# Patient Record
Sex: Female | Born: 1940
Health system: Southern US, Community
[De-identification: ages and names within clinical notes are randomized; demographics above are authoritative.]

## PROBLEM LIST (undated history)

## (undated) DIAGNOSIS — H269 Unspecified cataract: Secondary | ICD-10-CM

## (undated) DIAGNOSIS — H409 Unspecified glaucoma: Secondary | ICD-10-CM

## (undated) DIAGNOSIS — E039 Hypothyroidism, unspecified: Secondary | ICD-10-CM

## (undated) DIAGNOSIS — K52839 Microscopic colitis, unspecified: Secondary | ICD-10-CM

## (undated) DIAGNOSIS — K219 Gastro-esophageal reflux disease without esophagitis: Secondary | ICD-10-CM

## (undated) DIAGNOSIS — R42 Dizziness and giddiness: Secondary | ICD-10-CM

## (undated) DIAGNOSIS — R519 Headache, unspecified: Secondary | ICD-10-CM

## (undated) DIAGNOSIS — F419 Anxiety disorder, unspecified: Secondary | ICD-10-CM

## (undated) DIAGNOSIS — Z974 Presence of external hearing-aid: Secondary | ICD-10-CM

## (undated) DIAGNOSIS — K529 Noninfective gastroenteritis and colitis, unspecified: Secondary | ICD-10-CM

## (undated) DIAGNOSIS — R51 Headache: Secondary | ICD-10-CM

## (undated) HISTORY — PX: TONSILLECTOMY: SUR1361

## (undated) HISTORY — DX: Presence of external hearing-aid: Z97.4

## (undated) HISTORY — PX: CATARACT EXTRACTION: SUR2

## (undated) HISTORY — PX: KNEE SURGERY: SHX244

## (undated) HISTORY — PX: WRIST FRACTURE SURGERY: SHX121

## (undated) HISTORY — PX: WISDOM TOOTH EXTRACTION: SHX21

## (undated) HISTORY — DX: Unspecified glaucoma: H40.9

## (undated) HISTORY — PX: BIOPSY BREAST: PRO8

## (undated) HISTORY — PX: OTHER SURGICAL HISTORY: SHX169

## (undated) HISTORY — DX: Dizziness and giddiness: R42

## (undated) HISTORY — PX: SHOULDER ARTHROSCOPY: SHX128

## (undated) HISTORY — PX: ABDOMINAL HYSTERECTOMY: SHX81

## (undated) HISTORY — DX: Unspecified cataract: H26.9

---

## 1968-12-17 HISTORY — PX: OTHER SURGICAL HISTORY: SHX169

## 1998-09-14 ENCOUNTER — Ambulatory Visit (HOSPITAL_COMMUNITY): Admission: RE | Admit: 1998-09-14 | Discharge: 1998-09-14 | Payer: Self-pay | Admitting: Obstetrics & Gynecology

## 1999-01-13 ENCOUNTER — Other Ambulatory Visit: Admission: RE | Admit: 1999-01-13 | Discharge: 1999-01-13 | Payer: Self-pay | Admitting: Obstetrics and Gynecology

## 2000-02-06 ENCOUNTER — Other Ambulatory Visit: Admission: RE | Admit: 2000-02-06 | Discharge: 2000-02-06 | Payer: Self-pay | Admitting: Obstetrics and Gynecology

## 2000-11-28 ENCOUNTER — Encounter: Admission: RE | Admit: 2000-11-28 | Discharge: 2000-11-28 | Payer: Self-pay | Admitting: Obstetrics and Gynecology

## 2000-11-28 ENCOUNTER — Encounter: Payer: Self-pay | Admitting: Obstetrics and Gynecology

## 2001-03-07 ENCOUNTER — Other Ambulatory Visit: Admission: RE | Admit: 2001-03-07 | Discharge: 2001-03-07 | Payer: Self-pay | Admitting: Obstetrics and Gynecology

## 2001-04-18 ENCOUNTER — Encounter: Admission: RE | Admit: 2001-04-18 | Discharge: 2001-07-16 | Payer: Self-pay | Admitting: Internal Medicine

## 2001-09-23 ENCOUNTER — Other Ambulatory Visit: Admission: RE | Admit: 2001-09-23 | Discharge: 2001-09-23 | Payer: Self-pay | Admitting: Otolaryngology

## 2001-10-01 ENCOUNTER — Other Ambulatory Visit: Admission: RE | Admit: 2001-10-01 | Discharge: 2001-10-01 | Payer: Self-pay | Admitting: Otolaryngology

## 2001-10-13 ENCOUNTER — Encounter: Payer: Self-pay | Admitting: Otolaryngology

## 2001-10-13 ENCOUNTER — Encounter: Admission: RE | Admit: 2001-10-13 | Discharge: 2001-10-13 | Payer: Self-pay | Admitting: Otolaryngology

## 2002-03-27 ENCOUNTER — Other Ambulatory Visit: Admission: RE | Admit: 2002-03-27 | Discharge: 2002-03-27 | Payer: Self-pay | Admitting: Obstetrics and Gynecology

## 2003-05-28 ENCOUNTER — Other Ambulatory Visit: Admission: RE | Admit: 2003-05-28 | Discharge: 2003-05-28 | Payer: Self-pay | Admitting: Obstetrics and Gynecology

## 2003-09-06 ENCOUNTER — Ambulatory Visit (HOSPITAL_COMMUNITY): Admission: RE | Admit: 2003-09-06 | Discharge: 2003-09-06 | Payer: Self-pay | Admitting: Gastroenterology

## 2005-05-17 ENCOUNTER — Encounter: Admission: RE | Admit: 2005-05-17 | Discharge: 2005-05-17 | Payer: Self-pay | Admitting: Internal Medicine

## 2006-02-11 ENCOUNTER — Encounter: Admission: RE | Admit: 2006-02-11 | Discharge: 2006-02-11 | Payer: Self-pay | Admitting: Internal Medicine

## 2007-07-16 ENCOUNTER — Encounter: Admission: RE | Admit: 2007-07-16 | Discharge: 2007-07-16 | Payer: Self-pay | Admitting: Internal Medicine

## 2007-08-21 ENCOUNTER — Encounter: Admission: RE | Admit: 2007-08-21 | Discharge: 2007-08-21 | Payer: Self-pay | Admitting: Obstetrics and Gynecology

## 2009-04-19 ENCOUNTER — Encounter: Admission: RE | Admit: 2009-04-19 | Discharge: 2009-04-19 | Payer: Self-pay | Admitting: Internal Medicine

## 2010-09-28 ENCOUNTER — Encounter: Admission: RE | Admit: 2010-09-28 | Discharge: 2010-09-28 | Payer: Self-pay | Admitting: Neurosurgery

## 2010-10-17 HISTORY — PX: BACK SURGERY: SHX140

## 2010-10-24 ENCOUNTER — Inpatient Hospital Stay (HOSPITAL_COMMUNITY): Admission: RE | Admit: 2010-10-24 | Discharge: 2010-10-28 | Payer: Self-pay | Admitting: Neurosurgery

## 2011-01-07 ENCOUNTER — Encounter: Payer: Self-pay | Admitting: Neurology

## 2011-02-27 LAB — BASIC METABOLIC PANEL
BUN: 13 mg/dL (ref 6–23)
Chloride: 106 mEq/L (ref 96–112)
GFR calc Af Amer: 56 mL/min — ABNORMAL LOW (ref >60)
Potassium: 4.7 mEq/L (ref 3.5–5.1)
Sodium: 139 mEq/L (ref 135–145)

## 2011-02-27 LAB — ABO/RH: ABO/RH(D): AB POS

## 2011-02-27 LAB — CBC
HCT: 47.2 % — ABNORMAL HIGH (ref 36.0–46.0)
Hemoglobin: 16.2 g/dL — ABNORMAL HIGH (ref 12.0–15.0)
MCV: 95.9 fL (ref 78.0–100.0)
RBC: 4.92 MIL/uL (ref 3.87–5.11)
WBC: 5.7 10*3/uL (ref 4.0–10.5)

## 2011-02-27 LAB — TYPE AND SCREEN
ABO/RH(D): AB POS
Antibody Screen: NEGATIVE

## 2011-03-15 ENCOUNTER — Ambulatory Visit (HOSPITAL_COMMUNITY)
Admission: RE | Admit: 2011-03-15 | Discharge: 2011-03-15 | Disposition: A | Discharge status: Home / Self Care | Payer: Medicare Other | Source: Ambulatory Visit | Attending: Gastroenterology | Admitting: Gastroenterology

## 2011-03-15 DIAGNOSIS — K2960 Other gastritis without bleeding: Secondary | ICD-10-CM | POA: Insufficient documentation

## 2011-03-15 DIAGNOSIS — K222 Esophageal obstruction: Secondary | ICD-10-CM | POA: Insufficient documentation

## 2011-03-27 NOTE — Op Note (Signed)
  NAMECRISTINE, Kylie Christensen NO.:  Christensen  MEDICAL RECORD NO.:  Christensen           PATIENT TYPE:  O  LOCATION:  WLEN                         FACILITY:  [facility]  PHYSICIAN:  Danise Edge, M.D.   DATE OF BIRTH:  December 01, 1941  DATE OF PROCEDURE:  03/15/2011 DATE OF DISCHARGE:                              OPERATIVE REPORT   PROCEDURE:  Esophagogastroduodenoscopy with balloon dilation of a distal esophageal stricture.  REFERRING PHYSICIAN:  Juluis Mire, M.D.  HISTORY:  Kylie Christensen is a 70 year old female born [birth year].  In 1994, the patient underwent an esophagogastroduodenoscopy with Savary dilation of a benign peptic stricture at the esophagogastric junction.  The patient has developed intermittent esophageal dysphagia.  ENDOSCOPIST:  Danise Edge, M.D.  PREMEDICATIONS: 1. Fentanyl 50 mcg. 2. Versed 7.5 mg.  DESCRIPTION OF PROCEDURE:  After obtaining informed consent, the patient was placed in the left lateral decubitus position.  The Pentax gastroscope was passed through the posterior hypopharynx into the proximal esophagus without difficulty.  The hypopharynx, larynx and vocal cords appeared normal.  ESOPHAGOSCOPY:  The proximal, mid, and lower segments of the esophageal mucosa appeared normal except for the presence of a shallow stricture at the esophagogastric junction which was noted at 37 cm from the incisor teeth.  There was no endoscopic evidence for the presence of erosive esophagitis or Barrett's esophagus.  GASTROSCOPY:  Retroflex view of the gastric, cardia, and fundus was normal.  The diaphragmatic hiatus was quite patulous.  The gastric body appeared normal.  There were scattered circular erosions in the gastric antrum probably secondary to the use of aspirin.  The pylorus was normal.  DUODENOSCOPY:  The duodenal bulb and descending duodenum appeared normal.  ESOPHAGEAL DILATION:  Using the CRE esophageal balloon  dilator, the shallow peptic stricture at the esophagogastric junction was dilated to 16.5 mm.  ASSESSMENT: 1. Scattered circular erosions in the gastric antrum probably due to     aspirin use. 2. A shallow benign peptic stricture at the esophagogastric junction     dilated with the 16.5 mm CRE balloon dilator.          ______________________________ Danise Edge, M.D.     MJ/MEDQ  D:  03/15/2011  T:  03/15/2011  Job:  [id number]  cc:   Juluis Mire, M.D. Fax: [phone]  Candyce Churn, M.D. Fax: [phone]  Electronically Signed by Danise Edge M.D. on 03/27/2011 12:14:01 PM

## 2011-05-04 NOTE — Op Note (Signed)
NAMEARVIS, MIGUEZ                          ACCOUNT NO.:  [id number]   MEDICAL RECORD NO.:  [id number]                   PATIENT TYPE:  AMB   LOCATION:  ENDO                                 FACILITY:  [facility]   PHYSICIAN:  Danise Edge, M.D.                DATE OF BIRTH:  [birth year]   DATE OF PROCEDURE:  09/06/2003  DATE OF DISCHARGE:                                 OPERATIVE REPORT   PROCEDURE:  Esophagogastroduodenoscopy with Savary esophageal dilation.   PROCEDURE INDICATION:  Ms. Sonal Dorwart is a 70 year old female born  Jul 12, 1941.  Ms. Rolland is experiencing cervical esophageal dysphagia  with associated hoarseness.  She rarely experiences heartburn.  She has  undergone thyroid surgery to remove a goiter in the past.   ENDOSCOPIST:  Danise Edge, M.D.   PREMEDICATION:  Versed 5 mg, Demerol 50 mg.   DESCRIPTION OF PROCEDURE:  After obtaining informed consent, Ms. Krotz was  placed in the left lateral decubitus position.  I administered intravenous  Demerol and intravenous Versed to achieve conscious sedation for the  procedure.  The patient's blood pressure, oxygen saturation, and cardiac  rhythm were monitored throughout the procedure and documented in the medical  record.   The Olympus gastroscope was passed through the posterior hypopharynx into  the proximal esophagus without difficulty.  Visualization of the  hypopharynx, larynx, and vocal cords was normal by exam.   Esophagoscopy:  The proximal, mid-, and lower segments of the esophageal  mucosa appear completely normal.  There is no endoscopic evidence for the  presence of esophageal mucosal scarring, esophageal narrowing, or esophageal  obstruction.   Gastroscopy:  Retroflexed view of the gastric cardia and fundus was normal.  Endoscopically there is no hiatal hernia.  The gastric body appears normal.  There are scattered superficial erosions in the gastric antrum.  The pylorus  appears  normal.   Duodenoscopy:  The duodenal bulb, mid-duodenum, and distal duodenum appear  normal.   Savary esophageal dilation without fluoroscopy:  The Savary dilator wire was  passed through the endoscope and the tip of the guidewire advanced to the  distal gastric antrum as confirmed endoscopically.  The 14 mm Savary dilator  was passed without resistance.  Repeat esophagogastroscopy revealed a  minimal amount of blood at the esophagogastric junction, possibly  representing a dilated superficial stricture and no gastric trauma due to  the guidewire.    ASSESSMENT:  Normal esophagogastroduodenoscopy.  A 14 mm Savary dilator  passed without resistance; there may have been a superficial stricture at  the esophagogastric junction, which was dilated.                                               Danise Edge, M.D.    MJ/MEDQ  D:  09/06/2003  T:  09/06/2003  Job:  [id number]   cc:   Candyce Churn, M.D.  [address] [place]  [place] [address]  Fax: [phone]

## 2011-07-18 ENCOUNTER — Other Ambulatory Visit (HOSPITAL_COMMUNITY): Payer: Self-pay | Admitting: Neurosurgery

## 2011-07-18 DIAGNOSIS — M4310 Spondylolisthesis, site unspecified: Secondary | ICD-10-CM

## 2011-08-02 ENCOUNTER — Ambulatory Visit (HOSPITAL_COMMUNITY)
Admission: RE | Admit: 2011-08-02 | Discharge: 2011-08-02 | Disposition: A | Discharge status: Home / Self Care | Payer: Medicare Other | Source: Ambulatory Visit | Attending: Neurosurgery | Admitting: Neurosurgery

## 2011-08-02 DIAGNOSIS — M4310 Spondylolisthesis, site unspecified: Secondary | ICD-10-CM

## 2011-11-13 ENCOUNTER — Other Ambulatory Visit (HOSPITAL_COMMUNITY): Payer: Self-pay | Admitting: Neurosurgery

## 2011-11-13 DIAGNOSIS — M4310 Spondylolisthesis, site unspecified: Secondary | ICD-10-CM

## 2011-11-16 ENCOUNTER — Ambulatory Visit (HOSPITAL_COMMUNITY)
Admission: RE | Admit: 2011-11-16 | Discharge: 2011-11-16 | Disposition: A | Discharge status: Home / Self Care | Payer: Self-pay | Source: Ambulatory Visit | Attending: Neurosurgery | Admitting: Neurosurgery

## 2011-11-16 DIAGNOSIS — M5126 Other intervertebral disc displacement, lumbar region: Secondary | ICD-10-CM | POA: Insufficient documentation

## 2011-11-16 DIAGNOSIS — Z09 Encounter for follow-up examination after completed treatment for conditions other than malignant neoplasm: Secondary | ICD-10-CM | POA: Insufficient documentation

## 2011-11-16 DIAGNOSIS — M4310 Spondylolisthesis, site unspecified: Secondary | ICD-10-CM | POA: Insufficient documentation

## 2011-11-16 DIAGNOSIS — M519 Unspecified thoracic, thoracolumbar and lumbosacral intervertebral disc disorder: Secondary | ICD-10-CM | POA: Insufficient documentation

## 2012-10-29 ENCOUNTER — Other Ambulatory Visit (HOSPITAL_COMMUNITY)
Admission: RE | Admit: 2012-10-29 | Discharge: 2012-10-29 | Disposition: A | Discharge status: Home / Self Care | Payer: Medicare Other | Source: Ambulatory Visit | Attending: Internal Medicine | Admitting: Internal Medicine

## 2012-10-29 ENCOUNTER — Other Ambulatory Visit: Payer: Self-pay | Admitting: Internal Medicine

## 2012-10-29 DIAGNOSIS — Z1151 Encounter for screening for human papillomavirus (HPV): Secondary | ICD-10-CM | POA: Insufficient documentation

## 2012-10-29 DIAGNOSIS — Z124 Encounter for screening for malignant neoplasm of cervix: Secondary | ICD-10-CM | POA: Insufficient documentation

## 2013-02-16 DIAGNOSIS — H47012 Ischemic optic neuropathy, left eye: Secondary | ICD-10-CM | POA: Insufficient documentation

## 2013-02-16 DIAGNOSIS — H04129 Dry eye syndrome of unspecified lacrimal gland: Secondary | ICD-10-CM | POA: Diagnosis present

## 2014-12-23 DIAGNOSIS — J309 Allergic rhinitis, unspecified: Secondary | ICD-10-CM | POA: Diagnosis not present

## 2014-12-23 DIAGNOSIS — E559 Vitamin D deficiency, unspecified: Secondary | ICD-10-CM | POA: Diagnosis not present

## 2014-12-23 DIAGNOSIS — G43009 Migraine without aura, not intractable, without status migrainosus: Secondary | ICD-10-CM | POA: Diagnosis not present

## 2014-12-23 DIAGNOSIS — F419 Anxiety disorder, unspecified: Secondary | ICD-10-CM | POA: Diagnosis not present

## 2014-12-23 DIAGNOSIS — Z0001 Encounter for general adult medical examination with abnormal findings: Secondary | ICD-10-CM | POA: Diagnosis not present

## 2014-12-23 DIAGNOSIS — Z23 Encounter for immunization: Secondary | ICD-10-CM | POA: Diagnosis not present

## 2014-12-23 DIAGNOSIS — R21 Rash and other nonspecific skin eruption: Secondary | ICD-10-CM | POA: Diagnosis not present

## 2014-12-23 DIAGNOSIS — R195 Other fecal abnormalities: Secondary | ICD-10-CM | POA: Diagnosis not present

## 2015-01-05 DIAGNOSIS — R21 Rash and other nonspecific skin eruption: Secondary | ICD-10-CM | POA: Diagnosis not present

## 2015-01-05 DIAGNOSIS — R197 Diarrhea, unspecified: Secondary | ICD-10-CM | POA: Diagnosis not present

## 2015-01-05 DIAGNOSIS — E039 Hypothyroidism, unspecified: Secondary | ICD-10-CM | POA: Diagnosis not present

## 2015-01-05 DIAGNOSIS — E042 Nontoxic multinodular goiter: Secondary | ICD-10-CM | POA: Diagnosis not present

## 2015-01-19 ENCOUNTER — Other Ambulatory Visit: Payer: Self-pay | Admitting: Gastroenterology

## 2015-01-19 DIAGNOSIS — K529 Noninfective gastroenteritis and colitis, unspecified: Secondary | ICD-10-CM | POA: Diagnosis not present

## 2015-01-19 DIAGNOSIS — Z8719 Personal history of other diseases of the digestive system: Secondary | ICD-10-CM | POA: Diagnosis not present

## 2015-01-20 ENCOUNTER — Encounter (HOSPITAL_COMMUNITY): Payer: Self-pay | Admitting: *Deleted

## 2015-01-25 ENCOUNTER — Ambulatory Visit (HOSPITAL_COMMUNITY): Payer: Commercial Managed Care - HMO | Admitting: Anesthesiology

## 2015-01-25 ENCOUNTER — Encounter (HOSPITAL_COMMUNITY)
Admission: RE | Disposition: A | Discharge status: Home / Self Care | Payer: Self-pay | Source: Ambulatory Visit | Attending: Gastroenterology

## 2015-01-25 ENCOUNTER — Encounter (HOSPITAL_COMMUNITY): Payer: Self-pay | Admitting: Certified Registered"

## 2015-01-25 ENCOUNTER — Ambulatory Visit (HOSPITAL_COMMUNITY)
Admission: RE | Admit: 2015-01-25 | Discharge: 2015-01-25 | Disposition: A | Discharge status: Home / Self Care | Payer: Commercial Managed Care - HMO | Source: Ambulatory Visit | Attending: Gastroenterology | Admitting: Gastroenterology

## 2015-01-25 DIAGNOSIS — K449 Diaphragmatic hernia without obstruction or gangrene: Secondary | ICD-10-CM | POA: Insufficient documentation

## 2015-01-25 DIAGNOSIS — Z90710 Acquired absence of both cervix and uterus: Secondary | ICD-10-CM | POA: Diagnosis not present

## 2015-01-25 DIAGNOSIS — E039 Hypothyroidism, unspecified: Secondary | ICD-10-CM | POA: Diagnosis not present

## 2015-01-25 DIAGNOSIS — Z8719 Personal history of other diseases of the digestive system: Secondary | ICD-10-CM | POA: Diagnosis not present

## 2015-01-25 DIAGNOSIS — G43909 Migraine, unspecified, not intractable, without status migrainosus: Secondary | ICD-10-CM | POA: Diagnosis not present

## 2015-01-25 DIAGNOSIS — M1990 Unspecified osteoarthritis, unspecified site: Secondary | ICD-10-CM | POA: Diagnosis not present

## 2015-01-25 DIAGNOSIS — Z881 Allergy status to other antibiotic agents status: Secondary | ICD-10-CM | POA: Insufficient documentation

## 2015-01-25 DIAGNOSIS — K5289 Other specified noninfective gastroenteritis and colitis: Secondary | ICD-10-CM | POA: Diagnosis not present

## 2015-01-25 DIAGNOSIS — Z888 Allergy status to other drugs, medicaments and biological substances status: Secondary | ICD-10-CM | POA: Insufficient documentation

## 2015-01-25 DIAGNOSIS — Z981 Arthrodesis status: Secondary | ICD-10-CM | POA: Insufficient documentation

## 2015-01-25 DIAGNOSIS — K529 Noninfective gastroenteritis and colitis, unspecified: Secondary | ICD-10-CM | POA: Diagnosis not present

## 2015-01-25 DIAGNOSIS — K219 Gastro-esophageal reflux disease without esophagitis: Secondary | ICD-10-CM | POA: Diagnosis not present

## 2015-01-25 DIAGNOSIS — K222 Esophageal obstruction: Secondary | ICD-10-CM | POA: Insufficient documentation

## 2015-01-25 HISTORY — PX: COLONOSCOPY WITH PROPOFOL: SHX5780

## 2015-01-25 HISTORY — PX: BALLOON DILATION: SHX5330

## 2015-01-25 HISTORY — DX: Gastro-esophageal reflux disease without esophagitis: K21.9

## 2015-01-25 HISTORY — DX: Headache: R51

## 2015-01-25 HISTORY — DX: Headache, unspecified: R51.9

## 2015-01-25 HISTORY — PX: ESOPHAGOGASTRODUODENOSCOPY (EGD) WITH PROPOFOL: SHX5813

## 2015-01-25 HISTORY — DX: Anxiety disorder, unspecified: F41.9

## 2015-01-25 HISTORY — DX: Hypothyroidism, unspecified: E03.9

## 2015-01-25 SURGERY — COLONOSCOPY WITH PROPOFOL
Anesthesia: Monitor Anesthesia Care

## 2015-01-25 MED ORDER — SODIUM CHLORIDE 0.9 % IV SOLN: INTRAVENOUS | Status: DC

## 2015-01-25 MED ORDER — PROPOFOL 10 MG/ML IV BOLUS
INTRAVENOUS | Status: DC | PRN
  Administered 2015-01-25: 100 mg via INTRAVENOUS
  Administered 2015-01-25: 50 mg via INTRAVENOUS
  Administered 2015-01-25: 100 mg via INTRAVENOUS
  Administered 2015-01-25: 50 mg via INTRAVENOUS
  Administered 2015-01-25: 100 mg via INTRAVENOUS

## 2015-01-25 MED ORDER — PROPOFOL 10 MG/ML IV BOLUS
INTRAVENOUS | Status: AC
  Filled 2015-01-25: qty 20

## 2015-01-25 MED ORDER — LIDOCAINE HCL (PF) 2 % IJ SOLN
INTRAMUSCULAR | Status: DC | PRN
  Administered 2015-01-25: 20 mg via INTRADERMAL

## 2015-01-25 MED ORDER — LACTATED RINGERS IV SOLN
INTRAVENOUS | Status: DC | PRN
  Administered 2015-01-25: 11:00:00 via INTRAVENOUS

## 2015-01-25 MED ORDER — LIDOCAINE HCL (CARDIAC) 20 MG/ML IV SOLN
INTRAVENOUS | Status: AC
  Filled 2015-01-25: qty 5

## 2015-01-25 SURGICAL SUPPLY — 25 items

## 2015-01-25 NOTE — Op Note (Signed)
Problems: Chronic watery diarrhea. Benign distal esophageal stricture. Serologic screen for celiac disease normal. 03/15/2011 esophagogastroduodenoscopy with benign distal esophageal stricture dilation. 09/27/2008 normal colonoscopy with inspection of the terminal ileum and terminal ileal biopsies.  Endoscopist: Earle Gell  Premedication: Propofol administered by anesthesia  Procedure: Esophagogastroduodenoscopy. Balloon dilation of the benign stricture at the esophagogastric junction. Small bowel biopsies to look for villous atrophy associated with celiac disease. The patient was placed in the left lateral decubitus position. The Pentax gastroscope was passed through the posterior hypopharynx into the proximal esophagus without difficulty. The hypopharynx, larynx, and vocal cords appeared normal.  Esophagoscopy: The proximal and mid segments of the esophageal mucosa appeared normal. At the squamocolumnar junction noted at 37 cm from the incisor teeth was a benign distal esophageal stricture extending less than 1 cm in length. I was easily able to traverse the stricture with the Pentax gastroscope and intravenous stomach. The distal esophageal mucosa otherwise appeared normal. Using the esophageal balloon dilator, the stricture was dilated from 15 mm to 18 mm without apparent complications.  Gastroscopy: There was a moderate sized hiatal hernia. Retroflexed view of the gastric cardia and fundus was normal. The gastric body, antrum, and pylorus appeared normal.  Duodenoscopy: The duodenal bulb and descending duodenum appeared normal. Four biopsies were taken from the second portion of the duodenum and two biopsies were taken from the duodenal bulb to look for villous atrophy associated with celiac disease.  Assessment: Gastroesophageal reflux associated with a moderate-sized hiatal hernia and complicated by a benign stricture at the esophagogastric junction which was dilated from 15 mm to 18 mm  using the esophageal balloon dilator. Duodenal biopsies were performed to look for villous atrophy associated with celiac disease.  Procedure: Diagnostic colonoscopy with random colon biopsies to look for microscopic colitis Anal inspection and digital rectal exam were normal. The Pentax pediatric colonoscope was introduced into the rectum and advanced to the cecum. A normal-appearing appendiceal orifice and ileocecal valve were identified. Colonic preparation for the exam today was good. Withdrawal time was 9 minutes  Rectum. Normal. Retroflexed view of the distal rectum normal.  Sigmoid colon and descending colon. Normal  Splenic flexure. Normal  Transverse colon. Normal  Hepatic flexure. Normal  Ascending colon. Normal  Cecum and ileocecal valve. Normal  Biopsies: Four biopsies were taken from the ascending colon and two biopsies were taken from the descending colon to look for microscopic colitis  Assessment: Normal diagnostic colonoscopy. Random colon biopsies to look for microscopic colitis pending.

## 2015-01-25 NOTE — Anesthesia Preprocedure Evaluation (Signed)
Anesthesia Evaluation  Patient identified by MRN, date of birth, ID band Patient awake    Reviewed: Allergy & Precautions, NPO status , Patient's Chart, lab work & pertinent test results  Airway Mallampati: II  TM Distance: >3 FB Neck ROM: Full    Dental no notable dental hx.    Pulmonary neg pulmonary ROS,  breath sounds clear to auscultation  Pulmonary exam normal       Cardiovascular Exercise Tolerance: Good negative cardio ROS  Rhythm:Regular Rate:Normal     Neuro/Psych negative neurological ROS  negative psych ROS   GI/Hepatic negative GI ROS, Neg liver ROS,   Endo/Other  negative endocrine ROS  Renal/GU negative Renal ROS  negative genitourinary   Musculoskeletal negative musculoskeletal ROS (+)   Abdominal   Peds negative pediatric ROS (+)  Hematology negative hematology ROS (+)   Anesthesia Other Findings   Reproductive/Obstetrics negative OB ROS                             Anesthesia Physical Anesthesia Plan  ASA: II  Anesthesia Plan: MAC   Post-op Pain Management:    Induction: Intravenous  Airway Management Planned:   Additional Equipment:   Intra-op Plan:   Post-operative Plan:   Informed Consent: I have reviewed the patients History and Physical, chart, labs and discussed the procedure including the risks, benefits and alternatives for the proposed anesthesia with the patient or authorized representative who has indicated his/her understanding and acceptance.   Dental advisory given  Plan Discussed with: CRNA  Anesthesia Plan Comments:         Anesthesia Quick Evaluation

## 2015-01-25 NOTE — Discharge Instructions (Signed)
Colonoscopy, Care After °These instructions give you information on caring for yourself after your procedure. Your doctor may also give you more specific instructions. Call your doctor if you have any problems or questions after your procedure. °HOME CARE °· Do not drive for 24 hours. °· Do not sign important papers or use machinery for 24 hours. °· You may shower. °· You may go back to your usual activities, but go slower for the first 24 hours. °· Take rest breaks often during the first 24 hours. °· Walk around or use warm packs on your belly (abdomen) if you have belly cramping or gas. °· Drink enough fluids to keep your pee (urine) clear or pale yellow. °· Resume your normal diet. Avoid heavy or fried foods. °· Avoid drinking alcohol for 24 hours or as told by your doctor. °· Only take medicines as told by your doctor. °If a tissue sample (biopsy) was taken during the procedure:  °· Do not take aspirin or blood thinners for 7 days, or as told by your doctor. °· Do not drink alcohol for 7 days, or as told by your doctor. °· Eat soft foods for the first 24 hours. °GET HELP IF: °You still have a small amount of blood in your poop (stool) 2-3 days after the procedure. °GET HELP RIGHT AWAY IF: °· You have more than a small amount of blood in your poop. °· You see clumps of tissue (blood clots) in your poop. °· Your belly is puffy (swollen). °· You feel sick to your stomach (nauseous) or throw up (vomit). °· You have a fever. °· You have belly pain that gets worse and medicine does not help. °MAKE SURE YOU: °· Understand these instructions. °· Will watch your condition. °· Will get help right away if you are not doing well or get worse. °Document Released: 01/05/2011 Document Revised: 12/08/2013 Document Reviewed: 08/10/2013 °Exit[employer]® Patient Information ©2015 [employer]. This information is not intended to replace advice given to you by your health care provider. Make sure you discuss any questions you have with  your health care provider. ° ° °Esophagogastroduodenoscopy °Care After °Refer to this sheet in the next few weeks. These instructions provide you with information on caring for yourself after your procedure. Your caregiver may also give you more specific instructions. Your treatment has been planned according to current medical practices, but problems sometimes occur. Call your caregiver if you have any problems or questions after your procedure.  °HOME CARE INSTRUCTIONS °· Do not eat or drink anything until the numbing medicine (local anesthetic) has worn off and your gag reflex has returned. You will know that the local anesthetic has worn off when you can swallow comfortably. °· Do not drive for 12 hours after the procedure or as directed by your caregiver. °· Only take medicines as directed by your caregiver. °SEEK MEDICAL CARE IF:  °· You cannot stop coughing. °· You are not urinating at all or less than usual. °SEEK IMMEDIATE MEDICAL CARE IF: °· You have difficulty swallowing. °· You cannot eat or drink. °· You have worsening throat or chest pain. °· You have dizziness, lightheadedness, or you faint. °· You have nausea or vomiting. °· You have chills. °· You have a fever. °· You have severe abdominal pain. °· You have black, tarry, or bloody stools. °Document Released: 11/19/2012 Document Reviewed: 11/19/2012 °[employer]® Patient Information ©2015 [employer]. This information is not intended to replace advice given to you by your health care provider. Make   sure you discuss any questions you have with your health care provider. ° ° °Conscious Sedation, Adult, Care After °Refer to this sheet in the next few weeks. These instructions provide you with information on caring for yourself after your procedure. Your health care provider may also give you more specific instructions. Your treatment has been planned according to current medical practices, but problems sometimes occur. Call your health care provider if  you have any problems or questions after your procedure. °WHAT TO EXPECT AFTER THE PROCEDURE  °After your procedure: °· You may feel sleepy, clumsy, and have poor balance for several hours. °· Vomiting may occur if you eat too soon after the procedure. °HOME CARE INSTRUCTIONS °· Do not participate in any activities where you could become injured for at least 24 hours. Do not: °¨ Drive. °¨ Swim. °¨ Ride a bicycle. °¨ Operate heavy machinery. °¨ Cook. °¨ Use power tools. °¨ Climb ladders. °¨ Work from a high place. °· Do not make important decisions or sign legal documents until you are improved. °· If you vomit, drink water, juice, or soup when you can drink without vomiting. Make sure you have little or no nausea before eating solid foods. °· Only take over-the-counter or prescription medicines for pain, discomfort, or fever as directed by your health care provider. °· Make sure you and your family fully understand everything about the medicines given to you, including what side effects may occur. °· You should not drink alcohol, take sleeping pills, or take medicines that cause drowsiness for at least 24 hours. °· If you smoke, do not smoke without supervision. °· If you are feeling better, you may resume normal activities 24 hours after you were sedated. °· Keep all appointments with your health care provider. °SEEK MEDICAL CARE IF: °· Your skin is pale or bluish in color. °· You continue to feel nauseous or vomit. °· Your pain is getting worse and is not helped by medicine. °· You have bleeding or swelling. °· You are still sleepy or feeling clumsy after 24 hours. °SEEK IMMEDIATE MEDICAL CARE IF: °· You develop a rash. °· You have difficulty breathing. °· You develop any type of allergic problem. °· You have a fever. °MAKE SURE YOU: °· Understand these instructions. °· Will watch your condition. °· Will get help right away if you are not doing well or get worse. °Document Released: 09/23/2013 Document Reviewed:  09/23/2013 °ExitCare® Patient Information ©2015 [employer]. This information is not intended to replace advice given to you by your health care provider. Make sure you discuss any questions you have with your health care provider. ° °

## 2015-01-25 NOTE — Transfer of Care (Signed)
Immediate Anesthesia Transfer of Care Note  Patient: Kylie Christensen  Procedure(s) Performed: Procedure(s) (LRB): COLONOSCOPY WITH PROPOFOL (N/A) ESOPHAGOGASTRODUODENOSCOPY (EGD) WITH PROPOFOL (N/A) BALLOON DILATION (N/A)  Patient Location: PACU  Anesthesia Type: MAC  Level of Consciousness: sedated, patient cooperative and responds to stimulation  Airway & Oxygen Therapy: Patient Spontanous Breathing and Patient connected to face mask oxgen  Post-op Assessment: Report given to PACU RN and Post -op Vital signs reviewed and stable  Post vital signs: Reviewed and stable  Complications: No apparent anesthesia complications

## 2015-01-25 NOTE — Anesthesia Postprocedure Evaluation (Signed)
  Anesthesia Post-op Note  Patient: Kylie Christensen  Procedure(s) Performed: Procedure(s) (LRB): COLONOSCOPY WITH PROPOFOL (N/A) ESOPHAGOGASTRODUODENOSCOPY (EGD) WITH PROPOFOL (N/A) BALLOON DILATION (N/A)  Patient Location: PACU  Anesthesia Type: MAC  Level of Consciousness: awake and alert   Airway and Oxygen Therapy: Patient Spontanous Breathing  Post-op Pain: mild  Post-op Assessment: Post-op Vital signs reviewed, Patient's Cardiovascular Status Stable, Respiratory Function Stable, Patent Airway and No signs of Nausea or vomiting  Last Vitals:  Filed Vitals:   01/25/15 1140  BP: 129/73  Pulse: 61  Temp:   Resp: 14    Post-op Vital Signs: stable   Complications: No apparent anesthesia complications

## 2015-01-25 NOTE — H&P (Signed)
  Problems: Chronic watery diarrhea on Prilosec and Prozac unassociated with weight loss. Serologic screen for celiac disease normal. Benign esophageal stricture. 03/15/2011 esophagogastroduodenoscopy with esophageal stricture dilation. 09/27/2008 normal colonoscopy with terminal ileum inspection and terminal ileal biopsies. PPI therapy and SSRI therapy are associated with microscopic colitis  History: The patient is a 74 year old female born 15-Apr-1941. Her mother was diagnosed with Zollinger-Ellison syndrome. Her sister was diagnosed with celiac disease. The patient underwent a normal colonoscopy with terminal ileal biopsies in October 2009. She underwent an esophagogastroduodenoscopy with dilation of a benign distal esophageal stricture in March 2012.  Within the past year, the patient required prolonged antibiotic therapy to treat an eye  infection and received antibiotic therapy for a urinary tract infection. Stool screen for C. difficile toxin was negative.  Starting in the summer of 2015, the patient developed nonbloody diarrhea unassociated with abdominal pain or weight loss. Prior to the onset of diarrhea, the patient would have a single formed bowel movement daily. Currently, the patient experiences postprandial borborygmi followed by the passage of frequent watery diarrhea unassociated with gastrointestinal bleeding. She has been experiencing anorexia unassociated with weight loss.  Her serologic screen for celiac disease was negative. She stopped taking proton pump inhibitor medication which did not result in  heartburn and did not decrease her diarrheal episodes. She continues taking Prozac which is also associated with microscopic colitis.  The patient is scheduled to undergo esophagogastroduodenoscopy with esophageal stricture dilation and small bowel biopsies to screen for celiac disease followed by diagnostic colonoscopy with random colon biopsies to look for microscopic  colitis.  Medication allergies: Cytomel. Effexor . Amoxicillin.  Past medical history: Gastroesophageal reflux. Multinodular goiter. Allergic rhinitis. Osteoarthritis. Migraine headache syndrome. Hypothyroidism. Insomnia. History of left optic neuritis. Thyroid nodule surgery. Left breast lumpectomy. Hysterectomy. Right rotator cuff surgery. Urethral dilation. Lumbar spine fusion surgery.  Exam: The patient is alert and lying comfortably on the endoscopy stretcher. Abdomen is soft and nontender to palpation. Lungs are clear to auscultation. Cardiac exam reveals a regular rhythm.  Plan: Proceed with esophagogastroduodenoscopy, benign esophageal stricture dilation, duodenal biopsies to screen for celiac disease, colonoscopy, and random colon biopsies to look for microscopic colitis.

## 2015-01-26 ENCOUNTER — Encounter (HOSPITAL_COMMUNITY): Payer: Self-pay | Admitting: Gastroenterology

## 2015-02-09 DIAGNOSIS — H2511 Age-related nuclear cataract, right eye: Secondary | ICD-10-CM | POA: Diagnosis not present

## 2015-02-09 DIAGNOSIS — Z961 Presence of intraocular lens: Secondary | ICD-10-CM | POA: Diagnosis not present

## 2015-02-09 DIAGNOSIS — H47012 Ischemic optic neuropathy, left eye: Secondary | ICD-10-CM | POA: Diagnosis not present

## 2015-02-09 DIAGNOSIS — H04123 Dry eye syndrome of bilateral lacrimal glands: Secondary | ICD-10-CM | POA: Diagnosis not present

## 2015-02-16 DIAGNOSIS — L219 Seborrheic dermatitis, unspecified: Secondary | ICD-10-CM | POA: Diagnosis not present

## 2015-02-16 DIAGNOSIS — L259 Unspecified contact dermatitis, unspecified cause: Secondary | ICD-10-CM | POA: Diagnosis not present

## 2015-02-16 DIAGNOSIS — L308 Other specified dermatitis: Secondary | ICD-10-CM | POA: Diagnosis not present

## 2015-03-28 DIAGNOSIS — G43009 Migraine without aura, not intractable, without status migrainosus: Secondary | ICD-10-CM | POA: Diagnosis not present

## 2015-03-28 DIAGNOSIS — L309 Dermatitis, unspecified: Secondary | ICD-10-CM | POA: Diagnosis not present

## 2015-03-28 DIAGNOSIS — K219 Gastro-esophageal reflux disease without esophagitis: Secondary | ICD-10-CM | POA: Diagnosis not present

## 2015-03-28 DIAGNOSIS — E559 Vitamin D deficiency, unspecified: Secondary | ICD-10-CM | POA: Diagnosis not present

## 2015-03-28 DIAGNOSIS — G4710 Hypersomnia, unspecified: Secondary | ICD-10-CM | POA: Diagnosis not present

## 2015-03-28 DIAGNOSIS — M179 Osteoarthritis of knee, unspecified: Secondary | ICD-10-CM | POA: Diagnosis not present

## 2015-03-28 DIAGNOSIS — F4320 Adjustment disorder, unspecified: Secondary | ICD-10-CM | POA: Diagnosis not present

## 2015-03-28 DIAGNOSIS — J309 Allergic rhinitis, unspecified: Secondary | ICD-10-CM | POA: Diagnosis not present

## 2015-04-22 DIAGNOSIS — R319 Hematuria, unspecified: Secondary | ICD-10-CM | POA: Diagnosis not present

## 2015-04-23 ENCOUNTER — Emergency Department (HOSPITAL_BASED_OUTPATIENT_CLINIC_OR_DEPARTMENT_OTHER): Payer: Commercial Managed Care - HMO

## 2015-04-23 ENCOUNTER — Emergency Department (HOSPITAL_BASED_OUTPATIENT_CLINIC_OR_DEPARTMENT_OTHER)
Admission: EM | Admit: 2015-04-23 | Discharge: 2015-04-23 | Disposition: A | Discharge status: Home / Self Care | Payer: Commercial Managed Care - HMO | Attending: Emergency Medicine | Admitting: Emergency Medicine

## 2015-04-23 ENCOUNTER — Encounter (HOSPITAL_BASED_OUTPATIENT_CLINIC_OR_DEPARTMENT_OTHER): Payer: Self-pay

## 2015-04-23 DIAGNOSIS — S0990XA Unspecified injury of head, initial encounter: Secondary | ICD-10-CM | POA: Insufficient documentation

## 2015-04-23 DIAGNOSIS — M79642 Pain in left hand: Secondary | ICD-10-CM | POA: Diagnosis not present

## 2015-04-23 DIAGNOSIS — Y9289 Other specified places as the place of occurrence of the external cause: Secondary | ICD-10-CM | POA: Insufficient documentation

## 2015-04-23 DIAGNOSIS — Z792 Long term (current) use of antibiotics: Secondary | ICD-10-CM | POA: Diagnosis not present

## 2015-04-23 DIAGNOSIS — E039 Hypothyroidism, unspecified: Secondary | ICD-10-CM | POA: Diagnosis not present

## 2015-04-23 DIAGNOSIS — Z79899 Other long term (current) drug therapy: Secondary | ICD-10-CM | POA: Diagnosis not present

## 2015-04-23 DIAGNOSIS — S0181XA Laceration without foreign body of other part of head, initial encounter: Secondary | ICD-10-CM | POA: Diagnosis not present

## 2015-04-23 DIAGNOSIS — S82042A Displaced comminuted fracture of left patella, initial encounter for closed fracture: Secondary | ICD-10-CM | POA: Diagnosis not present

## 2015-04-23 DIAGNOSIS — W01198A Fall on same level from slipping, tripping and stumbling with subsequent striking against other object, initial encounter: Secondary | ICD-10-CM | POA: Insufficient documentation

## 2015-04-23 DIAGNOSIS — S01511A Laceration without foreign body of lip, initial encounter: Secondary | ICD-10-CM | POA: Diagnosis not present

## 2015-04-23 DIAGNOSIS — Y998 Other external cause status: Secondary | ICD-10-CM | POA: Insufficient documentation

## 2015-04-23 DIAGNOSIS — Z880 Allergy status to penicillin: Secondary | ICD-10-CM | POA: Diagnosis not present

## 2015-04-23 DIAGNOSIS — S6992XA Unspecified injury of left wrist, hand and finger(s), initial encounter: Secondary | ICD-10-CM | POA: Diagnosis not present

## 2015-04-23 DIAGNOSIS — Z8719 Personal history of other diseases of the digestive system: Secondary | ICD-10-CM | POA: Diagnosis not present

## 2015-04-23 DIAGNOSIS — M79644 Pain in right finger(s): Secondary | ICD-10-CM | POA: Diagnosis not present

## 2015-04-23 DIAGNOSIS — S82002A Unspecified fracture of left patella, initial encounter for closed fracture: Secondary | ICD-10-CM | POA: Insufficient documentation

## 2015-04-23 DIAGNOSIS — S199XXA Unspecified injury of neck, initial encounter: Secondary | ICD-10-CM | POA: Diagnosis not present

## 2015-04-23 DIAGNOSIS — S8992XA Unspecified injury of left lower leg, initial encounter: Secondary | ICD-10-CM | POA: Diagnosis present

## 2015-04-23 DIAGNOSIS — M79641 Pain in right hand: Secondary | ICD-10-CM | POA: Diagnosis not present

## 2015-04-23 DIAGNOSIS — S6991XA Unspecified injury of right wrist, hand and finger(s), initial encounter: Secondary | ICD-10-CM | POA: Diagnosis not present

## 2015-04-23 DIAGNOSIS — M79645 Pain in left finger(s): Secondary | ICD-10-CM | POA: Diagnosis not present

## 2015-04-23 DIAGNOSIS — W19XXXA Unspecified fall, initial encounter: Secondary | ICD-10-CM

## 2015-04-23 DIAGNOSIS — F419 Anxiety disorder, unspecified: Secondary | ICD-10-CM | POA: Diagnosis not present

## 2015-04-23 DIAGNOSIS — Y9302 Activity, running: Secondary | ICD-10-CM | POA: Insufficient documentation

## 2015-04-23 HISTORY — DX: Noninfective gastroenteritis and colitis, unspecified: K52.9

## 2015-04-23 MED ORDER — ONDANSETRON 4 MG PO TBDP
ORAL_TABLET | ORAL | Status: AC
  Filled 2015-04-23: qty 1

## 2015-04-23 MED ORDER — HYDROCODONE-ACETAMINOPHEN 5-325 MG PO TABS: 1.0000 | ORAL_TABLET | Freq: Four times a day (QID) | ORAL | Status: DC | PRN

## 2015-04-23 MED ORDER — LIDOCAINE-EPINEPHRINE (PF) 2 %-1:200000 IJ SOLN
10.0000 mL | Freq: Once | INTRAMUSCULAR | Status: DC
  Filled 2015-04-23: qty 10

## 2015-04-23 MED ORDER — HYDROCODONE-ACETAMINOPHEN 5-325 MG PO TABS
1.0000 | ORAL_TABLET | Freq: Once | ORAL | Status: AC
  Administered 2015-04-23: 1 via ORAL
  Filled 2015-04-23: qty 1

## 2015-04-23 MED ORDER — ONDANSETRON HCL 8 MG PO TABS
4.0000 mg | ORAL_TABLET | Freq: Once | ORAL | Status: AC
  Administered 2015-04-23: 4 mg via ORAL

## 2015-04-23 NOTE — ED Notes (Signed)
Pt reports was running to restroom in store and fell when she got in restroom, mechanical fall onto L side, hit R side of face on door.  Pain mostly in L knee and face.  Has had severe nausea since this time.  No loc, not on blood thinners.

## 2015-04-23 NOTE — ED Notes (Signed)
Patient transported to X-ray via wheelchair per tech.

## 2015-04-23 NOTE — ED Provider Notes (Signed)
CSN: [id number]     Arrival date & time 04/23/15  1222 History   First MD Initiated Contact with Patient 04/23/15 1318     Chief Complaint  Patient presents with  . Fall     (Consider location/radiation/quality/duration/timing/severity/associated sxs/prior Treatment) HPI  Kylie Christensen is a 74 y.o. female with PMH of hypothyroidism, GERD, headache, anxiety, colitis presenting with fall and running to the restroom. Patient stated it was witnessed and she did not loss consciousness. She slipped and fell on her left side and hit her right face and fell mostly on her left knee. Patient endorses nausea but no vomiting. No headache or visual changes, slurred speech, weakness. No neck pain. Pain to left knee worse with movement. She had associated swelling. She has not taken anything for her symptoms. Pt states her tetanus is up-to-date last 2-3 years. No blood thinners no neck pain. Pt reports bilateral hand pain where she caught herself.    Past Medical History  Diagnosis Date  . Hypothyroidism   . Anxiety   . GERD (gastroesophageal reflux disease)   . Headache   . Colitis    Past Surgical History  Procedure Laterality Date  . Back surgery  nov 2011    lower back  . Thyroid nodule removed  1970  . Abdominal hysterectomy      partial  . Tonsillectomy  age 80  . Biopsy breast Left yrs ago    benign  . Rotator cuffr Right   . Colonoscopy with propofol N/A 01/25/2015    Procedure: COLONOSCOPY WITH PROPOFOL;  Surgeon: Garlan Fair, MD;  Location: [facility];  Service: Endoscopy;  Laterality: N/A;  . Esophagogastroduodenoscopy (egd) with propofol N/A 01/25/2015    Procedure: ESOPHAGOGASTRODUODENOSCOPY (EGD) WITH PROPOFOL;  Surgeon: Garlan Fair, MD;  Location: [facility];  Service: Endoscopy;  Laterality: N/A;  . Balloon dilation N/A 01/25/2015    Procedure: BALLOON DILATION;  Surgeon: Garlan Fair, MD;  Location: [facility];  Service: Endoscopy;  Laterality: N/A;   No  family history on file. History  Substance Use Topics  . Smoking status: Never Smoker   . Smokeless tobacco: Never Used  . Alcohol Use: Yes     Comment: occasional   OB History    No data available     Review of Systems 10 Systems reviewed and are negative for acute change except as noted in the HPI.    Allergies  Amoxicillin  Home Medications   Prior to Admission medications   Medication Sig Start Date End Date Taking? Authorizing Provider  cycloSPORINE (RESTASIS) 0.05 % ophthalmic emulsion 1 drop 2 (two) times daily.   Yes Historical Provider, MD  doxycycline (DORYX) 100 MG EC tablet Take 100 mg by mouth 2 (two) times daily.   Yes Historical Provider, MD  Estradiol 10 MCG TABS vaginal tablet Place 10 mcg vaginally 2 (two) times a week.    Historical Provider, MD  FLUoxetine (PROZAC) 20 MG capsule Take 20 mg by mouth daily.    Historical Provider, MD  HYDROcodone-acetaminophen (NORCO/VICODIN) 5-325 MG per tablet Take 1-2 tablets by mouth every 6 (six) hours as needed. 04/23/15   Al Corpus, PA-C  levothyroxine (SYNTHROID, LEVOTHROID) 50 MCG tablet Take 25-50 mcg by mouth every other day. Alternates between 24mcg and 50 mcg. Brand name only    Historical Provider, MD  Polyvinyl Alcohol-Povidone (REFRESH OP) Apply 1 drop to eye 3 (three) times daily as needed (Dry eyes).    Historical Provider, MD  Skin Protectants, Misc. (EUCERIN) cream Apply 1 application topically daily as needed for dry skin.    Historical Provider, MD  tazarotene (TAZORAC) 0.05 % cream Apply 1 application topically daily.    Historical Provider, MD   BP 128/71 mmHg  Pulse 74  Temp(Src) 98.5 F (36.9 C) (Oral)  Resp 18  Ht 5' 3.5" (1.613 m)  Wt 179 lb (81.194 kg)  BMI 31.21 kg/m2  SpO2 98% Physical Exam  Constitutional: She appears well-developed and well-nourished. No distress.  HENT:  Head: Normocephalic.  Mouth/Throat: Oropharynx is clear and moist.  0.5 cm to left lower chin without  involvement of [place] border. Laceration is not through and through. 1 cm laceration to inner mid lower lip  Eyes: Conjunctivae and EOM are normal. Pupils are equal, round, and reactive to light. Right eye exhibits no discharge. Left eye exhibits no discharge.  Neck: Normal range of motion. Neck supple.  No nuchal rigidity  Cardiovascular: Normal rate and regular rhythm.   Pulmonary/Chest: Effort normal and breath sounds normal. No respiratory distress. She has no wheezes.  Abdominal: Soft. Bowel sounds are normal. She exhibits no distension. There is no tenderness.  Musculoskeletal:  Left knee without wound or lesions with ecchymoses and diffuse tenderness with significant effusion. Patient with intact leg extension. Pt moves all toes without difficulty. No cyanosis, pallor or cold extremity. No firm compartments.  Neurological: She is alert. No cranial nerve deficit. Coordination normal.  Speech is clear and goal oriented. Peripheral visual fields intact. Strength 5/5 in upper and lower extremities. Sensation intact. Intact rapid alternating movements, finger to nose. Gait not performed due to knee.  Skin: Skin is warm and dry. She is not diaphoretic.  Nursing note and vitals reviewed.   ED Course  Procedures (including critical care time) Labs Review Labs Reviewed - No data to display  Imaging Review Ct Head Wo Contrast  04/23/2015   CLINICAL DATA:  [place] in a store restroom today onto her left side. Severe nausea since the fall. The patient hit the right side her of her face on the door.  EXAM: CT HEAD WITHOUT CONTRAST  CT CERVICAL SPINE WITHOUT CONTRAST  TECHNIQUE: Multidetector CT imaging of the head and cervical spine was performed following the standard protocol without intravenous contrast. Multiplanar CT image reconstructions of the cervical spine were also generated.  COMPARISON:  None.  FINDINGS: CT HEAD FINDINGS  Diffusely enlarged ventricles and subarachnoid spaces. Patchy white  matter low density in both cerebral hemispheres. No skull fracture, intracranial hemorrhage or paranasal sinus air-fluid levels. Mild bilateral posterior ethmoid sinus mucosal thickening.  CT CERVICAL SPINE FINDINGS  Multilevel degenerative changes. No prevertebral soft tissue swelling, fractures or subluxations.  IMPRESSION: 1. No skull fracture or intracranial hemorrhage. 2. No cervical spine fracture or subluxation. 3. Mild to moderate diffuse cerebral and cerebellar atrophy. 4. Mild chronic small vessel white matter ischemic changes in both cerebral hemispheres. 5. Multilevel cervical spine degenerative changes.   Electronically Signed   By: Claudie Revering M.D.   On: 04/23/2015 15:26   Ct Cervical Spine Wo Contrast  04/23/2015   CLINICAL DATA:  [place] in a store restroom today onto her left side. Severe nausea since the fall. The patient hit the right side her of her face on the door.  EXAM: CT HEAD WITHOUT CONTRAST  CT CERVICAL SPINE WITHOUT CONTRAST  TECHNIQUE: Multidetector CT imaging of the head and cervical spine was performed following the standard protocol without intravenous contrast. Multiplanar CT image reconstructions  of the cervical spine were also generated.  COMPARISON:  None.  FINDINGS: CT HEAD FINDINGS  Diffusely enlarged ventricles and subarachnoid spaces. Patchy white matter low density in both cerebral hemispheres. No skull fracture, intracranial hemorrhage or paranasal sinus air-fluid levels. Mild bilateral posterior ethmoid sinus mucosal thickening.  CT CERVICAL SPINE FINDINGS  Multilevel degenerative changes. No prevertebral soft tissue swelling, fractures or subluxations.  IMPRESSION: 1. No skull fracture or intracranial hemorrhage. 2. No cervical spine fracture or subluxation. 3. Mild to moderate diffuse cerebral and cerebellar atrophy. 4. Mild chronic small vessel white matter ischemic changes in both cerebral hemispheres. 5. Multilevel cervical spine degenerative changes.   Electronically  Signed   By: Claudie Revering M.D.   On: 04/23/2015 15:26   Dg Knee Complete 4 Views Left  04/23/2015   CLINICAL DATA:  Left knee pain after falling on the knee today when slipping on the tile floor of a bathroom at [employer].  EXAM: LEFT KNEE - COMPLETE 4+ VIEW  COMPARISON:  None.  FINDINGS: Transverse fracture of the mid patella. This is mildly comminuted with 1/2 patellar with of anterior displacement and posterior angulation of the distal fragment. There is an associated moderately large effusion. Mild medial joint space narrowing and associated spur formation. Minimal posterior patellar and lateral spur formation.  IMPRESSION: 1. Patellar fracture, as described above. 2. Associated moderately large effusion. 3. Degenerative changes.   Electronically Signed   By: Claudie Revering M.D.   On: 04/23/2015 13:07   Dg Hand Complete Left  04/23/2015   CLINICAL DATA:  [place] today, braced herself with her LEFT hand, pain near base of thumb, initial encounter  EXAM: LEFT HAND - COMPLETE 3+ VIEW  COMPARISON:  None  FINDINGS: Diffuse osseous demineralization.  Mild degenerative changes at first [facility] joint with joint space narrowing spur formation and subchondral cyst formation.  Scattered degenerative changes at interphalangeal joint most prominently at DIP joints of index and middle fingers and at IP joint thumb.  No acute fracture, dislocation, or bone destruction.  IMPRESSION: Scattered degenerative changes as above.  No acute abnormalities.   Electronically Signed   By: Lavonia Dana M.D.   On: 04/23/2015 14:54   Dg Hand Complete Right  04/23/2015   CLINICAL DATA:  [place] today and braced herself with her RIGHT hand, thumb pain, initial encounter  EXAM: RIGHT HAND - COMPLETE 3+ VIEW  COMPARISON:  None  FINDINGS: Diffuse osseous demineralization.  Mild degenerative changes first [facility] joint with joint space narrowing and spur formation.  Scattered degenerative changes at DIP joints of RIGHT index, middle and ring fingers as well as at  IP joint of thumb.  No acute fracture, dislocation or bone destruction.  IMPRESSION: Scattered degenerative changes.  No acute abnormalities.   Electronically Signed   By: Lavonia Dana M.D.   On: 04/23/2015 14:52     EKG Interpretation None      LACERATION REPAIR Performed by: Henli Hey Authorized by: Al Corpus Consent: Verbal consent obtained. Risks and benefits: risks, benefits and alternatives were discussed Consent given by: patient Patient identity confirmed: provided demographic data Prepped and Draped in normal sterile fashion Wound explored  Laceration Location: chin and inner mid lower lip  Laceration Length: 1cm  No Foreign Bodies seen or palpated  Anesthesia: local infiltration  Local anesthetic: lidocaine 2% with epinephrine  Anesthetic total: 4 ml  Irrigation method: syringe Amount of cleaning: standard  Skin closure: 5-0 vicryl rapide for inner lip (2 sutures). 5-0 proline for  outer chin (one suture)  Technique: simple interrupted  Patient tolerance: Patient tolerated the procedure well with no immediate complications.  Meds given in ED:  Medications  ondansetron (ZOFRAN) tablet 4 mg (4 mg Oral Given 04/23/15 1310)  ondansetron (ZOFRAN-ODT) 4 MG disintegrating tablet (  Duplicate 07/20/65 5993)  HYDROcodone-acetaminophen (NORCO/VICODIN) 5-325 MG per tablet 1 tablet (1 tablet Oral Given 04/23/15 1419)    Discharge Medication List as of 04/23/2015  4:11 PM    START taking these medications   Details  HYDROcodone-acetaminophen (NORCO/VICODIN) 5-325 MG per tablet Take 1-2 tablets by mouth every 6 (six) hours as needed., Starting 04/23/2015, Until Discontinued, Print          MDM   Final diagnoses:  Fall  Lip laceration, initial encounter  Head injury, initial encounter  Patella fracture, left, closed, initial encounter  Bilateral hand pain   Patient with mechanical fall with head injury and laceration to chin and inner lip as well as  bilateral hand pain and left knee pain. VSS. Patient's pain managed in the ED. Patient with patella fracture of left with displacement and angulation. Patient neurovascularly intact with intact extension of lower extremity. Patient placed in knee immobilizer and crutches ordered. CT head and neck without acute abnormalities. Patient denies any headache. Normal neurological exam. Patient's lip laceration repaired without complications. Patient states tetanus is up-to-date. Laceration was not through and through. Discussed soft diet as well as salt water gargles. Patient to follow-up with orthopedics for patella fracture. Norco provided. Driving and sedation precautions provided. Pt to return to ED/urgent care/PCP for wound recheck of laceration in 5 days.  Discussed return precautions with patient including infection and compartment syndrome. Discussed all results and patient verbalizes understanding and agrees with plan.     Al Corpus, PA-C 04/23/15 1951  Varney Biles, MD 04/30/15 [phone]

## 2015-04-23 NOTE — Discharge Instructions (Signed)
Return to the emergency room with worsening of symptoms, new symptoms or with symptoms that are concerning , especially severe worsening of headache, visual or speech changes, weakness in face, arms or legs OR warmth, redness, tenderness, pus, sharp increase in pain, fever, red streaking in the skin, OR unable to move left foot, blue, cold toes. Call to make follow up appointment as soon as possible with orthopedics. No weight bearing until evaluated by orthopedics. Wear the knee immobilizer at all times.  RICE: Rest, Ice (three cycles of 20 mins on, 41mins off at least twice a day), compression/brace, elevation. Heating pad works well for back pain. Norco for severe pain. Do not operate machinery, drive or drink alcohol while taking narcotics or muscle relaxers. Eat soft foods for first week. Salt water gargles after eating.  Keep wound dry and do not remove dressing for 24 hours if possible. After that, wash gently morning and night (every 12 hours) with soap and water. Use a topical antibiotic ointment and cover with a bandaid or gauze.    Do NOT use rubbing alcohol or hydrogen peroxide, do not soak the area   Present to your primary care doctor or the urgent care of your choice, or the ED for suture removal in 7-10 days.   Every attempt was made to remove foreign body (contaminants) from the wound.  However, there is always a chance that some may remain in the wound. This can  increase your risk of infection.   If you see signs of infection (warmth, redness, tenderness, pus, sharp increase in pain, fever, red streaking in the skin) immediately return to the emergency department.   After the wound heals fully, apply sunscreen for 6-12 months to minimize scarring.   Read below information and follow recommendations. Facial Laceration  A facial laceration is a cut on the face. These injuries can be painful and cause bleeding. Lacerations usually heal quickly, but they need special care to  reduce scarring. DIAGNOSIS  Your health care provider will take a medical history, ask for details about how the injury occurred, and examine the wound to determine how deep the cut is. TREATMENT  Some facial lacerations may not require closure. Others may not be able to be closed because of an increased risk of infection. The risk of infection and the chance for successful closure will depend on various factors, including the amount of time since the injury occurred. The wound may be cleaned to help prevent infection. If closure is appropriate, pain medicines may be given if needed. Your health care provider will use stitches (sutures), wound glue (adhesive), or skin adhesive strips to repair the laceration. These tools bring the skin edges together to allow for faster healing and a better cosmetic outcome. If needed, you may also be given a tetanus shot. HOME CARE INSTRUCTIONS  Only take over-the-counter or prescription medicines as directed by your health care provider.  Follow your health care provider's instructions for wound care. These instructions will vary depending on the technique used for closing the wound. For Sutures:  Keep the wound clean and dry.   If you were given a bandage (dressing), you should change it at least once a day. Also change the dressing if it becomes wet or dirty, or as directed by your health care provider.   Wash the wound with soap and water 2 times a day. Rinse the wound off with water to remove all soap. Pat the wound dry with a clean towel.  After cleaning, apply a thin layer of the antibiotic ointment recommended by your health care provider. This will help prevent infection and keep the dressing from sticking.   You may shower as usual after the first 24 hours. Do not soak the wound in water until the sutures are removed.   Get your sutures removed as directed by your health care provider. With facial lacerations, sutures should usually be taken  out after 4-5 days to avoid stitch marks.   Wait a few days after your sutures are removed before applying any makeup. For Skin Adhesive Strips:  Keep the wound clean and dry.   Do not get the skin adhesive strips wet. You may bathe carefully, using caution to keep the wound dry.   If the wound gets wet, pat it dry with a clean towel.   Skin adhesive strips will fall off on their own. You may trim the strips as the wound heals. Do not remove skin adhesive strips that are still stuck to the wound. They will fall off in time.  For Wound Adhesive:  You may briefly wet your wound in the shower or bath. Do not soak or scrub the wound. Do not swim. Avoid periods of heavy sweating until the skin adhesive has fallen off on its own. After showering or bathing, gently pat the wound dry with a clean towel.   Do not apply liquid medicine, cream medicine, ointment medicine, or makeup to your wound while the skin adhesive is in place. This may loosen the film before your wound is healed.   If a dressing is placed over the wound, be careful not to apply tape directly over the skin adhesive. This may cause the adhesive to be pulled off before the wound is healed.   Avoid prolonged exposure to sunlight or tanning lamps while the skin adhesive is in place.  The skin adhesive will usually remain in place for 5-10 days, then naturally fall off the skin. Do not pick at the adhesive film.  After Healing: Once the wound has healed, cover the wound with sunscreen during the day for 1 full year. This can help minimize scarring. Exposure to ultraviolet light in the first year will darken the scar. It can take 1-2 years for the scar to lose its redness and to heal completely.  SEEK IMMEDIATE MEDICAL CARE IF:  You have redness, pain, or swelling around the wound.   You see ayellowish-white fluid (pus) coming from the wound.   You have chills or a fever.  MAKE SURE YOU:  Understand these  instructions.  Will watch your condition.  Will get help right away if you are not doing well or get worse. Document Released: 01/10/2005 Document Revised: 09/23/2013 Document Reviewed: 07/16/2013 [facility] Patient Information 2015 [place], [place]. This information is not intended to replace advice given to you by your health care provider. Make sure you discuss any questions you have with your health care provider.  Salt Water Gargle This solution will help make your mouth and throat feel better. HOME CARE INSTRUCTIONS   Mix 1 teaspoon of salt in 8 ounces of warm water.  Gargle with this solution as much or often as you need or as directed. Swish and gargle gently if you have any sores or wounds in your mouth.  Do not swallow this mixture. Document Released: 09/06/2004 Document Revised: 02/25/2012 Document Reviewed: 01/28/2009 [facility] Patient Information 2015 [place], [place]. This information is not intended to replace advice given to you by your  health care provider. Make sure you discuss any questions you have with your health care provider.

## 2015-04-25 DIAGNOSIS — S82009A Unspecified fracture of unspecified patella, initial encounter for closed fracture: Secondary | ICD-10-CM | POA: Diagnosis not present

## 2015-04-28 DIAGNOSIS — S82032A Displaced transverse fracture of left patella, initial encounter for closed fracture: Secondary | ICD-10-CM | POA: Diagnosis not present

## 2015-04-28 DIAGNOSIS — S82002A Unspecified fracture of left patella, initial encounter for closed fracture: Secondary | ICD-10-CM | POA: Diagnosis not present

## 2015-04-28 DIAGNOSIS — Y929 Unspecified place or not applicable: Secondary | ICD-10-CM | POA: Diagnosis not present

## 2015-04-28 DIAGNOSIS — G8918 Other acute postprocedural pain: Secondary | ICD-10-CM | POA: Diagnosis not present

## 2015-04-28 DIAGNOSIS — W19XXXA Unspecified fall, initial encounter: Secondary | ICD-10-CM | POA: Diagnosis not present

## 2015-05-07 DIAGNOSIS — K5900 Constipation, unspecified: Secondary | ICD-10-CM | POA: Diagnosis not present

## 2015-05-07 DIAGNOSIS — K529 Noninfective gastroenteritis and colitis, unspecified: Secondary | ICD-10-CM | POA: Diagnosis not present

## 2015-05-07 DIAGNOSIS — W19XXXD Unspecified fall, subsequent encounter: Secondary | ICD-10-CM | POA: Diagnosis not present

## 2015-05-07 DIAGNOSIS — S82002D Unspecified fracture of left patella, subsequent encounter for closed fracture with routine healing: Secondary | ICD-10-CM | POA: Diagnosis not present

## 2015-05-11 DIAGNOSIS — W19XXXD Unspecified fall, subsequent encounter: Secondary | ICD-10-CM | POA: Diagnosis not present

## 2015-05-11 DIAGNOSIS — K5900 Constipation, unspecified: Secondary | ICD-10-CM | POA: Diagnosis not present

## 2015-05-11 DIAGNOSIS — S82002D Unspecified fracture of left patella, subsequent encounter for closed fracture with routine healing: Secondary | ICD-10-CM | POA: Diagnosis not present

## 2015-05-11 DIAGNOSIS — K529 Noninfective gastroenteritis and colitis, unspecified: Secondary | ICD-10-CM | POA: Diagnosis not present

## 2015-05-13 DIAGNOSIS — K529 Noninfective gastroenteritis and colitis, unspecified: Secondary | ICD-10-CM | POA: Diagnosis not present

## 2015-05-13 DIAGNOSIS — K5900 Constipation, unspecified: Secondary | ICD-10-CM | POA: Diagnosis not present

## 2015-05-13 DIAGNOSIS — S82002D Unspecified fracture of left patella, subsequent encounter for closed fracture with routine healing: Secondary | ICD-10-CM | POA: Diagnosis not present

## 2015-05-13 DIAGNOSIS — W19XXXD Unspecified fall, subsequent encounter: Secondary | ICD-10-CM | POA: Diagnosis not present

## 2015-05-16 DIAGNOSIS — S82002D Unspecified fracture of left patella, subsequent encounter for closed fracture with routine healing: Secondary | ICD-10-CM | POA: Diagnosis not present

## 2015-05-16 DIAGNOSIS — W19XXXD Unspecified fall, subsequent encounter: Secondary | ICD-10-CM | POA: Diagnosis not present

## 2015-05-16 DIAGNOSIS — K529 Noninfective gastroenteritis and colitis, unspecified: Secondary | ICD-10-CM | POA: Diagnosis not present

## 2015-05-16 DIAGNOSIS — K5900 Constipation, unspecified: Secondary | ICD-10-CM | POA: Diagnosis not present

## 2015-05-18 DIAGNOSIS — S82032D Displaced transverse fracture of left patella, subsequent encounter for closed fracture with routine healing: Secondary | ICD-10-CM | POA: Diagnosis not present

## 2015-05-20 DIAGNOSIS — S82002D Unspecified fracture of left patella, subsequent encounter for closed fracture with routine healing: Secondary | ICD-10-CM | POA: Diagnosis not present

## 2015-05-20 DIAGNOSIS — K5900 Constipation, unspecified: Secondary | ICD-10-CM | POA: Diagnosis not present

## 2015-05-20 DIAGNOSIS — W19XXXD Unspecified fall, subsequent encounter: Secondary | ICD-10-CM | POA: Diagnosis not present

## 2015-05-20 DIAGNOSIS — K529 Noninfective gastroenteritis and colitis, unspecified: Secondary | ICD-10-CM | POA: Diagnosis not present

## 2015-05-23 DIAGNOSIS — K529 Noninfective gastroenteritis and colitis, unspecified: Secondary | ICD-10-CM | POA: Diagnosis not present

## 2015-05-23 DIAGNOSIS — S82002D Unspecified fracture of left patella, subsequent encounter for closed fracture with routine healing: Secondary | ICD-10-CM | POA: Diagnosis not present

## 2015-05-23 DIAGNOSIS — W19XXXD Unspecified fall, subsequent encounter: Secondary | ICD-10-CM | POA: Diagnosis not present

## 2015-05-23 DIAGNOSIS — K5900 Constipation, unspecified: Secondary | ICD-10-CM | POA: Diagnosis not present

## 2015-05-31 DIAGNOSIS — W19XXXD Unspecified fall, subsequent encounter: Secondary | ICD-10-CM | POA: Diagnosis not present

## 2015-05-31 DIAGNOSIS — K529 Noninfective gastroenteritis and colitis, unspecified: Secondary | ICD-10-CM | POA: Diagnosis not present

## 2015-05-31 DIAGNOSIS — S82002D Unspecified fracture of left patella, subsequent encounter for closed fracture with routine healing: Secondary | ICD-10-CM | POA: Diagnosis not present

## 2015-05-31 DIAGNOSIS — K5900 Constipation, unspecified: Secondary | ICD-10-CM | POA: Diagnosis not present

## 2015-06-01 DIAGNOSIS — S82032S Displaced transverse fracture of left patella, sequela: Secondary | ICD-10-CM | POA: Diagnosis not present

## 2015-06-08 ENCOUNTER — Ambulatory Visit: Payer: Commercial Managed Care - HMO | Attending: Orthopedic Surgery | Admitting: Physical Therapy

## 2015-06-08 ENCOUNTER — Encounter: Payer: Self-pay | Admitting: Physical Therapy

## 2015-06-08 DIAGNOSIS — M25662 Stiffness of left knee, not elsewhere classified: Secondary | ICD-10-CM | POA: Insufficient documentation

## 2015-06-08 DIAGNOSIS — R262 Difficulty in walking, not elsewhere classified: Secondary | ICD-10-CM

## 2015-06-08 DIAGNOSIS — M25562 Pain in left knee: Secondary | ICD-10-CM | POA: Diagnosis not present

## 2015-06-08 NOTE — Therapy (Signed)
[facility] [address]  [place], [place], [address] Phone: [phone]   Fax:  [phone]  Physical Therapy Evaluation  Patient Details  Name: Kylie Christensen MRN: [id number] Date of Birth: 03/03/1941 Referring Provider:  Cherylann Ratel, PA-C  Encounter Date: 06/08/2015      PT End of Session - 06/08/15 1423    Visit Number 1   Number of Visits 12   Date for PT Re-Evaluation 07/20/15   PT Start Time 9242   PT Stop Time 1455   PT Time Calculation (min) 52 min      Past Medical History  Diagnosis Date  . Hypothyroidism   . Anxiety   . GERD (gastroesophageal reflux disease)   . Headache   . Colitis     Past Surgical History  Procedure Laterality Date  . Back surgery  nov 2011    lower back  . Thyroid nodule removed  1970  . Abdominal hysterectomy      partial  . Tonsillectomy  age 74  . Biopsy breast Left yrs ago    benign  . Rotator cuffr Right   . Colonoscopy with propofol N/A 01/25/2015    Procedure: COLONOSCOPY WITH PROPOFOL;  Surgeon: Garlan Fair, MD;  Location: [facility];  Service: Endoscopy;  Laterality: N/A;  . Esophagogastroduodenoscopy (egd) with propofol N/A 01/25/2015    Procedure: ESOPHAGOGASTRODUODENOSCOPY (EGD) WITH PROPOFOL;  Surgeon: Garlan Fair, MD;  [facility]: [facility];  Service: Endoscopy;  Laterality: N/A;  . Balloon dilation N/A 01/25/2015    Procedure: BALLOON DILATION;  Surgeon: Garlan Fair, MD;  [facility];  Service: Endoscopy;  Laterality: N/A;    There were no vitals filed for this visit.  Visit Diagnosis:  Left knee pain  Knee stiffness, left  Difficulty walking      Subjective Assessment - 06/08/15 1412    Subjective pt fell [place] [employer] on 04/23/15 resulting in L patellar fracture which required ORIF on 04/28/15.  Following surgery pt was in immobilizer for one week then progressed to hinged knee brace locked at 30dg.  She  has progressed to 40dg with knee brace.  She has been receiving HHPT 2x since surgery.  She applies ice to knee daily.  She progressed to single axillary crutch use within 4 weeks after surgery and continues to ambulate with single axillary crutch at all times.   How long can you stand comfortably? 10-15 minutes   Patient Stated Goals return to ballroom dancing   Currently in Pain? Yes   Pain Score --  2/10 on AVG, worst pain up 4-5/10.   Pain Location Knee   Pain Orientation Left   Pain Descriptors / Indicators Burning;Tightness   Pain Radiating Towards also experiences cramping to L LE daily   Pain Frequency Intermittent   Aggravating Factors  prolonged standing or walking   Pain Relieving Factors movement            [facility] PT Assessment - 06/08/15 0001    Assessment   Medical Diagnosis s/p L patella ORIF   Onset Date/Surgical Date 04/28/15   Restrictions   Other Position/Activity Restrictions L Knee out of brace ROM 0-50 x 1 week and 0-60 x 1 week   Balance Screen   Has the patient fallen in the past 6 months Yes   How many times? 1   Has the patient had a decrease in activity level because of a fear of falling?  No   Is the patient reluctant to leave their home because of a fear of falling?  No   Home Ec[place] residence   Living Arrangements Spouse/significant other   Home Access Stairs to enter   Entrance Stairs-Number of Steps [place] One level   Prior Function   Vocation Retired   Leisure denies regular exercise over the past year other than ballroom dancing   Observation/Other Assessments   Focus on Therapeutic Outcomes (FOTO)  64% limitation   ROM / Strength   AROM / PROM / Strength PROM;AROM   AROM   Overall AROM Comments L knee flexion to 55   PROM   Overall PROM Comments L Knee 0-55           TODAY'S TREATMENT TherEx - reviewed HEP with pt, practiced SLR, pt advised to perform HEP 3x/day                 PT Education - 06/08/15 1501    Education provided Yes   Education Details advised pt to increase number of reps/sets of HHPT HEP   Person(s) Educated Patient   Methods Explanation   Comprehension Verbalized understanding          PT Short Term Goals - 06/08/15 1504    PT SHORT TERM GOAL #1   Title pt progresses to 0-90 L knee ROM by 06/30/15   Status New           PT Long Term Goals - 06/08/15 1505    PT LONG TERM GOAL #1   Title L Knee AROM 0-130 or better by 07/20/15   Status New   PT LONG TERM GOAL #2   Title pt able to ambulate without need for AD or knee brace with good mechanics and distance not limited by knee pain by 07/20/15   Status New   PT LONG TERM GOAL #3   Title pt able to return to ballroom dancing by 07/20/15   Status New               Plan - 06/08/15 1507    Clinical Impression Statement Pt is 6 weeks s/p L patellar ORIF due to fall resulting in fracture on 04/23/15.  Pt participated in [place] following surgery and has progressed to ambulating with use of single axillary crutch with hinged knee brace locked 0-40 degrees.  Pt's gait mechanics are as good as can be expected with ROM limited to ~40 degrees flexion.  Referral from MD states L Knee out of brace ROM 0-50 degrees x 1 week and 0-60 degrees x 1 week.  ROM assessment today with pt wearing knee brace locked at 40 degrees flexion reveals 55 degrees of flexion allowed at the knee due to amount of allowable soft tissue compression in thigh straps.  Pt denies noting pain or strain with this amount of ROM.  R Knee Extension MMT 3+/5 with c/o pain and Flexion MMT 4/5 also with mild pain.  Surgical scar is hypomobile to distal 1/2 but has healed very well otherwise.   Pt will benefit from skilled therapeutic intervention in order to improve on the following deficits Pain;Decreased strength;Decreased mobility;Difficulty walking;Abnormal gait;Decreased range of motion;Decreased scar mobility;Decreased balance    Rehab Potential Good   PT Frequency 2x / week   PT Duration 6 weeks   PT Treatment/Interventions Therapeutic exercise;Manual techniques;Gait training;Therapeutic activities;Patient/family education;Vasopneumatic Device;Scar mobilization;Balance training;Electrical Stimulation;Functional mobility training   PT Next Visit Plan  NuStep; re-assess ROM next week to see if 0-60; once pt alllowed to increase brace setting greater than 40dg flexion we will begin CKC training (step-up and leg press) to tolerance   Consulted and Agree with Plan of Care Patient          G-Codes - Jun 23, 2015 1423    Functional Assessment Tool Used foto 64% limitation   Functional Limitation Mobility: Walking and moving around   Mobility: Walking and Moving Around Current Status [phone]) At least 60 percent but less than 80 percent impaired, limited or restricted   Mobility: Walking and Moving Around Goal Status [phone]) At least 40 percent but less than 60 percent impaired, limited or restricted       Problem List There are no active problems to display for this patient.   [facility] PT, OCS June 23, 2015, 3:34 PM  [facility] [address]  [place], [place], [address] Phone: [phone]   Fax:  [phone]

## 2015-06-13 ENCOUNTER — Other Ambulatory Visit: Payer: Self-pay

## 2015-06-15 ENCOUNTER — Ambulatory Visit: Payer: Commercial Managed Care - HMO | Admitting: Rehabilitation

## 2015-06-21 DIAGNOSIS — H00016 Hordeolum externum left eye, unspecified eyelid: Secondary | ICD-10-CM | POA: Diagnosis not present

## 2015-06-22 ENCOUNTER — Ambulatory Visit: Payer: Commercial Managed Care - HMO | Attending: Orthopedic Surgery | Admitting: Rehabilitation

## 2015-06-22 DIAGNOSIS — R262 Difficulty in walking, not elsewhere classified: Secondary | ICD-10-CM | POA: Insufficient documentation

## 2015-06-22 DIAGNOSIS — M25562 Pain in left knee: Secondary | ICD-10-CM | POA: Diagnosis not present

## 2015-06-22 DIAGNOSIS — M25662 Stiffness of left knee, not elsewhere classified: Secondary | ICD-10-CM | POA: Diagnosis not present

## 2015-06-22 NOTE — Therapy (Signed)
[facility] [address]  [place], [place], [address] Phone: [phone]   Fax:  [phone]  Physical Therapy Treatment  Patient Details  Name: Kylie Christensen MRN: [id number] Date of Birth: 03/24/1941 Referring Provider:  Cherylann Ratel, PA-C  Encounter Date: 06/22/2015      PT End of Session - 06/22/15 1317    Visit Number 2   Number of Visits 12   Date for PT Re-Evaluation 07/20/15   PT Start Time 8182   PT Stop Time 1400   PT Time Calculation (min) 43 min      Past Medical History  Diagnosis Date  . Hypothyroidism   . Anxiety   . GERD (gastroesophageal reflux disease)   . Headache   . Colitis     Past Surgical History  Procedure Laterality Date  . Back surgery  nov 2011    lower back  . Thyroid nodule removed  1970  . Abdominal hysterectomy      partial  . Tonsillectomy  age 78  . Biopsy breast Left yrs ago    benign  . Rotator cuffr Right   . Colonoscopy with propofol N/A 01/25/2015    Procedure: COLONOSCOPY WITH PROPOFOL;  Surgeon: Garlan Fair, MD;  [facility];  Service: Endoscopy;  Laterality: N/A;  . Esophagogastroduodenoscopy (egd) with propofol N/A 01/25/2015    Procedure: ESOPHAGOGASTRODUODENOSCOPY (EGD) WITH PROPOFOL;  Surgeon: Garlan Fair, MD;  [facility];  Service: Endoscopy;  Laterality: N/A;  . Balloon dilation N/A 01/25/2015    Procedure: BALLOON DILATION;  Surgeon: Garlan Fair, MD;  [facility];  Service: Endoscopy;  Laterality: N/A;    There were no vitals filed for this visit.  Visit Diagnosis:  Left knee pain  Knee stiffness, left  Difficulty walking      Subjective Assessment - 06/22/15 1321    Subjective Saw MD last Friday and he took her out of the brace for good. Also told her to walk around the house without the crutch but to take it with her out in the community. Main complaint is muscle spasms which have been  occuring more ofter through her whole leg. No pain today just stiffness. Notes pain sometime along lateral knee/ITB area.    Currently in Pain? No/denies     TODAY'S TREATMENT: TherEx-Nustep level 3x5' Peanut Ball Tuck 3"x10 Hip Ext Iso into Peanut Ball 3"x10 SAQ 0# x10 Bridges x10 (knees bent to tolerance) 6" Fwd Step ups x10 with 2 pole assist Slow March on Blue Foam x10 with 2 pole assist Alt Hip Abduction on Blue Foam x10 with 2 pole assist Lt ITB stretch in Obers position 2x20"  Manual-STM and Strumming to Lt ITB in Rt side-lying. Medial/inferior patellar glides in same position.    AROM: 6-85       PT Short Term Goals - 06/22/15 1324    PT SHORT TERM GOAL #1   Title pt progresses to 0-90 L knee ROM by 06/30/15   Status On-going           PT Long Term Goals - 06/22/15 1324    PT LONG TERM GOAL #1   Title L Knee AROM 0-130 or better by 07/20/15   Status On-going   PT LONG TERM GOAL #2   Title pt able to ambulate without need for AD or knee brace with good mechanics and distance not limited by knee pain by 07/20/15   Status  On-going   PT LONG TERM GOAL #3   Title pt able to return to ballroom dancing by 07/20/15   Status On-going               Plan - 06/22/15 1400    Clinical Impression Statement Good tolerance to all exercises with some increase in pain after 8th rep on step ups. Very tight and tender ITB and good response to manual work.    PT Next Visit Plan Continue ROM, CKC exericses, manual to ITB as needed.         Problem List There are no active problems to display for this patient.   Barbette Hair, PTA 06/22/2015, 2:02 PM  [facility] [address]  [place], [place], [address] Phone: [phone]   Fax:  [phone]

## 2015-06-24 ENCOUNTER — Ambulatory Visit: Payer: Commercial Managed Care - HMO | Admitting: Physical Therapy

## 2015-06-24 DIAGNOSIS — R262 Difficulty in walking, not elsewhere classified: Secondary | ICD-10-CM | POA: Diagnosis not present

## 2015-06-24 DIAGNOSIS — M25662 Stiffness of left knee, not elsewhere classified: Secondary | ICD-10-CM

## 2015-06-24 DIAGNOSIS — M25562 Pain in left knee: Secondary | ICD-10-CM | POA: Diagnosis not present

## 2015-06-24 NOTE — Therapy (Signed)
[facility] [address]  [place], [place], [address] Phone: [phone]   Fax:  [phone]  Physical Therapy Treatment  Patient Details  Name: Kylie Christensen MRN: [id number] Date of Birth: 1941-03-30 Referring Provider:  Cherylann Ratel, PA-C  Encounter Date: 06/24/2015      PT End of Session - 06/24/15 1116    Visit Number 3   Number of Visits 12   Date for PT Re-Evaluation 07/20/15   PT Start Time 1108   PT Stop Time 2094   PT Time Calculation (min) 48 min      Past Medical History  Diagnosis Date  . Hypothyroidism   . Anxiety   . GERD (gastroesophageal reflux disease)   . Headache   . Colitis     Past Surgical History  Procedure Laterality Date  . Back surgery  nov 2011    lower back  . Thyroid nodule removed  1970  . Abdominal hysterectomy      partial  . Tonsillectomy  age 31  . Biopsy breast Left yrs ago    benign  . Rotator cuffr Right   . Colonoscopy with propofol N/A 01/25/2015    Procedure: COLONOSCOPY WITH PROPOFOL;  Surgeon: Garlan Fair, MD;  [facility];  Service: Endoscopy;  Laterality: N/A;  . Esophagogastroduodenoscopy (egd) with propofol N/A 01/25/2015    Procedure: ESOPHAGOGASTRODUODENOSCOPY (EGD) WITH PROPOFOL;  Surgeon: Garlan Fair, MD;  [facility];  Service: Endoscopy;  Laterality: N/A;  . Balloon dilation N/A 01/25/2015    Procedure: BALLOON DILATION;  Surgeon: Garlan Fair, MD;  [facility]: [facility];  Service: Endoscopy;  Laterality: N/A;    There were no vitals filed for this visit.  Visit Diagnosis:  Left knee pain  Knee stiffness, left  Difficulty walking      Subjective Assessment - 06/24/15 1113    Subjective states knee is sore this AM for unknown reason.  States felt pretty good yesterday but pain began in middle of night last night.  Iced knee and took tylenol.  States is only using crutch with stairs and while away from  home.   Currently in Pain? Yes   Pain Score 7    Pain Location Knee   Pain Orientation Left;Anterior            [facility] PT Assessment - 06/24/15 0001    PROM   Overall PROM Comments L Knee 0-104         TODAY'S TREATMENT: TherEx-Nustep level 2x3'  Manual - Seated L Knee AP grade 1-2, L patellar inferior and medial glides grade 3 . Supine Grade 2 patellar rotation mobes (reduces pain), Grade 3 knee Flexion mobes.  TherEx -  SAQ 0# 10x, 2# 10x B FOB (55cm) tuck 15x Bridges x10 (pt attempting to increase knee flexion each rep) 4" Fwd Step ups x10 with single pole assist (attempted 6" but painful) 8" toe tapping while standing on foam pad 10x each without pole assist B Knee Flexion Machine 15# 15x Foam Beam side stepping 3 laps each Foam Beam FW walk 2 laps TRX Squat 10x                    PT Short Term Goals - 06/24/15 1200    PT SHORT TERM GOAL #1   Title pt progresses to 0-90 L knee ROM by 06/30/15   Status Achieved  PT Long Term Goals - 06/24/15 1201    PT LONG TERM GOAL #1   Title L Knee AROM 0-130 or better by 07/20/15   Status On-going   PT LONG TERM GOAL #2   Title pt able to ambulate without need for AD or knee brace with good mechanics and distance not limited by knee pain by 07/20/15   Status On-going   PT LONG TERM GOAL #3   Title pt able to return to ballroom dancing by 07/20/15   Status On-going               Plan - 06/24/15 1159    Clinical Impression Statement is progressing well with current POC, good pain reduction with patellar mobes   PT Next Visit Plan Continue ROM, CKC exericses, patellar mobes, and manual to ITB as needed.    Consulted and Agree with Plan of Care Patient        Problem List There are no active problems to display for this patient.   Joleah Kosak PT, OCS 06/24/2015, 12:01 PM  [facility] [address]  [place], [place],  [address] Phone: [phone]   Fax:  [phone]

## 2015-06-27 ENCOUNTER — Ambulatory Visit: Payer: Commercial Managed Care - HMO | Admitting: Rehabilitation

## 2015-06-27 DIAGNOSIS — R262 Difficulty in walking, not elsewhere classified: Secondary | ICD-10-CM

## 2015-06-27 DIAGNOSIS — M25562 Pain in left knee: Secondary | ICD-10-CM

## 2015-06-27 DIAGNOSIS — M25662 Stiffness of left knee, not elsewhere classified: Secondary | ICD-10-CM | POA: Diagnosis not present

## 2015-06-27 NOTE — Therapy (Signed)
[facility] [address]  [place], [place], [address] Phone: [phone]   Fax:  [phone]  Physical Therapy Treatment  Patient Details  Name: Kylie Christensen MRN: [id number] Date of Birth: 1941/05/28 Referring Provider:  Cherylann Ratel, PA-C  Encounter Date: 06/27/2015      PT End of Session - 06/27/15 1410    Visit Number 4   Number of Visits 12   Date for PT Re-Evaluation 07/20/15   PT Start Time 8270   PT Stop Time 1443   PT Time Calculation (min) 40 min      Past Medical History  Diagnosis Date  . Hypothyroidism   . Anxiety   . GERD (gastroesophageal reflux disease)   . Headache   . Colitis     Past Surgical History  Procedure Laterality Date  . Back surgery  nov 2011    lower back  . Thyroid nodule removed  1970  . Abdominal hysterectomy      partial  . Tonsillectomy  age 62  . Biopsy breast Left yrs ago    benign  . Rotator cuffr Right   . Colonoscopy with propofol N/A 01/25/2015    Procedure: COLONOSCOPY WITH PROPOFOL;  Surgeon: Garlan Fair, MD;  [facility];  Service: Endoscopy;  Laterality: N/A;  . Esophagogastroduodenoscopy (egd) with propofol N/A 01/25/2015    Procedure: ESOPHAGOGASTRODUODENOSCOPY (EGD) WITH PROPOFOL;  Surgeon: Garlan Fair, MD;  [facility];  Service: Endoscopy;  Laterality: N/A;  . Balloon dilation N/A 01/25/2015    Procedure: BALLOON DILATION;  Surgeon: Garlan Fair, MD;  [facility];  Service: Endoscopy;  Laterality: N/A;    There were no vitals filed for this visit.  Visit Diagnosis:  Left knee pain  Knee stiffness, left  Difficulty walking      Subjective Assessment - 06/27/15 1408    Subjective Noted increase pain yesterday due to sitting with her knee bent for a long time. This went away after walking around a little.     Currently in Pain? Yes   Pain Score 5    Pain Location Knee   Pain Orientation  Left;Anterior   Pain Descriptors / Indicators Tightness            [facility] PT Assessment - 06/27/15 0001    AROM   Overall AROM Comments Lt Knee Flexion 100       TODAY'S TREATMENT: TherEx-Rec Bike level 0 x5'  Manual - Supine Grade 2 patellar rotation mobes (reduces pain), medial and inferior patellar glides Grade 1-2.  TherEx -  SAQ 0# 10x, 2# 15x SLR 2# 10x B FOB (55cm) tuck 15x Bridges 12x 4" Fwd Step ups x10 with single pole assist (attempted 6" but painful) TRX Squat 12x Marching on Blue Foam 10x Alt Hip Abduction on Blue Foam 10x        PT Short Term Goals - 06/24/15 1200    PT SHORT TERM GOAL #1   Title pt progresses to 0-90 L knee ROM by 06/30/15   Status Achieved           PT Long Term Goals - 06/24/15 1201    PT LONG TERM GOAL #1   Title L Knee AROM 0-130 or better by 07/20/15   Status On-going   PT LONG TERM GOAL #2   Title pt able to ambulate without need for AD or knee brace with good mechanics and distance not limited by  knee pain by 07/20/15   Status On-going   PT LONG TERM GOAL #3   Title pt able to return to ballroom dancing by 07/20/15   Status On-going               Plan - 06/27/15 1445    Clinical Impression Statement Good tolerance with all exercises, performed patellar mobs before treatment to help reduce pain.    PT Next Visit Plan Continue ROM, CKC exericses, patellar mobes, and manual to ITB as needed.    Consulted and Agree with Plan of Care Patient        Problem List There are no active problems to display for this patient.   [place], [place] 06/27/2015, 2:45 PM  [facility] [address]  [place], [place], [address] Phone: [phone]   Fax:  [phone]

## 2015-06-30 ENCOUNTER — Ambulatory Visit: Payer: Commercial Managed Care - HMO | Admitting: Rehabilitation

## 2015-06-30 DIAGNOSIS — R262 Difficulty in walking, not elsewhere classified: Secondary | ICD-10-CM | POA: Diagnosis not present

## 2015-06-30 DIAGNOSIS — M25562 Pain in left knee: Secondary | ICD-10-CM

## 2015-06-30 DIAGNOSIS — M25662 Stiffness of left knee, not elsewhere classified: Secondary | ICD-10-CM | POA: Diagnosis not present

## 2015-06-30 NOTE — Therapy (Signed)
[facility] [address]  [place], [place], [address] Phone: [phone]   Fax:  [phone]  Physical Therapy Treatment  Patient Details  Name: Kylie Christensen MRN: [id number] Date of Birth: 1941-07-13 Referring Provider:  Cherylann Ratel, PA-C  Encounter Date: 06/30/2015      PT End of Session - 06/30/15 1402    Visit Number 5   Number of Visits 12   Date for PT Re-Evaluation 07/20/15   PT Start Time 1402   PT Stop Time 1451   PT Time Calculation (min) 49 min      Past Medical History  Diagnosis Date  . Hypothyroidism   . Anxiety   . GERD (gastroesophageal reflux disease)   . Headache   . Colitis     Past Surgical History  Procedure Laterality Date  . Back surgery  nov 2011    lower back  . Thyroid nodule removed  1970  . Abdominal hysterectomy      partial  . Tonsillectomy  age 18  . Biopsy breast Left yrs ago    benign  . Rotator cuffr Right   . Colonoscopy with propofol N/A 01/25/2015    Procedure: COLONOSCOPY WITH PROPOFOL;  Surgeon: Garlan Fair, MD;  [facility];  Service: Endoscopy;  Laterality: N/A;  . Esophagogastroduodenoscopy (egd) with propofol N/A 01/25/2015    Procedure: ESOPHAGOGASTRODUODENOSCOPY (EGD) WITH PROPOFOL;  Surgeon: Garlan Fair, MD;  [facility];  Service: Endoscopy;  Laterality: N/A;  . Balloon dilation N/A 01/25/2015    Procedure: BALLOON DILATION;  Surgeon: Garlan Fair, MD;  [facility]: [facility];  Service: Endoscopy;  Laterality: N/A;    There were no vitals filed for this visit.  Visit Diagnosis:  Left knee pain  Knee stiffness, left  Difficulty walking      Subjective Assessment - 06/30/15 1406    Subjective Reports no pain but increased stiffness. States she has noticed that if she sits too long then her knee gets really stiff.    Currently in Pain? No/denies      TODAY'S TREATMENT: TherEx- Nustep level 5x5' (UE/LE,  seat at 8) 4" Step up x10 with 2 HHA 4" Lateral Step up x10 with 2 HHA 6" Step up x10 with 2 HHA TRX Squats 15x Foam Beam side stepping 3 laps each Foam Beam FW tandem walk 4 laps each B FOB (55cm) tuck 15x Bridges 12x SAQ 3# x10, SLR 2# x15  Manual - Supine Grade 2 patellar rotation mobes (reduces pain), medial and inferior patellar glides Grade 1-2. PROM into knee Flexion      PT Short Term Goals - 06/24/15 1200    PT SHORT TERM GOAL #1   Title pt progresses to 0-90 L knee ROM by 06/30/15   Status Achieved           PT Long Term Goals - 06/24/15 1201    PT LONG TERM GOAL #1   Title L Knee AROM 0-130 or better by 07/20/15   Status On-going   PT LONG TERM GOAL #2   Title pt able to ambulate without need for AD or knee brace with good mechanics and distance not limited by knee pain by 07/20/15   Status On-going   PT LONG TERM GOAL #3   Title pt able to return to ballroom dancing by 07/20/15   Status On-going  Plan - 06/30/15 1451    Clinical Impression Statement Pt able to complete 6" step ups today with minimal pain and is progressing well with all exercises. No LOB with balance exercises or complaint of pain with exercises.    PT Next Visit Plan Continue ROM, CKC exericses, patellar mobes, and manual to ITB as needed.    Consulted and Agree with Plan of Care Patient        Problem List There are no active problems to display for this patient.   [place], [place] 06/30/2015, 2:54 PM  [facility] [address]  [place], [place], [address] Phone: [phone]   Fax:  [phone]

## 2015-07-04 ENCOUNTER — Ambulatory Visit: Payer: Commercial Managed Care - HMO | Admitting: Physical Therapy

## 2015-07-04 DIAGNOSIS — R262 Difficulty in walking, not elsewhere classified: Secondary | ICD-10-CM | POA: Diagnosis not present

## 2015-07-04 DIAGNOSIS — M25562 Pain in left knee: Secondary | ICD-10-CM | POA: Diagnosis not present

## 2015-07-04 DIAGNOSIS — M25662 Stiffness of left knee, not elsewhere classified: Secondary | ICD-10-CM

## 2015-07-04 NOTE — Therapy (Signed)
[facility] [address]  [place], [place], [address] Phone: [phone]   Fax:  [phone]  Physical Therapy Treatment  Patient Details  Name: Kylie Christensen MRN: [id number] Date of Birth: 06/12/1941 Referring Provider:  Cherylann Ratel, PA-C  Encounter Date: 07/04/2015      PT End of Session - 07/04/15 1406    Visit Number 6   Number of Visits 12   Date for PT Re-Evaluation 07/20/15   PT Start Time 1402   PT Stop Time 1443   PT Time Calculation (min) 41 min      Past Medical History  Diagnosis Date  . Hypothyroidism   . Anxiety   . GERD (gastroesophageal reflux disease)   . Headache   . Colitis     Past Surgical History  Procedure Laterality Date  . Back surgery  nov 2011    lower back  . Thyroid nodule removed  1970  . Abdominal hysterectomy      partial  . Tonsillectomy  age 21  . Biopsy breast Left yrs ago    benign  . Rotator cuffr Right   . Colonoscopy with propofol N/A 01/25/2015    Procedure: COLONOSCOPY WITH PROPOFOL;  Surgeon: Garlan Fair, MD;  Location: [facility];  Service: Endoscopy;  Laterality: N/A;  . Esophagogastroduodenoscopy (egd) with propofol N/A 01/25/2015    Procedure: ESOPHAGOGASTRODUODENOSCOPY (EGD) WITH PROPOFOL;  Surgeon: Garlan Fair, MD;  [facility]: [facility];  Service: Endoscopy;  Laterality: N/A;  . Balloon dilation N/A 01/25/2015    Procedure: BALLOON DILATION;  Surgeon: Garlan Fair, MD;  [facility]: [facility];  Service: Endoscopy;  Laterality: N/A;    There were no vitals filed for this visit.  Visit Diagnosis:  Left knee pain  Knee stiffness, left  Difficulty walking      Subjective Assessment - 07/04/15 1404    Subjective States has not performed HEP past couple days but has been busy with housework.  States notes stiffness/pain in L anterior thigh proximal to knee.  Was able to make it through church and sunday school  yesterday  without having to get up and walk around.   Currently in Pain? No/denies  "not really"   Pain Score --  "a little burning stinging when muscle gets tight"            [facility] PT Assessment - 07/04/15 0001    AROM   Overall AROM Comments L Knee Flexion to 115   PROM   Overall PROM Comments L Knee 0-122      TODAY'S TREATMENT NuStep LVL 5, 5\' 6"  FW Step-up 10x with Single Pole A 8" FW Step-up 10x with Single Pole A Seated LAQ 4# ankle wt 2x10 Standing Knee Flexion 4# 10x SLR 2# 15x Side-Lying Hip ABD 2# 15x Bridge 10x Single Leg Bridge 6x each 2" FW Stepdown and BW stepup with Single Pole A 4" FW Stepdown and BW stepup with Single Pole A (unable to reach L foot to ground due to knee pain but performed all 10 reps)  Manual - L Patellar rotation mobes grade 2 (good benefit, "feels good"), L patellar grade 3 inferior and medial glides ("good discomfort")                          PT Short Term Goals - 06/24/15 1200    PT SHORT TERM GOAL #1   Title pt progresses to  0-90 L knee ROM by 06/30/15   Status Achieved           PT Long Term Goals - 07/04/15 1448    PT LONG TERM GOAL #1   Title L Knee AROM 0-130 or better by 07/20/15   Status On-going   PT LONG TERM GOAL #2   Title pt able to ambulate without need for AD or knee brace with good mechanics and distance not limited by knee pain by 07/20/15  good mechanics but intermittent pain   Status On-going   PT LONG TERM GOAL #3   Title pt able to return to ballroom dancing by 07/20/15   Status On-going               Plan - 07/04/15 1445    Clinical Impression Statement excellent progress to date, consitent improvements in ROM as well as level of function.  Able to perform 8" step-ups with single pole assistance but has great difficulty with stepdowns due to pulling pain.  Continues to note good benefit with patellar mobes.  Increased HEP today to resistance training.   PT Next Visit Plan Continue  progressing stairs and ROM; patellar mobes for pain; modalities PRN   Consulted and Agree with Plan of Care Patient        Problem List There are no active problems to display for this patient.   Neomi Laidler PT, OCS 07/04/2015, 2:48 PM  [facility] [address]  [place], [place], [address] Phone: [phone]   Fax:  [phone]

## 2015-07-07 ENCOUNTER — Ambulatory Visit: Payer: Commercial Managed Care - HMO | Admitting: Rehabilitation

## 2015-07-07 DIAGNOSIS — M25662 Stiffness of left knee, not elsewhere classified: Secondary | ICD-10-CM | POA: Diagnosis not present

## 2015-07-07 DIAGNOSIS — M25562 Pain in left knee: Secondary | ICD-10-CM

## 2015-07-07 DIAGNOSIS — R262 Difficulty in walking, not elsewhere classified: Secondary | ICD-10-CM

## 2015-07-07 NOTE — Therapy (Signed)
[facility] [address]  [place], [place], [address] Phone: [phone]   Fax:  [phone]  Physical Therapy Treatment  Patient Details  Name: Kylie Christensen MRN: [id number] Date of Birth: 10/15/1941 Referring Provider:  Cherylann Ratel, PA-C  Encounter Date: 07/07/2015      PT End of Session - 07/07/15 1400    Visit Number 7   Number of Visits 12   Date for PT Re-Evaluation 07/20/15   PT Start Time 1400   PT Stop Time 1441   PT Time Calculation (min) 41 min      Past Medical History  Diagnosis Date  . Hypothyroidism   . Anxiety   . GERD (gastroesophageal reflux disease)   . Headache   . Colitis     Past Surgical History  Procedure Laterality Date  . Back surgery  nov 2011    lower back  . Thyroid nodule removed  1970  . Abdominal hysterectomy      partial  . Tonsillectomy  age 74  . Biopsy breast Left yrs ago    benign  . Rotator cuffr Right   . Colonoscopy with propofol N/A 01/25/2015    Procedure: COLONOSCOPY WITH PROPOFOL;  Surgeon: Garlan Fair, MD;  [facility];  Service: Endoscopy;  Laterality: N/A;  . Esophagogastroduodenoscopy (egd) with propofol N/A 01/25/2015    Procedure: ESOPHAGOGASTRODUODENOSCOPY (EGD) WITH PROPOFOL;  Surgeon: Garlan Fair, MD;  [facility];  Service: Endoscopy;  Laterality: N/A;  . Balloon dilation N/A 01/25/2015    Procedure: BALLOON DILATION;  Surgeon: Garlan Fair, MD;  [facility];  Service: Endoscopy;  Laterality: N/A;    There were no vitals filed for this visit.  Visit Diagnosis:  Left knee pain  Knee stiffness, left  Difficulty walking      Subjective Assessment - 07/07/15 1401    Subjective "I figured out that I can't get down on my knees this morning." States she tried to kneel down and about half way she noted day. Noted some pain after last time but that turned into soreness later that evening.    Currently in Pain? No/denies      TODAY'S TREATMENT NuStep LVL 5, 5' Side-Stepping on Blue Balance Beam 4x (intermittent assist for balance) Tandem Walking on Blue Balance Beam 4x (Fwd only) (intermittent assist for balance) 8" Fw Step-up 10x with Single Pole A Standing Hip Flexion Red TB at ankles 10x with 2 pole assist Standing Hip Abduction Red TB at ankles 10x with 2 pole assist Seated LAQ 4# ankle wt 2x12 Seated Hamstring Curls Blue TB 10x Standing March on Blue Foam 10x with 2 pole assist  Manual - L Patellar rotation mobes grade 2, L patellar grade 2-3 inferior and medial glides.       PT Short Term Goals - 06/24/15 1200    PT SHORT TERM GOAL #1   Title pt progresses to 0-90 L knee ROM by 06/30/15   Status Achieved           PT Long Term Goals - 07/04/15 1448    PT LONG TERM GOAL #1   Title L Knee AROM 0-130 or better by 07/20/15   Status On-going   PT LONG TERM GOAL #2   Title pt able to ambulate without need for AD or knee brace with good mechanics and distance not limited by knee pain by 07/20/15  good mechanics but intermittent pain  Status On-going   PT LONG TERM GOAL #3   Title pt able to return to ballroom dancing by 07/20/15   Status On-going               Plan - 07/07/15 1442    Clinical Impression Statement Noted slight pain with 8" step up but pt able to complete. Still reports pain relief with patellar mobs but improved tolerance to balance exercises with no LOB.    PT Next Visit Plan Continue progressing stairs and ROM; patellar mobes for pain; modalities PRN   Consulted and Agree with Plan of Care Patient        Problem List There are no active problems to display for this patient.   [place], [place] 07/07/2015, 2:43 PM  [facility] [address]  [place], [place], [address] Phone: [phone]   Fax:  [phone]

## 2015-07-11 ENCOUNTER — Ambulatory Visit: Payer: Commercial Managed Care - HMO | Admitting: Physical Therapy

## 2015-07-11 DIAGNOSIS — R262 Difficulty in walking, not elsewhere classified: Secondary | ICD-10-CM | POA: Diagnosis not present

## 2015-07-11 DIAGNOSIS — M25662 Stiffness of left knee, not elsewhere classified: Secondary | ICD-10-CM | POA: Diagnosis not present

## 2015-07-11 DIAGNOSIS — M25562 Pain in left knee: Secondary | ICD-10-CM

## 2015-07-11 NOTE — Therapy (Signed)
[facility] [address]  [place], [place], [address] Phone: [phone]   Fax:  [phone]  Physical Therapy Treatment  Patient Details  Name: Kylie Christensen MRN: [id number] Date of Birth: [birth year] Referring Provider:  Cherylann Ratel, PA-C  Encounter Date: 07/11/2015      PT End of Session - 07/11/15 1404    Visit Number 8   Number of Visits 12   Date for PT Re-Evaluation 07/20/15   PT Start Time 6962   PT Stop Time 1446   PT Time Calculation (min) 47 min      Past Medical History  Diagnosis Date  . Hypothyroidism   . Anxiety   . GERD (gastroesophageal reflux disease)   . Headache   . Colitis     Past Surgical History  Procedure Laterality Date  . Back surgery  nov 2011    lower back  . Thyroid nodule removed  1970  . Abdominal hysterectomy      partial  . Tonsillectomy  age 74  . Biopsy breast Left yrs ago    benign  . Rotator cuffr Right   . Colonoscopy with propofol N/A 01/25/2015    Procedure: COLONOSCOPY WITH PROPOFOL;  Surgeon: Garlan Fair, MD;  [facility];  Service: Endoscopy;  Laterality: N/A;  . Esophagogastroduodenoscopy (egd) with propofol N/A 01/25/2015    Procedure: ESOPHAGOGASTRODUODENOSCOPY (EGD) WITH PROPOFOL;  Surgeon: Garlan Fair, MD;  [facility];  Service: Endoscopy;  Laterality: N/A;  . Balloon dilation N/A 01/25/2015    Procedure: BALLOON DILATION;  Surgeon: Garlan Fair, MD;  [facility];  Service: Endoscopy;  Laterality: N/A;    There were no vitals filed for this visit.  Visit Diagnosis:  Left knee pain  Knee stiffness, left      Subjective Assessment - 07/11/15 1402    Subjective states noted ache pain while at church this past weekend and had to get up and walk some during sunday school. States believes this may have been due to her sleeping in late and not performing HEP/stretches prior to leaving for church.   Currently in Pain? No/denies       TODAY'S TREATMENT TherEx - Nustep lvl 5, 5' TRX DL Squat 10x Leg Press 25#, 20x TRX Lunges 8x each (pain) - had Blue disc on floor and pt attempting to tap knee to pad  (unable today)     [facility] PT Assessment - 07/11/15 0001    AROM   Overall AROM Comments L Knee 4-126   PROM   Overall PROM Comments L Knee 0-132    Knee Flexion Machine DL 20# 15x Knee Extension Machine DL 10# 15x Side Stepping Green TB at ankles 20' each Monster Walk Green TB at ankles 20' each  Manual - L Patellar rotation mobes grade 2, L patellar grade 3 inferior and medial glides, L knee flexion mobes  SAQ 2# 20x SLR 2# 10x BOSU (up) L step-up with B pole A 10x 8" FW step-up 10x with single pole A 2" stepover with B pole A 10x (good control today, mild pulling strain, "not pain")                         PT Short Term Goals - 06/24/15 1200    PT SHORT TERM GOAL #1   Title pt progresses to 0-90 L knee ROM by 06/30/15   Status Achieved  PT Long Term Goals - 07/11/15 1448    PT LONG TERM GOAL #1   Title L Knee AROM 0-130 or better by 07/20/15  L Knee AROM 4-126   Status On-going   PT LONG TERM GOAL #2   Title pt able to ambulate without need for AD or knee brace with good mechanics and distance not limited by knee pain by 07/20/15   Status Achieved   PT LONG TERM GOAL #3   Title pt able to return to ballroom dancing by 07/20/15   Status On-going               Plan - 07/11/15 1629    Clinical Impression Statement overall pt is progressing very well.  ROM is near normal.  Pt unable to get up/down from floor yet.  Initiated knee training today with performance of lunges on TRX - noted some short duration pain and unable to get knee to floor.  Ascending stairs is fine but eccentric control FW or BW still difficult but seems to improve with increasing reps.   PT Next Visit Plan Continue progressing stairs and lunges as able; patellar  mobes for pain; modalities PRN   Consulted and Agree with Plan of Care Patient        Problem List There are no active problems to display for this patient.   Vonya Ohalloran PT, OCS 07/11/2015, 4:32 PM  [facility] [address]  [place], [place], [address] Phone: [phone]   Fax:  [phone]

## 2015-07-14 ENCOUNTER — Ambulatory Visit: Payer: Commercial Managed Care - HMO | Admitting: Rehabilitation

## 2015-07-14 DIAGNOSIS — M25562 Pain in left knee: Secondary | ICD-10-CM

## 2015-07-14 DIAGNOSIS — R262 Difficulty in walking, not elsewhere classified: Secondary | ICD-10-CM | POA: Diagnosis not present

## 2015-07-14 DIAGNOSIS — M25662 Stiffness of left knee, not elsewhere classified: Secondary | ICD-10-CM | POA: Diagnosis not present

## 2015-07-14 NOTE — Therapy (Signed)
[facility] [address]  [place], [place], [address] Phone: [phone]   Fax:  [phone]  Physical Therapy Treatment  Patient Details  Name: Kylie Christensen MRN: [id number] Date of Birth: May 30, 1941 Referring Provider:  Cherylann Ratel, PA-C  Encounter Date: 07/14/2015      PT End of Session - 07/14/15 1406    Visit Number 9   Number of Visits 12   Date for PT Re-Evaluation 07/20/15   PT Start Time 3299   PT Stop Time 1442   PT Time Calculation (min) 39 min      Past Medical History  Diagnosis Date  . Hypothyroidism   . Anxiety   . GERD (gastroesophageal reflux disease)   . Headache   . Colitis     Past Surgical History  Procedure Laterality Date  . Back surgery  nov 2011    lower back  . Thyroid nodule removed  1970  . Abdominal hysterectomy      partial  . Tonsillectomy  age 60  . Biopsy breast Left yrs ago    benign  . Rotator cuffr Right   . Colonoscopy with propofol N/A 01/25/2015    Procedure: COLONOSCOPY WITH PROPOFOL;  Surgeon: Garlan Fair, MD;  [facility];  Service: Endoscopy;  Laterality: N/A;  . Esophagogastroduodenoscopy (egd) with propofol N/A 01/25/2015    Procedure: ESOPHAGOGASTRODUODENOSCOPY (EGD) WITH PROPOFOL;  Surgeon: Garlan Fair, MD;  [facility];  Service: Endoscopy;  Laterality: N/A;  . Balloon dilation N/A 01/25/2015    Procedure: BALLOON DILATION;  Surgeon: Garlan Fair, MD;  [facility];  Service: Endoscopy;  Laterality: N/A;    There were no vitals filed for this visit.  Visit Diagnosis:  Left knee pain  Knee stiffness, left  Difficulty walking      Subjective Assessment - 07/14/15 1405    Subjective States she gets more stiffness now thatn pain but isn't painfree yet. Reports she is no longer walking with an AD but still has problems with descending the stairs with reciprocal pattern. States she hasn't been taking  pain medication but needed to last Sunday and Tuesday due to high pain levels (unknown reasons) but reports pain levels are normally low.     Currently in Pain? Yes   Pain Score 1    Pain Location Knee   Pain Orientation Left;Medial      TODAY'S TREATMENT TherEx - Nustep lvl 5, 5' Leg Press 25# 20x 4" Eccentric Reach Downs 15x 2 pole assist TRX DL Squat 15x TRX Lunges 15x each (pain with Lt leg forward) - had Blue disc on floor and pt attempting to tap knee to pad (unable today) Side-Stepping Green TB at ankles 20' x 2 each way BOSU (up) L step-up with B pole A 15x BOSU (up) Lt Side Step-up with B pole A 10x Knee Flexion Machine DL 25# 10x Knee Extension Machine DL 15# 10x  MMTing: Lt Knee Flexion= 4+/5 Lt Knee Extension= 5-/5  Manual - L Patellar rotation mobes grade 2, L patellar grade 3 inferior and medial glides, L knee flexion mobes      PT Short Term Goals - 06/24/15 1200    PT SHORT TERM GOAL #1   Title pt progresses to 0-90 L knee ROM by 06/30/15   Status Achieved           PT Long Term Goals - 07/11/15 1448    PT LONG TERM  GOAL #1   Title L Knee AROM 0-130 or better by 07/20/15  L Knee AROM 4-126   Status On-going   PT LONG TERM GOAL #2   Title pt able to ambulate without need for AD or knee brace with good mechanics and distance not limited by knee pain by 07/20/15   Status Achieved   PT LONG TERM GOAL #3   Title pt able to return to ballroom dancing by 07/20/15   Status On-going               Plan - 07/14/15 1441    Clinical Impression Statement Pt continues to improve with all exercises and able to tolerate lunges with less pain today. MMTing was good today ranging from a 4+/5 to a 5-/5. Continues to lack TKE with measurement but gait is near normal now.    PT Next Visit Plan Continue progressing stairs and lunges as able; patellar mobes for pain; modalities PRN, increase HEP   Consulted and Agree with Plan of Care Patient        Problem  List There are no active problems to display for this patient.   [place], [place] 07/14/2015, 2:44 PM  [facility] [address]  [place], [place], [address] Phone: [phone]   Fax:  [phone]

## 2015-07-19 ENCOUNTER — Ambulatory Visit: Payer: Commercial Managed Care - HMO | Attending: Orthopedic Surgery | Admitting: Rehabilitation

## 2015-07-19 DIAGNOSIS — M25662 Stiffness of left knee, not elsewhere classified: Secondary | ICD-10-CM

## 2015-07-19 DIAGNOSIS — R262 Difficulty in walking, not elsewhere classified: Secondary | ICD-10-CM | POA: Insufficient documentation

## 2015-07-19 DIAGNOSIS — M25562 Pain in left knee: Secondary | ICD-10-CM | POA: Insufficient documentation

## 2015-07-19 NOTE — Therapy (Addendum)
[facility] [address]  [place], [place], [address] Phone: [phone]   Fax:  [phone]  Physical Therapy Treatment  Patient Details  Name: Kylie Christensen MRN: [id number] Date of Birth: [birth year] Referring Provider:  Cherylann Ratel, PA-C  Encounter Date: 07/19/2015      PT End of Session - 07/19/15 1404    Visit Number 10   Number of Visits 12   Date for PT Re-Evaluation 07/20/15   PT Start Time 1401   PT Stop Time 7654   PT Time Calculation (min) 44 min      Past Medical History  Diagnosis Date  . Hypothyroidism   . Anxiety   . GERD (gastroesophageal reflux disease)   . Headache   . Colitis     Past Surgical History  Procedure Laterality Date  . Back surgery  nov 2011    lower back  . Thyroid nodule removed  1970  . Abdominal hysterectomy      partial  . Tonsillectomy  age 58  . Biopsy breast Left yrs ago    benign  . Rotator cuffr Right   . Colonoscopy with propofol N/A 01/25/2015    Procedure: COLONOSCOPY WITH PROPOFOL;  Surgeon: Garlan Fair, MD;  [facility];  Service: Endoscopy;  Laterality: N/A;  . Esophagogastroduodenoscopy (egd) with propofol N/A 01/25/2015    Procedure: ESOPHAGOGASTRODUODENOSCOPY (EGD) WITH PROPOFOL;  Surgeon: Garlan Fair, MD;  [facility];  Service: Endoscopy;  Laterality: N/A;  . Balloon dilation N/A 01/25/2015    Procedure: BALLOON DILATION;  Surgeon: Garlan Fair, MD;  [facility];  Service: Endoscopy;  Laterality: N/A;    There were no vitals filed for this visit.  Visit Diagnosis:  Left knee pain  Knee stiffness, left      Subjective Assessment - 07/19/15 1404    Subjective Saw MD yesterday and he said to check back in 6 weeks if she needs to. He talked about her going to work out at [employer] and to be careful with the weights since they are most likely different brands. Had some muscle spasms on Sunday for  unknown reason.       TODAY'S TREATMENT TherEx - Nustep lvl 5, 5' Knee Flexion Machine DL 25# 15x Knee Extension Machine DL 15# 15x BOSU (up) L step-up with B pole A 15x BOSU (down) L step-up with B pole A 3x (stopped due to medial knee pain) TRX DL Squat 15x Tandem walking on Blue Balance beam 3x fwd/bwd (occasional HHA) Side Stepping on Blue Balance beam 4x each way SLS on Blue disk with Rt toe taps to cones in a quarter circle 5x each cone (occasional HHA) Alt toe tapping to 8" step from Blue Foam 10x no HHA 4" Eccentric Reach Downs 15x 2 pole assist  Manual - L Patellar rotation mobes grade 2, L patellar grade 3 inferior and medial glides, L knee flexion mobes        PT Short Term Goals - 06/24/15 1200    PT SHORT TERM GOAL #1   Title pt progresses to 0-90 L knee ROM by 06/30/15   Status Achieved           PT Long Term Goals - 07/11/15 1448    PT LONG TERM GOAL #1   Title L Knee AROM 0-130 or better by 07/20/15  L Knee AROM 4-126   Status On-going   PT LONG TERM GOAL #  2   Title pt able to ambulate without need for AD or knee brace with good mechanics and distance not limited by knee pain by 07/20/15   Status Achieved   PT LONG TERM GOAL #3   Title pt able to return to ballroom dancing by 07/20/15   Status On-going               Plan - 08-12-15 1454    Clinical Impression Statement Focused more on balance today due to pt stating her balance isn't as good as before. No complaint of pain with balance work but noted pains with BOSU (down) step ups so stopped those and switched with no complain of pain afterwards. Continues to have difficulty with descending stairs and pt states this is her last hurddle before she feels back to normal.    PT Next Visit Plan Continue progressing stairs and lunges as able; patellar mobes for pain; modalities PRN, increase HEP   Consulted and Agree with Plan of Care Patient          G-Codes - 12-Aug-2015 1458    Functional Assessment  Tool Used FOTO 42% limitation   Functional Limitation Mobility: Walking and moving around   Mobility: Walking and Moving Around Current Status [phone]) At least 40 percent but less than 60 percent impaired, limited or restricted   Mobility: Walking and Moving Around Goal Status [phone]) At least 40 percent but less than 60 percent impaired, limited or restricted      Problem List There are no active problems to display for this patient.   [place], PTA 08/12/15, 5:48 PM  Leonette Most PT, OCS 12-Aug-2015 5:48 PM   [facility] [address]  [place], [place], [address] Phone: [phone]   Fax:  [phone]

## 2015-07-21 ENCOUNTER — Ambulatory Visit: Payer: Commercial Managed Care - HMO | Admitting: Physical Therapy

## 2015-07-21 DIAGNOSIS — M25662 Stiffness of left knee, not elsewhere classified: Secondary | ICD-10-CM

## 2015-07-21 DIAGNOSIS — M25562 Pain in left knee: Secondary | ICD-10-CM

## 2015-07-21 DIAGNOSIS — R262 Difficulty in walking, not elsewhere classified: Secondary | ICD-10-CM | POA: Diagnosis not present

## 2015-07-21 NOTE — Therapy (Addendum)
[facility] [address]  [place], [place], [address] Phone: [phone]   Fax:  [phone]  Physical Therapy Treatment  Patient Details  Name: Kylie Christensen MRN: [id number] Date of Birth: 04-10-1941 Referring Provider:  Cherylann Ratel, PA-C  Encounter Date: 07/21/2015      PT End of Session - 07/21/15 1409    Visit Number 11   Number of Visits 12   PT Start Time 1402   PT Stop Time 7634   PT Time Calculation (min) 56 min      Past Medical History  Diagnosis Date  . Hypothyroidism   . Anxiety   . GERD (gastroesophageal reflux disease)   . Headache   . Colitis     Past Surgical History  Procedure Laterality Date  . Back surgery  nov 2011    lower back  . Thyroid nodule removed  1970  . Abdominal hysterectomy      partial  . Tonsillectomy  age 74  . Biopsy breast Left yrs ago    benign  . Rotator cuffr Right   . Colonoscopy with propofol N/A 01/25/2015    Procedure: COLONOSCOPY WITH PROPOFOL;  Surgeon: Garlan Fair, MD;  Location: [facility];  Service: Endoscopy;  Laterality: N/A;  . Esophagogastroduodenoscopy (egd) with propofol N/A 01/25/2015    Procedure: ESOPHAGOGASTRODUODENOSCOPY (EGD) WITH PROPOFOL;  Surgeon: Garlan Fair, MD;  Location: [facility];  Service: Endoscopy;  Laterality: N/A;  . Balloon dilation N/A 01/25/2015    Procedure: BALLOON DILATION;  Surgeon: Garlan Fair, MD;  Location: [facility];  Service: Endoscopy;  Laterality: N/A;    There were no vitals filed for this visit.  Visit Diagnosis:  Left knee pain  Knee stiffness, left  Difficulty walking      Subjective Assessment - 07/21/15 1404    Subjective Pt states she fell at home over the weekend and struck R lower ribs.  Rib fracture r/o by MD on Monday but pt states is still quite sore. States knee is doing "pretty good" overall.  A little stiff and sore in the mornings. States has been performing  current HEP.  States hasn't returned to ballroom dancing yet.   How long can you stand comfortably? no limited by pain   How long can you walk comfortably? not limited by pain   Currently in Pain? Yes   Pain Score --  states AVG pain has been 1-2/10 in L knee lately   Pain Location Knee   Pain Orientation Left            [facility] PT Assessment - 07/21/15 0001    AROM   Overall AROM Comments L Knee 0-132   PROM   Overall PROM Comments L Knee 0-140   Strength   Left Knee Flexion 4+/5  no pain   Left Knee Extension 5/5  no pain          TODAY'S TREATMENT AROM, PROM, and MMT L Knee TherEx - Bridge on Heels 15x L SL Bridge 6x TRX Lunge (attempting to tap knee onto Blue disc) 6x each leg - mild pain in L knee when attempting to tap L knee, weakness in L knee when tapping R knee Then performed same at sink for HEP practice 8x each 8" L FW step-up with light R forefoot strike 12x (no pole A) 4" L stepover with light R foot contact 10x with single pole A (attempted with 6" but unable  due to L knee pain) Leg Press 35# 20x Balance Training: tandem stance training on open floor and in corner (instruct in HEP). Performed with EO, EC, and with c-spine AROM as progression options for HEP.                    PT Education - 07/21/15 1503    Education provided Yes   Education Details HEP progression   Person(s) Educated Patient   Methods Explanation;Demonstration;Handout   Comprehension Verbalized understanding;Returned demonstration          PT Short Term Goals - 06/24/15 1200    PT SHORT TERM GOAL #1   Title pt progresses to 0-90 L knee ROM by 06/30/15   Status Achieved           PT Long Term Goals - 07/21/15 1414    PT LONG TERM GOAL #1   Title L Knee AROM 0-130 or better by 07/20/15   Status Achieved   PT LONG TERM GOAL #2   Title pt able to ambulate without need for AD or knee brace with good mechanics and distance not limited by knee pain by 07/20/15    Status Achieved   PT LONG TERM GOAL #3   Title pt able to return to ballroom dancing by 07/20/15   Status On-going        G-Code - mobility, walking around Current and Discharge 40-60% limitation (CK), goal was CK.       Plan - 07/21/15 1614    Clinical Impression Statement pt very good progress with ROM and strength.  She feels comfortable to performing HEP and going to the gym and progressing independently for now.  Current limitations discussed today include difficulty with descending stairs (pain and weakness/lack of control) and inability to get up/down from the floor.  Practiced 1/2 kneeling today with TRX and counter assist and pt is going to include this with HEP to work on being able to get up/down from floor.  In addition, instructed in balance training ideas and progressions.  Overall, pt with strong understanding and highly motivated so likely to progress independtly well.  She is to contact us if she has increase in pain or feels she requires additional assistance.   PT Next Visit Plan on hold up to 30 days.  If does not return will discharge by 08/20/15   Consulted and Agree with Plan of Care Patient        Problem List There are no active problems to display for this patient.   Adesuwa Osgood PT, OCS 07/21/2015, 4:18 PM  [facility] [address]  [place], [place], [address] Phone: [phone]   Fax:  [phone]     PHYSICAL THERAPY DISCHARGE SUMMARY  Visits from Start of Care: 11  Current functional level related to goals / functional outcomes: Goals met other than return to dancing but pt independent with continued progress   Remaining deficits: Intermittent knee pain, mild knee weakness   Education / Equipment: HEP Plan: Patient agrees to discharge.  Patient goals were partially met. Patient is being discharged due to being pleased with the current functional level.  ?????        Mrs.  Kosloski was last seen on 07/21/15 at which time she had progressed very well and placed on hold from PT as she performed her HEP.  She was to contact us if pain returned and she required additional care.  We haven't  heard from her and she is therefore being discharged at this time.  Leonette Most PT, OCS 09/01/2015 9:12 AM

## 2015-09-15 DIAGNOSIS — R51 Headache: Secondary | ICD-10-CM | POA: Diagnosis not present

## 2015-09-15 DIAGNOSIS — J069 Acute upper respiratory infection, unspecified: Secondary | ICD-10-CM | POA: Diagnosis not present

## 2015-11-16 DIAGNOSIS — H47012 Ischemic optic neuropathy, left eye: Secondary | ICD-10-CM | POA: Diagnosis not present

## 2015-11-16 DIAGNOSIS — H25811 Combined forms of age-related cataract, right eye: Secondary | ICD-10-CM | POA: Diagnosis not present

## 2015-11-16 DIAGNOSIS — H04123 Dry eye syndrome of bilateral lacrimal glands: Secondary | ICD-10-CM | POA: Diagnosis not present

## 2015-11-16 DIAGNOSIS — Z961 Presence of intraocular lens: Secondary | ICD-10-CM | POA: Diagnosis not present

## 2015-12-14 DIAGNOSIS — H25811 Combined forms of age-related cataract, right eye: Secondary | ICD-10-CM | POA: Diagnosis not present

## 2015-12-14 DIAGNOSIS — H2511 Age-related nuclear cataract, right eye: Secondary | ICD-10-CM | POA: Diagnosis not present

## 2016-01-06 DIAGNOSIS — Z0001 Encounter for general adult medical examination with abnormal findings: Secondary | ICD-10-CM | POA: Diagnosis not present

## 2016-01-06 DIAGNOSIS — R5382 Chronic fatigue, unspecified: Secondary | ICD-10-CM | POA: Diagnosis not present

## 2016-01-06 DIAGNOSIS — L309 Dermatitis, unspecified: Secondary | ICD-10-CM | POA: Diagnosis not present

## 2016-01-06 DIAGNOSIS — R1011 Right upper quadrant pain: Secondary | ICD-10-CM | POA: Diagnosis not present

## 2016-01-06 DIAGNOSIS — Z79899 Other long term (current) drug therapy: Secondary | ICD-10-CM | POA: Diagnosis not present

## 2016-01-06 DIAGNOSIS — E039 Hypothyroidism, unspecified: Secondary | ICD-10-CM | POA: Diagnosis not present

## 2016-01-06 DIAGNOSIS — E782 Mixed hyperlipidemia: Secondary | ICD-10-CM | POA: Diagnosis not present

## 2016-01-06 DIAGNOSIS — Z1389 Encounter for screening for other disorder: Secondary | ICD-10-CM | POA: Diagnosis not present

## 2016-01-06 DIAGNOSIS — Z23 Encounter for immunization: Secondary | ICD-10-CM | POA: Diagnosis not present

## 2016-01-06 DIAGNOSIS — F329 Major depressive disorder, single episode, unspecified: Secondary | ICD-10-CM | POA: Diagnosis not present

## 2016-01-06 DIAGNOSIS — E559 Vitamin D deficiency, unspecified: Secondary | ICD-10-CM | POA: Diagnosis not present

## 2016-01-10 DIAGNOSIS — Z01419 Encounter for gynecological examination (general) (routine) without abnormal findings: Secondary | ICD-10-CM | POA: Diagnosis not present

## 2016-01-10 DIAGNOSIS — Z1231 Encounter for screening mammogram for malignant neoplasm of breast: Secondary | ICD-10-CM | POA: Diagnosis not present

## 2016-01-10 DIAGNOSIS — N958 Other specified menopausal and perimenopausal disorders: Secondary | ICD-10-CM | POA: Diagnosis not present

## 2016-01-10 DIAGNOSIS — Z6831 Body mass index (BMI) 31.0-31.9, adult: Secondary | ICD-10-CM | POA: Diagnosis not present

## 2016-01-11 DIAGNOSIS — E042 Nontoxic multinodular goiter: Secondary | ICD-10-CM | POA: Diagnosis not present

## 2016-01-11 DIAGNOSIS — E039 Hypothyroidism, unspecified: Secondary | ICD-10-CM | POA: Diagnosis not present

## 2016-01-21 ENCOUNTER — Encounter (HOSPITAL_COMMUNITY): Admission: EM | Disposition: A | Discharge status: Home / Self Care | Payer: Self-pay | Source: Home / Self Care

## 2016-01-21 ENCOUNTER — Encounter (HOSPITAL_COMMUNITY)
Admission: EM | Disposition: A | Discharge status: Home / Self Care | Payer: Self-pay | Source: Home / Self Care | Attending: Emergency Medicine

## 2016-01-21 ENCOUNTER — Encounter (HOSPITAL_COMMUNITY): Payer: Self-pay | Admitting: Anesthesiology

## 2016-01-21 ENCOUNTER — Ambulatory Visit (HOSPITAL_COMMUNITY)
Admission: EM | Admit: 2016-01-21 | Discharge: 2016-01-22 | Disposition: A | Discharge status: Home / Self Care | Payer: PPO | Source: Home / Self Care | Admitting: Orthopedic Surgery

## 2016-01-21 ENCOUNTER — Emergency Department (HOSPITAL_COMMUNITY): Payer: PPO

## 2016-01-21 ENCOUNTER — Emergency Department (HOSPITAL_COMMUNITY)
Admission: EM | Admit: 2016-01-21 | Discharge: 2016-01-21 | Disposition: A | Discharge status: Home / Self Care | Payer: PPO | Attending: Orthopedic Surgery | Admitting: Orthopedic Surgery

## 2016-01-21 ENCOUNTER — Emergency Department (HOSPITAL_COMMUNITY): Payer: PPO | Admitting: Anesthesiology

## 2016-01-21 ENCOUNTER — Encounter (HOSPITAL_COMMUNITY): Payer: Self-pay | Admitting: *Deleted

## 2016-01-21 DIAGNOSIS — Y9389 Activity, other specified: Secondary | ICD-10-CM | POA: Insufficient documentation

## 2016-01-21 DIAGNOSIS — Z79899 Other long term (current) drug therapy: Secondary | ICD-10-CM | POA: Insufficient documentation

## 2016-01-21 DIAGNOSIS — S52602A Unspecified fracture of lower end of left ulna, initial encounter for closed fracture: Secondary | ICD-10-CM

## 2016-01-21 DIAGNOSIS — S52502A Unspecified fracture of the lower end of left radius, initial encounter for closed fracture: Secondary | ICD-10-CM

## 2016-01-21 DIAGNOSIS — Y92009 Unspecified place in unspecified non-institutional (private) residence as the place of occurrence of the external cause: Secondary | ICD-10-CM | POA: Insufficient documentation

## 2016-01-21 DIAGNOSIS — F419 Anxiety disorder, unspecified: Secondary | ICD-10-CM | POA: Insufficient documentation

## 2016-01-21 DIAGNOSIS — S52592A Other fractures of lower end of left radius, initial encounter for closed fracture: Secondary | ICD-10-CM | POA: Insufficient documentation

## 2016-01-21 DIAGNOSIS — E669 Obesity, unspecified: Secondary | ICD-10-CM | POA: Insufficient documentation

## 2016-01-21 DIAGNOSIS — K219 Gastro-esophageal reflux disease without esophagitis: Secondary | ICD-10-CM | POA: Insufficient documentation

## 2016-01-21 DIAGNOSIS — Z880 Allergy status to penicillin: Secondary | ICD-10-CM | POA: Insufficient documentation

## 2016-01-21 DIAGNOSIS — S52692A Other fracture of lower end of left ulna, initial encounter for closed fracture: Secondary | ICD-10-CM | POA: Insufficient documentation

## 2016-01-21 DIAGNOSIS — G8918 Other acute postprocedural pain: Secondary | ICD-10-CM | POA: Diagnosis not present

## 2016-01-21 DIAGNOSIS — Y998 Other external cause status: Secondary | ICD-10-CM

## 2016-01-21 DIAGNOSIS — S52572A Other intraarticular fracture of lower end of left radius, initial encounter for closed fracture: Secondary | ICD-10-CM | POA: Insufficient documentation

## 2016-01-21 DIAGNOSIS — W010XXA Fall on same level from slipping, tripping and stumbling without subsequent striking against object, initial encounter: Secondary | ICD-10-CM | POA: Insufficient documentation

## 2016-01-21 DIAGNOSIS — S52572B Other intraarticular fracture of lower end of left radius, initial encounter for open fracture type I or II: Secondary | ICD-10-CM | POA: Diagnosis not present

## 2016-01-21 DIAGNOSIS — W19XXXA Unspecified fall, initial encounter: Secondary | ICD-10-CM | POA: Diagnosis not present

## 2016-01-21 DIAGNOSIS — E039 Hypothyroidism, unspecified: Secondary | ICD-10-CM | POA: Insufficient documentation

## 2016-01-21 HISTORY — PX: OPEN REDUCTION INTERNAL FIXATION (ORIF) DISTAL RADIAL FRACTURE: SHX5989

## 2016-01-21 LAB — BASIC METABOLIC PANEL
Anion gap: 14 (ref 5–15)
BUN: 20 mg/dL (ref 6–20)
CHLORIDE: 101 mmol/L (ref 101–111)
CO2: 22 mmol/L (ref 22–32)
CREATININE: 1.14 mg/dL — AB (ref 0.44–1.00)
Calcium: 9.3 mg/dL (ref 8.9–10.3)
GFR, EST AFRICAN AMERICAN: 53 mL/min — AB (ref >60)
GFR, EST NON AFRICAN AMERICAN: 46 mL/min — AB (ref >60)
Glucose, Bld: 128 mg/dL — ABNORMAL HIGH (ref 65–99)
POTASSIUM: 4 mmol/L (ref 3.5–5.1)
SODIUM: 137 mmol/L (ref 135–145)

## 2016-01-21 LAB — CBC
HCT: 48.9 % — ABNORMAL HIGH (ref 36.0–46.0)
HEMOGLOBIN: 15.9 g/dL — AB (ref 12.0–15.0)
MCH: 31.8 pg (ref 26.0–34.0)
MCHC: 32.5 g/dL (ref 30.0–36.0)
MCV: 97.8 fL (ref 78.0–100.0)
PLATELETS: 256 10*3/uL (ref 150–400)
RBC: 5 MIL/uL (ref 3.87–5.11)
RDW: 14.1 % (ref 11.5–15.5)
WBC: 7.1 10*3/uL (ref 4.0–10.5)

## 2016-01-21 SURGERY — OPEN REDUCTION INTERNAL FIXATION (ORIF) DISTAL RADIUS FRACTURE
Anesthesia: General | Laterality: Left

## 2016-01-21 MED ORDER — HYDROMORPHONE HCL 1 MG/ML IJ SOLN
0.5000 mg | Freq: Once | INTRAMUSCULAR | Status: AC
  Administered 2016-01-21: 0.5 mg via INTRAVENOUS
  Filled 2016-01-21: qty 1

## 2016-01-21 MED ORDER — HYDROCODONE-ACETAMINOPHEN 5-325 MG PO TABS
1.0000 | ORAL_TABLET | ORAL | Status: DC | PRN
  Administered 2016-01-22: 1 via ORAL
  Administered 2016-01-22: 2 via ORAL
  Filled 2016-01-21: qty 2
  Filled 2016-01-21: qty 1

## 2016-01-21 MED ORDER — DEXTROSE 5 % IV SOLN
10.0000 mg | INTRAVENOUS | Status: DC | PRN
  Administered 2016-01-21: 60 ug/min via INTRAVENOUS

## 2016-01-21 MED ORDER — FLUOXETINE HCL 20 MG PO CAPS: 20.0000 mg | ORAL_CAPSULE | Freq: Every day | ORAL | Status: DC

## 2016-01-21 MED ORDER — DOCUSATE SODIUM 100 MG PO CAPS: 100.0000 mg | ORAL_CAPSULE | Freq: Two times a day (BID) | ORAL | Status: DC

## 2016-01-21 MED ORDER — OCUVITE PO TABS: 1.0000 | ORAL_TABLET | Freq: Every day | ORAL | Status: DC

## 2016-01-21 MED ORDER — 0.9 % SODIUM CHLORIDE (POUR BTL) OPTIME
TOPICAL | Status: DC | PRN
Start: 2016-01-21 — End: 2016-01-21
  Administered 2016-01-21: 200 mL

## 2016-01-21 MED ORDER — MIDAZOLAM HCL 2 MG/2ML IJ SOLN
INTRAMUSCULAR | Status: AC
Start: 2016-01-21 — End: 2016-01-21
  Filled 2016-01-21: qty 2

## 2016-01-21 MED ORDER — PROPOFOL 10 MG/ML IV BOLUS
INTRAVENOUS | Status: DC | PRN
  Administered 2016-01-21: 160 mg via INTRAVENOUS

## 2016-01-21 MED ORDER — CEFAZOLIN SODIUM 1-5 GM-% IV SOLN: 1.0000 g | INTRAVENOUS | Status: DC

## 2016-01-21 MED ORDER — MEPERIDINE HCL 25 MG/ML IJ SOLN
INTRAMUSCULAR | Status: AC
  Filled 2016-01-21: qty 1

## 2016-01-21 MED ORDER — VITAMIN C 500 MG PO TABS
1000.0000 mg | ORAL_TABLET | Freq: Every day | ORAL | Status: DC
  Administered 2016-01-22: 1000 mg via ORAL
  Filled 2016-01-21: qty 2

## 2016-01-21 MED ORDER — ADULT MULTIVITAMIN W/MINERALS CH
1.0000 | ORAL_TABLET | Freq: Every day | ORAL | Status: DC
  Administered 2016-01-22: 1 via ORAL
  Filled 2016-01-21: qty 1

## 2016-01-21 MED ORDER — ZOLPIDEM TARTRATE 5 MG PO TABS
5.0000 mg | ORAL_TABLET | Freq: Every evening | ORAL | Status: DC | PRN
Start: 2016-01-21 — End: 2016-01-22

## 2016-01-21 MED ORDER — ONDANSETRON HCL 4 MG/2ML IJ SOLN
4.0000 mg | Freq: Once | INTRAMUSCULAR | Status: AC
  Administered 2016-01-21: 4 mg via INTRAVENOUS
  Filled 2016-01-21: qty 2

## 2016-01-21 MED ORDER — KCL IN DEXTROSE-NACL 20-5-0.45 MEQ/L-%-% IV SOLN
INTRAVENOUS | Status: AC
  Filled 2016-01-21: qty 1000

## 2016-01-21 MED ORDER — METHOCARBAMOL 500 MG PO TABS: 500.0000 mg | ORAL_TABLET | Freq: Four times a day (QID) | ORAL | Status: DC | PRN

## 2016-01-21 MED ORDER — ONDANSETRON HCL 4 MG PO TABS: 4.0000 mg | ORAL_TABLET | Freq: Four times a day (QID) | ORAL | Status: DC | PRN

## 2016-01-21 MED ORDER — MORPHINE SULFATE (PF) 2 MG/ML IV SOLN
1.0000 mg | INTRAVENOUS | Status: DC | PRN
Start: 2016-01-21 — End: 2016-01-22

## 2016-01-21 MED ORDER — HYDROMORPHONE HCL 1 MG/ML IJ SOLN
0.2500 mg | INTRAMUSCULAR | Status: DC | PRN
  Administered 2016-01-21: 0.5 mg via INTRAVENOUS

## 2016-01-21 MED ORDER — BUPIVACAINE-EPINEPHRINE (PF) 0.5% -1:200000 IJ SOLN
INTRAMUSCULAR | Status: DC | PRN
  Administered 2016-01-21: 30 mL via PERINEURAL

## 2016-01-21 MED ORDER — VITAMIN C 500 MG PO TABS: 500.0000 mg | ORAL_TABLET | Freq: Every day | ORAL | Status: DC

## 2016-01-21 MED ORDER — VANCOMYCIN HCL IN DEXTROSE 1-5 GM/200ML-% IV SOLN
INTRAVENOUS | Status: AC
  Filled 2016-01-21: qty 200

## 2016-01-21 MED ORDER — OMEGA-3-ACID ETHYL ESTERS 1 G PO CAPS: 2.0000 g | ORAL_CAPSULE | Freq: Every day | ORAL | Status: DC

## 2016-01-21 MED ORDER — CALCIUM CARBONATE 1250 (500 CA) MG PO TABS: 1.0000 | ORAL_TABLET | Freq: Every day | ORAL | Status: DC

## 2016-01-21 MED ORDER — PROPOFOL 10 MG/ML IV BOLUS
INTRAVENOUS | Status: AC
  Filled 2016-01-21: qty 20

## 2016-01-21 MED ORDER — CYCLOSPORINE 0.05 % OP EMUL: 1.0000 [drp] | Freq: Two times a day (BID) | OPHTHALMIC | Status: DC

## 2016-01-21 MED ORDER — ONDANSETRON HCL 4 MG/2ML IJ SOLN
4.0000 mg | Freq: Four times a day (QID) | INTRAMUSCULAR | Status: DC | PRN
Start: 2016-01-21 — End: 2016-01-22

## 2016-01-21 MED ORDER — DIPHENHYDRAMINE HCL 25 MG PO CAPS: 25.0000 mg | ORAL_CAPSULE | Freq: Four times a day (QID) | ORAL | Status: DC | PRN

## 2016-01-21 MED ORDER — OXYCODONE HCL 5 MG/5ML PO SOLN: 5.0000 mg | Freq: Once | ORAL | Status: DC | PRN

## 2016-01-21 MED ORDER — ONDANSETRON HCL 4 MG/2ML IJ SOLN
4.0000 mg | Freq: Four times a day (QID) | INTRAMUSCULAR | Status: DC | PRN
Start: 2016-01-21 — End: 2016-01-21

## 2016-01-21 MED ORDER — SUCCINYLCHOLINE CHLORIDE 20 MG/ML IJ SOLN
INTRAMUSCULAR | Status: AC
  Filled 2016-01-21: qty 1

## 2016-01-21 MED ORDER — SCOPOLAMINE 1 MG/3DAYS TD PT72
MEDICATED_PATCH | TRANSDERMAL | Status: AC
  Administered 2016-01-21: 1 via TRANSDERMAL
  Filled 2016-01-21: qty 1

## 2016-01-21 MED ORDER — CEFAZOLIN SODIUM-DEXTROSE 2-3 GM-% IV SOLR
INTRAVENOUS | Status: DC | PRN
  Administered 2016-01-21: 2 g via INTRAVENOUS

## 2016-01-21 MED ORDER — ADULT MULTIVITAMIN W/MINERALS CH: 1.0000 | ORAL_TABLET | Freq: Every day | ORAL | Status: DC

## 2016-01-21 MED ORDER — FENTANYL CITRATE (PF) 250 MCG/5ML IJ SOLN
INTRAMUSCULAR | Status: AC
Start: 2016-01-21 — End: 2016-01-21
  Filled 2016-01-21: qty 5

## 2016-01-21 MED ORDER — PHENYLEPHRINE HCL 10 MG/ML IJ SOLN
INTRAMUSCULAR | Status: DC | PRN
  Administered 2016-01-21 (×3): 80 ug via INTRAVENOUS

## 2016-01-21 MED ORDER — METHOCARBAMOL 1000 MG/10ML IJ SOLN
500.0000 mg | Freq: Four times a day (QID) | INTRAVENOUS | Status: DC | PRN
  Filled 2016-01-21: qty 5

## 2016-01-21 MED ORDER — METHOCARBAMOL 500 MG PO TABS: 500.0000 mg | ORAL_TABLET | Freq: Four times a day (QID) | ORAL | Status: DC

## 2016-01-21 MED ORDER — LEVOTHYROXINE SODIUM 25 MCG PO TABS: 25.0000 ug | ORAL_TABLET | ORAL | Status: DC

## 2016-01-21 MED ORDER — B COMPLEX-C PO TABS: 1.0000 | ORAL_TABLET | Freq: Every day | ORAL | Status: DC

## 2016-01-21 MED ORDER — HYDROMORPHONE HCL 1 MG/ML IJ SOLN
INTRAMUSCULAR | Status: AC
  Filled 2016-01-21: qty 1

## 2016-01-21 MED ORDER — LACTATED RINGERS IV SOLN
INTRAVENOUS | Status: DC | PRN
Start: 2016-01-21 — End: 2016-01-21
  Administered 2016-01-21 (×2): via INTRAVENOUS

## 2016-01-21 MED ORDER — MEPERIDINE HCL 25 MG/ML IJ SOLN
6.2500 mg | INTRAMUSCULAR | Status: DC | PRN
  Administered 2016-01-21 (×2): 6.25 mg via INTRAVENOUS

## 2016-01-21 MED ORDER — OXYCODONE HCL 5 MG PO TABS: 5.0000 mg | ORAL_TABLET | Freq: Once | ORAL | Status: DC | PRN

## 2016-01-21 MED ORDER — KCL IN DEXTROSE-NACL 20-5-0.45 MEQ/L-%-% IV SOLN
INTRAVENOUS | Status: DC
  Administered 2016-01-21: 20:00:00 via INTRAVENOUS

## 2016-01-21 MED ORDER — MIDAZOLAM HCL 2 MG/2ML IJ SOLN
INTRAMUSCULAR | Status: DC | PRN
  Administered 2016-01-21: 2 mg via INTRAVENOUS

## 2016-01-21 MED ORDER — LIDOCAINE HCL (CARDIAC) 20 MG/ML IV SOLN
INTRAVENOUS | Status: DC | PRN
  Administered 2016-01-21: 60 mg via INTRATRACHEAL

## 2016-01-21 MED ORDER — SENNOSIDES-DOCUSATE SODIUM 8.6-50 MG PO TABS: 1.0000 | ORAL_TABLET | Freq: Every evening | ORAL | Status: DC | PRN

## 2016-01-21 MED ORDER — ONDANSETRON HCL 4 MG/2ML IJ SOLN
INTRAMUSCULAR | Status: AC
Start: 2016-01-21 — End: 2016-01-21
  Filled 2016-01-21: qty 2

## 2016-01-21 MED ORDER — OXYCODONE-ACETAMINOPHEN 5-325 MG PO TABS: 1.0000 | ORAL_TABLET | ORAL | Status: DC | PRN

## 2016-01-21 MED ORDER — VITAMIN D3 25 MCG (1000 UNIT) PO TABS: 2000.0000 [IU] | ORAL_TABLET | Freq: Every day | ORAL | Status: DC

## 2016-01-21 MED ORDER — LIDOCAINE HCL (CARDIAC) 20 MG/ML IV SOLN
INTRAVENOUS | Status: AC
  Filled 2016-01-21: qty 5

## 2016-01-21 MED ORDER — FENTANYL CITRATE (PF) 250 MCG/5ML IJ SOLN
INTRAMUSCULAR | Status: DC | PRN
  Administered 2016-01-21: 50 ug via INTRAVENOUS

## 2016-01-21 MED ORDER — DEXAMETHASONE SODIUM PHOSPHATE 4 MG/ML IJ SOLN
INTRAMUSCULAR | Status: DC | PRN
  Administered 2016-01-21: 4 mg via INTRAVENOUS

## 2016-01-21 MED ORDER — ONDANSETRON HCL 4 MG/2ML IJ SOLN
INTRAMUSCULAR | Status: DC | PRN
  Administered 2016-01-21: 4 mg via INTRAVENOUS

## 2016-01-21 MED ORDER — CEFAZOLIN SODIUM 1-5 GM-% IV SOLN
1.0000 g | Freq: Three times a day (TID) | INTRAVENOUS | Status: DC
  Administered 2016-01-22 (×3): 1 g via INTRAVENOUS
  Filled 2016-01-21 (×5): qty 50

## 2016-01-21 SURGICAL SUPPLY — 67 items
BANDAGE ACE 3X5.8 VEL STRL LF (GAUZE/BANDAGES/DRESSINGS) ×1 IMPLANT
BANDAGE ELASTIC 3 VELCRO ST LF (GAUZE/BANDAGES/DRESSINGS) ×2 IMPLANT
BANDAGE ELASTIC 4 VELCRO ST LF (GAUZE/BANDAGES/DRESSINGS) ×2 IMPLANT
BIT DRILL 2.2 SS TIBIAL (BIT) ×1 IMPLANT
BLADE SURG ROTATE 9660 (MISCELLANEOUS) IMPLANT
BNDG CMPR 9X4 STRL LF SNTH (GAUZE/BANDAGES/DRESSINGS) ×1
BNDG ESMARK 4X9 LF (GAUZE/BANDAGES/DRESSINGS) ×2 IMPLANT
BNDG GAUZE ELAST 4 BULKY (GAUZE/BANDAGES/DRESSINGS) ×2 IMPLANT
CANISTER SUCTION 2500CC (MISCELLANEOUS) ×2 IMPLANT
CORDS BIPOLAR (ELECTRODE) ×2 IMPLANT
COVER SURGICAL LIGHT HANDLE (MISCELLANEOUS) ×2 IMPLANT
CUFF TOURNIQUET SINGLE 18IN (TOURNIQUET CUFF) ×2 IMPLANT
CUFF TOURNIQUET SINGLE 24IN (TOURNIQUET CUFF) IMPLANT
DECANTER SPIKE VIAL GLASS SM (MISCELLANEOUS) ×2 IMPLANT
DRAPE OEC MINIVIEW 54X84 (DRAPES) ×2 IMPLANT
DRAPE SURG 17X11 SM STRL (DRAPES) ×2 IMPLANT
DRSG ADAPTIC 3X8 NADH LF (GAUZE/BANDAGES/DRESSINGS) ×2 IMPLANT
GAUZE SPONGE 4X4 12PLY STRL (GAUZE/BANDAGES/DRESSINGS) ×2 IMPLANT
GAUZE SPONGE 4X4 16PLY XRAY LF (GAUZE/BANDAGES/DRESSINGS) ×2 IMPLANT
GLOVE BIOGEL PI IND STRL 8.5 (GLOVE) ×1 IMPLANT
GLOVE BIOGEL PI INDICATOR 8.5 (GLOVE) ×1
GLOVE SURG ORTHO 8.0 STRL STRW (GLOVE) ×2 IMPLANT
GOWN STRL REUS W/ TWL LRG LVL3 (GOWN DISPOSABLE) ×1 IMPLANT
GOWN STRL REUS W/ TWL XL LVL3 (GOWN DISPOSABLE) ×1 IMPLANT
GOWN STRL REUS W/TWL LRG LVL3 (GOWN DISPOSABLE) ×2
GOWN STRL REUS W/TWL XL LVL3 (GOWN DISPOSABLE) ×2
K-WIRE 1.6 (WIRE) ×2
K-WIRE FX5X1.6XNS BN SS (WIRE) ×1
KIT BASIN OR (CUSTOM PROCEDURE TRAY) ×2 IMPLANT
KIT ROOM TURNOVER OR (KITS) ×2 IMPLANT
KWIRE FX5X1.6XNS BN SS (WIRE) IMPLANT
NDL HYPO 25X1 1.5 SAFETY (NEEDLE) ×1 IMPLANT
NEEDLE HYPO 25X1 1.5 SAFETY (NEEDLE) ×2 IMPLANT
NS IRRIG 1000ML POUR BTL (IV SOLUTION) ×2 IMPLANT
PACK ORTHO EXTREMITY (CUSTOM PROCEDURE TRAY) ×2 IMPLANT
PAD ARMBOARD 7.5X6 YLW CONV (MISCELLANEOUS) ×4 IMPLANT
PAD CAST 3X4 CTTN HI CHSV (CAST SUPPLIES) IMPLANT
PAD CAST 4YDX4 CTTN HI CHSV (CAST SUPPLIES) ×1 IMPLANT
PADDING CAST COTTON 3X4 STRL (CAST SUPPLIES) ×2
PADDING CAST COTTON 4X4 STRL (CAST SUPPLIES) ×2
PEG LOCKING SMOOTH 2.2X20 (Screw) ×2 IMPLANT
PLATE NARROW DVR LEFT (Plate) ×1 IMPLANT
PUTTY DBM STAGRAFT 5CC (Putty) ×1 IMPLANT
SCREW LOCK 14X2.7X 3 LD TPR (Screw) IMPLANT
SCREW LOCK 16X2.7X 3 LD TPR (Screw) IMPLANT
SCREW LOCK 20X2.7X 3 LD TPR (Screw) IMPLANT
SCREW LOCKING 2.7X13MM (Screw) ×1 IMPLANT
SCREW LOCKING 2.7X14 (Screw) ×4 IMPLANT
SCREW LOCKING 2.7X15MM (Screw) ×1 IMPLANT
SCREW LOCKING 2.7X16 (Screw) ×4 IMPLANT
SCREW LOCKING 2.7X20MM (Screw) ×4 IMPLANT
SCREW MULTI DIRECTIONAL 2.7X20 (Screw) ×1 IMPLANT
SOAP 2 % CHG 4 OZ (WOUND CARE) ×2 IMPLANT
SPLINT FIBERGLASS 3X35 (CAST SUPPLIES) ×1 IMPLANT
SPONGE GAUZE 4X4 12PLY STER LF (GAUZE/BANDAGES/DRESSINGS) ×1 IMPLANT
SPONGE LAP 4X18 X RAY DECT (DISPOSABLE) ×2 IMPLANT
SUT ETHILON 4 0 PS 2 18 (SUTURE) IMPLANT
SUT MNCRL AB 4-0 PS2 18 (SUTURE) IMPLANT
SUT VIC AB 2-0 FS1 27 (SUTURE) ×1 IMPLANT
SUT VICRYL 4-0 PS2 18IN ABS (SUTURE) ×1 IMPLANT
SUT VICRYL RAPIDE 4/0 PS 2 (SUTURE) ×2 IMPLANT
SYR CONTROL 10ML LL (SYRINGE) IMPLANT
TOWEL OR 17X24 6PK STRL BLUE (TOWEL DISPOSABLE) ×2 IMPLANT
TOWEL OR 17X26 10 PK STRL BLUE (TOWEL DISPOSABLE) ×2 IMPLANT
TUBE CONNECTING 12X1/4 (SUCTIONS) ×2 IMPLANT
WATER STERILE IRR 1000ML POUR (IV SOLUTION) ×2 IMPLANT
YANKAUER SUCT BULB TIP NO VENT (SUCTIONS) IMPLANT

## 2016-01-21 NOTE — Discharge Instructions (Signed)
Transfer to [facility] - go to ED on arrival, contact Dr Caralyn Guile on arrival to [facility].

## 2016-01-21 NOTE — Brief Op Note (Signed)
01/21/2016  7:18 PM  PATIENT:  Kylie Christensen  75 y.o. female  PRE-OPERATIVE DIAGNOSIS:  distal radius fx  POST-OPERATIVE DIAGNOSIS:  * No post-op diagnosis entered *  PROCEDURE:  Procedure(s): OPEN REDUCTION INTERNAL FIXATION (ORIF) DISTAL RADIAL FRACTURE (Left)  SURGEON:  Surgeon(s) and Role:    * Iran Planas, MD - Primary  PHYSICIAN ASSISTANT:   ASSISTANTS: none   ANESTHESIA:   general  EBL:     BLOOD ADMINISTERED:none  DRAINS: none   LOCAL MEDICATIONS USED:  NONE  SPECIMEN:  No Specimen  DISPOSITION OF SPECIMEN:  N/A  COUNTS:  YES  TOURNIQUET:    DICTATION: .Other Dictation: Dictation Number [id number]  PLAN OF CARE: Discharge to home after PACU  PATIENT DISPOSITION:  PACU - hemodynamically stable.   Delay start of Pharmacological VTE agent (>24hrs) due to surgical blood loss or risk of bleeding: not applicable

## 2016-01-21 NOTE — H&P (Signed)
Kylie Christensen is an 75 y.o. female.   Chief Complaint: LEFT WRIST FRACTURE HPI: Patient is a 75 y.o. female presenting with arm injury. The history is provided by the patient.  Arm Injury Associated symptoms: no back pain, no fever and no neck pain  Patient c/o trip and fall today, injury an outstretched left wrist. Pt is right hand dominant. Notes remote hx left wrist fracture. Felt fine, at baseline, prior to fall. No faintness or dizziness. States tripped over her feet between narrow space at home. Denies other pain or injury. No head injury or headache. No neck or back pain. No numbness/weakness. Skin is intact. Pain persistent, mod-severe, worse w movement or palpation.  PT WITH H/O LEFT DISTAL RADIUS FRACTURE, TREATED CLOSED WITHOUT SURGERY IN PAST AND HEALED IN MALUNITED POSITION  Past Medical History  Diagnosis Date  . Hypothyroidism   . Anxiety   . GERD (gastroesophageal reflux disease)   . Headache   . Colitis     Past Surgical History  Procedure Laterality Date  . Back surgery  nov 2011    lower back  . Thyroid nodule removed  1970  . Abdominal hysterectomy      partial  . Tonsillectomy  age 63  . Biopsy breast Left yrs ago    benign  . Rotator cuffr Right   . Colonoscopy with propofol N/A 01/25/2015    Procedure: COLONOSCOPY WITH PROPOFOL;  Surgeon: Garlan Fair, MD;  Location: [facility];  Service: Endoscopy;  Laterality: N/A;  . Esophagogastroduodenoscopy (egd) with propofol N/A 01/25/2015    Procedure: ESOPHAGOGASTRODUODENOSCOPY (EGD) WITH PROPOFOL;  Surgeon: Garlan Fair, MD;  Location: [facility];  Service: Endoscopy;  Laterality: N/A;  . Balloon dilation N/A 01/25/2015    Procedure: BALLOON DILATION;  Surgeon: Garlan Fair, MD;  Location: [facility];  Service: Endoscopy;  Laterality: N/A;    No family history on file. Social History:  reports that she has never smoked. She has never used smokeless tobacco. She reports that she drinks alcohol.  She reports that she does not use illicit drugs.  Allergies:  Allergies  Allergen Reactions  . Amoxicillin Diarrhea and Rash    Medications Prior to Admission  Medication Sig Dispense Refill  . B Complex-C (B-COMPLEX WITH VITAMIN C) tablet Take 1 tablet by mouth daily.    . beta carotene w/minerals (OCUVITE) tablet Take 1 tablet by mouth daily.    . calcium carbonate (OS-CAL - DOSED IN MG OF ELEMENTAL CALCIUM) 1250 (500 Ca) MG tablet Take 1 tablet by mouth.    . cholecalciferol (VITAMIN D) 1000 units tablet Take 2,000 Units by mouth daily.    . cycloSPORINE (RESTASIS) 0.05 % ophthalmic emulsion Place 1 drop into both eyes 2 (two) times daily.     Marland Kitchen FLUoxetine (PROZAC) 20 MG capsule Take 20 mg by mouth daily.    Marland Kitchen levothyroxine (SYNTHROID, LEVOTHROID) 50 MCG tablet Take 25-50 mcg by mouth every other day. Alternates between 77mg and 50 mcg. Brand name only    . Multiple Vitamin (MULTIVITAMIN WITH MINERALS) TABS tablet Take 1 tablet by mouth daily.    .Marland Kitchenomega-3 acid ethyl esters (LOVAZA) 1 g capsule Take 2 g by mouth daily.    . Polyvinyl Alcohol-Povidone (REFRESH OP) Apply 1 drop to eye 3 (three) times daily as needed (Dry eyes).    . Skin Protectants, Misc. (EUCERIN) cream Apply 1 application topically daily as needed for dry skin.      Results for orders  placed or performed during the hospital encounter of 01/21/16 (from the past 48 hour(s))  CBC     Status: Abnormal   Collection Time: 01/21/16  4:00 PM  Result Value Ref Range   WBC 7.1 4.0 - 10.5 K/uL   RBC 5.00 3.87 - 5.11 MIL/uL   Hemoglobin 15.9 (H) 12.0 - 15.0 g/dL   HCT 48.9 (H) 36.0 - 46.0 %   MCV 97.8 78.0 - 100.0 fL   MCH 31.8 26.0 - 34.0 pg   MCHC 32.5 30.0 - 36.0 g/dL   RDW 14.1 11.5 - 15.5 %   Platelets 256 150 - 400 K/uL  Basic metabolic panel     Status: Abnormal   Collection Time: 01/21/16  4:00 PM  Result Value Ref Range   Sodium 137 135 - 145 mmol/L   Potassium 4.0 3.5 - 5.1 mmol/L   Chloride 101 101 - 111  mmol/L   CO2 22 22 - 32 mmol/L   Glucose, Bld 128 (H) 65 - 99 mg/dL   BUN 20 6 - 20 mg/dL   Creatinine, Ser 1.14 (H) 0.44 - 1.00 mg/dL   Calcium 9.3 8.9 - 10.3 mg/dL   GFR calc non Af Amer 46 (L) >60 mL/min   GFR calc Af Amer 53 (L) >60 mL/min    Comment: (NOTE) The eGFR has been calculated using the CKD EPI equation. This calculation has not been validated in all clinical situations. eGFR's persistently <60 mL/min signify possible Chronic Kidney Disease.    Anion gap 14 5 - 15   Dg Forearm Left  01/21/2016  CLINICAL DATA:  Fall onto outstretched arm EXAM: LEFT FOREARM - 2 VIEW COMPARISON:  Radiograph 01/21/2016 FINDINGS: Fracture of the distal radius and ulna. Dorsal displacement of the fracture fragments with dorsal angulation. Radiocarpal joint is intact. No elbow dislocation. IMPRESSION: Fractured distal radius and ulna with dorsal angulation and displacement. Electronically Signed   By: Suzy Bouchard M.D.   On: 01/21/2016 14:23   Dg Wrist Complete Left  01/21/2016  CLINICAL DATA:  Patient states she fell while cleaning and reached out to catch her fall. States she has previously broken her arm before and wrist. Best images obtainable. EXAM: LEFT WRIST - COMPLETE 3+ VIEW COMPARISON:  04/23/2015 FINDINGS: There are comminuted fractures of the distal radius and ulna, associated with dorsal angulation and 2/3 shaft with dorsal displacement at the fracture site. There is lucency in the region of the lunate, raising the question of a lunate fracture. The findings could be artifactual however. There is degenerative change at the first carpometacarpal joint. Significant diffuse soft tissue swelling. IMPRESSION: 1. Comminuted fractures of the distal radius and ulna associated with significant dorsal displacement and angulation. 2. Question of lunate fracture. Electronically Signed   By: Nolon Nations M.D.   On: 01/21/2016 14:24    ROS: NO RECENT ILLNESSES OR HOSPITALIZATIONS  Blood pressure  125/75, pulse 96, temperature 97.6 F (36.4 C), temperature source Oral, resp. rate 16, height '5\' 3"'$  (1.6 m), weight 80.65 kg (177 lb 12.8 oz), SpO2 97 %. Physical Exam  General Appearance:  Alert, cooperative, no distress, appears stated age  Head:  Normocephalic, without obvious abnormality, atraumatic  Eyes:  Pupils equal, conjunctiva/corneas clear,         Throat: Lips, mucosa, and tongue normal; teeth and gums normal  Neck: No visible masses     Lungs:   respirations unlabored  Chest Wall:  No tenderness or deformity  Heart:  Regular rate and  rhythm,  Abdomen:   Soft, non-tender,         Extremities: LUE: IN SUGAR TONG SPLNT FINGERS WARM WELL PERFUSED ABLE TO EXTEND THUMB AND FINGERS WIGGLES FINGERS  Pulses: 2+ and symmetric  Skin: Skin color, texture, turgor normal, no rashes or lesions     Neurologic: Normal   Assessment/Plan LEFT COMMINUTED DISTAL RADIUS FRACTURE, DISPLACED AND ANGULATED WITH H/O OF LEFT DISTAL RADIUS MALUNION  LEFT DISTAL RADIUS OPEN REDUCTION AND INTERNAL FIXATION/BONE GRAFTING AND REPAIR AS INDICATED  R/B/A DISCUSSED WITH PT IN HOSPITAL.  PT VOICED UNDERSTANDING OF PLAN CONSENT SIGNED DAY OF SURGERY PT SEEN AND EXAMINED PRIOR TO OPERATIVE PROCEDURE/DAY OF SURGERY SITE MARKED. QUESTIONS ANSWERED  WE ARE PLANNING SURGERY FOR YOUR UPPER EXTREMITY. THE RISKS AND BENEFITS OF SURGERY INCLUDE BUT NOT LIMITED TO BLEEDING INFECTION, DAMAGE TO NEARBY NERVES ARTERIES TENDONS, FAILURE OF SURGERY TO ACCOMPLISH ITS INTENDED GOALS, PERSISTENT SYMPTOMS AND NEED FOR FURTHER SURGICAL INTERVENTION. WITH THIS IN MIND WE WILL PROCEED. I HAVE DISCUSSED WITH THE PATIENT THE PRE AND POSTOPERATIVE REGIMEN AND THE DOS AND DON'TS. PT VOICED UNDERSTANDING AND INFORMED CONSENT SIGNED. WILL GO HOME FOLLOWING SURGERY Linna Hoff 01/21/2016, 5:59 PM

## 2016-01-21 NOTE — ED Notes (Signed)
Pt states she fell onto outstretched left arm today. Pt has deformity to left wrist. Pt denies head injury or loss of consciousness.

## 2016-01-21 NOTE — Transfer of Care (Signed)
Immediate Anesthesia Transfer of Care Note  Patient: Kylie Christensen  Procedure(s) Performed: Procedure(s): OPEN REDUCTION INTERNAL FIXATION (ORIF) DISTAL RADIAL FRACTURE (Left)  Patient Location: PACU  Anesthesia Type:GA combined with regional for post-op pain  Level of Consciousness: awake, alert  and oriented  Airway & Oxygen Therapy: Patient connected to nasal cannula oxygen  Post-op Assessment: Report given to RN and Post -op Vital signs reviewed and stable  Post vital signs: Reviewed and stable  Last Vitals:  Filed Vitals:   01/21/16 1654 01/21/16 1914  BP: 125/75 92/72  Pulse: 96 86  Temp: 36.4 C 36.4 C  Resp: 16 16    Complications: No apparent anesthesia complications

## 2016-01-21 NOTE — Anesthesia Procedure Notes (Addendum)
Anesthesia Regional Block:  Supraclavicular block  Pre-Anesthetic Checklist: ,, timeout performed, Correct Patient, Correct Site, Correct Laterality, Correct Procedure, Correct Position, site marked, Risks and benefits discussed,  Surgical consent,  Pre-op evaluation,  At surgeon's request and post-op pain management  Laterality: Left  Prep: chloraprep       Needles:  Injection technique: Single-shot  Needle Type: Echogenic Stimulator Needle     Needle Length: 5cm 5 cm Needle Gauge: 22 and 22 G    Additional Needles:  Procedures: ultrasound guided (picture in chart) and nerve stimulator Supraclavicular block  Nerve Stimulator or Paresthesia:  Response: biceps flexion, 0.45 mA,   Additional Responses:   Narrative:  Start time: 01/21/2016 5:24 PM End time: 01/21/2016 5:33 PM Injection made incrementally with aspirations every 5 mL.  Performed by: Personally  Anesthesiologist: HODIERNE, ADAM  Additional Notes: Functioning IV was confirmed and monitors were applied.  A 35mm 22ga Arrow echogenic stimulator needle was used. Sterile prep and drape,hand hygiene and sterile gloves were used.  Negative aspiration and negative test dose prior to incremental administration of local anesthetic. The patient tolerated the procedure well.  Ultrasound guidance: relevent anatomy identified, needle position confirmed, local anesthetic spread visualized around nerve(s), vascular puncture avoided.  Image printed for medical record.    Procedure Name: LMA Insertion Date/Time: 01/21/2016 5:54 PM Performed by: Marinda Elk A Pre-anesthesia Checklist: Patient identified, Timeout performed, Emergency Drugs available, Suction available and Patient being monitored Patient Re-evaluated:Patient Re-evaluated prior to inductionOxygen Delivery Method: Circle system utilized Preoxygenation: Pre-oxygenation with 100% oxygen Intubation Type: IV induction LMA: LMA inserted LMA Size: 4.0 Number of attempts:  1 Placement Confirmation: positive ETCO2 and breath sounds checked- equal and bilateral Tube secured with: Tape Dental Injury: Teeth and Oropharynx as per pre-operative assessment

## 2016-01-21 NOTE — Anesthesia Postprocedure Evaluation (Signed)
Anesthesia Post Note  Patient: Kylie Christensen  Procedure(s) Performed: Procedure(s) (LRB): OPEN REDUCTION INTERNAL FIXATION (ORIF) DISTAL RADIAL FRACTURE (Left)  Patient location during evaluation: PACU Anesthesia Type: General Level of consciousness: awake and alert and patient cooperative Pain management: pain level controlled Vital Signs Assessment: post-procedure vital signs reviewed and stable Respiratory status: spontaneous breathing and respiratory function stable Cardiovascular status: stable Anesthetic complications: no    Last Vitals:  Filed Vitals:   01/21/16 1945 01/21/16 2000  BP: 120/77 118/79  Pulse: 80 85  Temp:  37.2 C  Resp: 12 22    Last Pain:  Filed Vitals:   01/21/16 2012  PainSc: [place]

## 2016-01-21 NOTE — ED Notes (Signed)
Per Dr. Ashok Cordia, who notified [facility] Charge Darci Current, RN) pt. Is d/c'd. With her saline lock IV intact and family will transport to [facility].  She is d/c'd. At this time without incident.

## 2016-01-21 NOTE — Progress Notes (Signed)
Orthopedic Tech Progress Note Patient Details:  Kylie Christensen September 24, 1941 [id number]  Ortho Devices Type of Ortho Device: Arm sling Ortho Device/Splint Interventions: Ordered, Application   Braulio Bosch 01/21/2016, 7:54 PM

## 2016-01-21 NOTE — Brief Op Note (Signed)
01/21/2016  6:01 PM  PATIENT:  Valentino Hue  75 y.o. female  PRE-OPERATIVE DIAGNOSIS:  distal radius fx - left  POST-OPERATIVE DIAGNOSIS:  distal radius fx - left  PROCEDURE:  Procedure(s): OPEN REDUCTION INTERNAL FIXATION (ORIF) DISTAL RADIAL FRACTURE (Left)  SURGEON:  Surgeon(s) and Role:    * Iran Planas, MD - Primary  PHYSICIAN ASSISTANT:   ASSISTANTS: none   ANESTHESIA:   general  EBL:     BLOOD ADMINISTERED:none  DRAINS: none   LOCAL MEDICATIONS USED:  NONE  SPECIMEN:  No Specimen  DISPOSITION OF SPECIMEN:  N/A  COUNTS:  YES  TOURNIQUET:    DICTATION: .Other Dictation: Dictation Number [id number]  PLAN OF CARE: Discharge to home after PACU  PATIENT DISPOSITION:  PACU - hemodynamically stable.   Delay start of Pharmacological VTE agent (>24hrs) due to surgical blood loss or risk of bleeding: not applicable

## 2016-01-21 NOTE — ED Provider Notes (Signed)
CSN: [id number]     Arrival date & time 01/21/16  54 History   First MD Initiated Contact with Patient 01/21/16 1343     Chief Complaint  Patient presents with  . Arm Injury     (Consider location/radiation/quality/duration/timing/severity/associated sxs/prior Treatment) Patient is a 75 y.o. female presenting with arm injury. The history is provided by the patient.  Arm Injury Associated symptoms: no back pain, no fever and no neck pain   Patient c/o trip and fall today, injury an outstretched left wrist. Pt is right hand dominant. Notes remote hx left wrist fracture.  Felt fine, at baseline, prior to fall. No faintness or dizziness. States tripped over her feet between narrow space at home.  Denies other pain or injury. No head injury or headache. No neck or back pain. No numbness/weakness. Skin is intact. Pain persistent, mod-severe, worse w movement or palpation.       Past Medical History  Diagnosis Date  . Hypothyroidism   . Anxiety   . GERD (gastroesophageal reflux disease)   . Headache   . Colitis    Past Surgical History  Procedure Laterality Date  . Back surgery  nov 2011    lower back  . Thyroid nodule removed  1970  . Abdominal hysterectomy      partial  . Tonsillectomy  age 3  . Biopsy breast Left yrs ago    benign  . Rotator cuffr Right   . Colonoscopy with propofol N/A 01/25/2015    Procedure: COLONOSCOPY WITH PROPOFOL;  Surgeon: Garlan Fair, MD;  Location: [facility];  Service: Endoscopy;  Laterality: N/A;  . Esophagogastroduodenoscopy (egd) with propofol N/A 01/25/2015    Procedure: ESOPHAGOGASTRODUODENOSCOPY (EGD) WITH PROPOFOL;  Surgeon: Garlan Fair, MD;  Location: [facility];  Service: Endoscopy;  Laterality: N/A;  . Balloon dilation N/A 01/25/2015    Procedure: BALLOON DILATION;  Surgeon: Garlan Fair, MD;  Location: [facility];  Service: Endoscopy;  Laterality: N/A;   No family history on file. Social History  Substance Use Topics   . Smoking status: Never Smoker   . Smokeless tobacco: Never Used  . Alcohol Use: Yes     Comment: occasional   OB History    No data available     Review of Systems  Constitutional: Negative for fever.  Gastrointestinal: Negative for nausea and vomiting.  Musculoskeletal: Negative for back pain and neck pain.  Skin: Negative for wound.  Neurological: Negative for weakness, numbness and headaches.      Allergies  Amoxicillin  Home Medications   Prior to Admission medications   Medication Sig Start Date End Date Taking? Authorizing Provider  B Complex-C (B-COMPLEX WITH VITAMIN C) tablet Take 1 tablet by mouth daily.   Yes Historical Provider, MD  beta carotene w/minerals (OCUVITE) tablet Take 1 tablet by mouth daily.   Yes Historical Provider, MD  calcium carbonate (OS-CAL - DOSED IN MG OF ELEMENTAL CALCIUM) 1250 (500 Ca) MG tablet Take 1 tablet by mouth.   Yes Historical Provider, MD  cholecalciferol (VITAMIN D) 1000 units tablet Take 2,000 Units by mouth daily.   Yes Historical Provider, MD  cycloSPORINE (RESTASIS) 0.05 % ophthalmic emulsion Place 1 drop into both eyes 2 (two) times daily.    Yes Historical Provider, MD  FLUoxetine (PROZAC) 20 MG capsule Take 20 mg by mouth daily.   Yes Historical Provider, MD  levothyroxine (SYNTHROID, LEVOTHROID) 50 MCG tablet Take 25-50 mcg by mouth every other day. Alternates between 32mcg and  50 mcg. Brand name only   Yes Historical Provider, MD  Multiple Vitamin (MULTIVITAMIN WITH MINERALS) TABS tablet Take 1 tablet by mouth daily.   Yes Historical Provider, MD  omega-3 acid ethyl esters (LOVAZA) 1 g capsule Take 2 g by mouth daily.   Yes Historical Provider, MD  Polyvinyl Alcohol-Povidone (REFRESH OP) Apply 1 drop to eye 3 (three) times daily as needed (Dry eyes).   Yes Historical Provider, MD  Skin Protectants, Misc. (EUCERIN) cream Apply 1 application topically daily as needed for dry skin.   Yes Historical Provider, MD   BP 128/88  mmHg  Pulse 79  Temp(Src) 97.4 F (36.3 C) (Oral)  SpO2 100% Physical Exam  Constitutional: She appears well-developed and well-nourished. No distress.  HENT:  Head: Atraumatic.  Eyes: Conjunctivae are normal. No scleral icterus.  Neck: Neck supple. No tracheal deviation present.  Cardiovascular: Normal rate and intact distal pulses.   Pulmonary/Chest: Effort normal. No respiratory distress. She exhibits no tenderness.  Abdominal: Normal appearance. She exhibits no distension. There is no tenderness.  Musculoskeletal: She exhibits no edema.  CTLS spine, non tender, aligned, no step off.  Deformity to left wrist, radial pulse 2+. Skin is intact. Mod sts.  No other focal bony tenderness on bil ext exam.   Neurological: She is alert.  Left hand, r/m/u nerve fxn intact.   Skin: Skin is warm and dry. No rash noted. She is not diaphoretic.  Psychiatric: She has a normal mood and affect.  Nursing note and vitals reviewed.   ED Course  Procedures (including critical care time) Labs Review Labs Reviewed  CBC  BASIC METABOLIC PANEL    Imaging Review Dg Forearm Left  01/21/2016  CLINICAL DATA:  Fall onto outstretched arm EXAM: LEFT FOREARM - 2 VIEW COMPARISON:  Radiograph 01/21/2016 FINDINGS: Fracture of the distal radius and ulna. Dorsal displacement of the fracture fragments with dorsal angulation. Radiocarpal joint is intact. No elbow dislocation. IMPRESSION: Fractured distal radius and ulna with dorsal angulation and displacement. Electronically Signed   By: Suzy Bouchard M.D.   On: 01/21/2016 14:23   Dg Wrist Complete Left  01/21/2016  CLINICAL DATA:  Patient states she fell while cleaning and reached out to catch her fall. States she has previously broken her arm before and wrist. Best images obtainable. EXAM: LEFT WRIST - COMPLETE 3+ VIEW COMPARISON:  04/23/2015 FINDINGS: There are comminuted fractures of the distal radius and ulna, associated with dorsal angulation and 2/3 shaft  with dorsal displacement at the fracture site. There is lucency in the region of the lunate, raising the question of a lunate fracture. The findings could be artifactual however. There is degenerative change at the first carpometacarpal joint. Significant diffuse soft tissue swelling. IMPRESSION: 1. Comminuted fractures of the distal radius and ulna associated with significant dorsal displacement and angulation. 2. Question of lunate fracture. Electronically Signed   By: Nolon Nations M.D.   On: 01/21/2016 14:24   I have personally reviewed and evaluated these images as part of my medical decision-making.   MDM   Iv ns. Dilaudid .5 mg iv. zofran iv.  Elevate. Ice.  Pain improved, declines additional pain med currently.   Discussed pt with ortho/hand on call, Dr Caralyn Guile, who requests labs, splint, and transfer to [facility] - he is in OR there, and indicates he will add her on as next case.  Pt agreeable w plan. Will keep npo.      Lajean Saver, MD 01/21/16 [phone]

## 2016-01-21 NOTE — Anesthesia Preprocedure Evaluation (Addendum)
Anesthesia Evaluation  Patient identified by MRN, date of birth, ID band Patient awake    Reviewed: Allergy & Precautions, NPO status , Patient's Chart, lab work & pertinent test results  Airway Mallampati: II  TM Distance: >3 FB Neck ROM: Full    Dental  (+) Teeth Intact, Dental Advisory Given   Pulmonary neg pulmonary ROS,    breath sounds clear to auscultation       Cardiovascular negative cardio ROS   Rhythm:regular Rate:Normal     Neuro/Psych  Headaches, PSYCHIATRIC DISORDERS Anxiety    GI/Hepatic GERD  ,  Endo/Other  Hypothyroidism obese  Renal/GU      Musculoskeletal   Abdominal   Peds  Hematology   Anesthesia Other Findings   Reproductive/Obstetrics                            Anesthesia Physical Anesthesia Plan  ASA: II  Anesthesia Plan: General and Regional   Post-op Pain Management:    Induction: Intravenous  Airway Management Planned: LMA  Additional Equipment:   Intra-op Plan:   Post-operative Plan:   Informed Consent: I have reviewed the patients History and Physical, chart, labs and discussed the procedure including the risks, benefits and alternatives for the proposed anesthesia with the patient or authorized representative who has indicated his/her understanding and acceptance.     Plan Discussed with: CRNA, Anesthesiologist and Surgeon  Anesthesia Plan Comments:         Anesthesia Quick Evaluation

## 2016-01-22 NOTE — Discharge Summary (Signed)
Physician Discharge Summary  Patient ID: Kylie Christensen MRN: [id number] DOB/AGE: 1941-10-10 75 y.o.  Admit date: 01/21/2016 Discharge date: 01/22/2016  Admission Diagnoses: distal radius fx - left Past Medical History  Diagnosis Date  . Hypothyroidism   . Anxiety   . GERD (gastroesophageal reflux disease)   . Headache   . Colitis     Discharge Diagnoses:  Active Problems:   Distal radius fracture, left   Surgeries: Procedure(s): OPEN REDUCTION INTERNAL FIXATION (ORIF) DISTAL RADIAL FRACTURE on 01/21/2016    Consultants:  none  Discharged Condition: Improved  Hospital Course: Kylie Christensen is an 75 y.o. female who was admitted 01/21/2016 with a chief complaint of No chief complaint on file. , and found to have a diagnosis of distal radius fx - left.  They were brought to the operating room on 01/21/2016 and underwent Procedure(s): OPEN REDUCTION INTERNAL FIXATION (ORIF) DISTAL RADIAL FRACTURE.    They were given perioperative antibiotics: Anti-infectives    Start     Dose/Rate Route Frequency Ordered Stop   01/21/16 2345  ceFAZolin (ANCEF) IVPB 1 g/50 mL premix  Status:  Discontinued     1 g 100 mL/hr over 30 Minutes Intravenous NOW 01/21/16 2340 01/21/16 2342   01/21/16 2345  ceFAZolin (ANCEF) IVPB 1 g/50 mL premix     1 g 100 mL/hr over 30 Minutes Intravenous 3 times per day 01/21/16 2340     01/21/16 1739  vancomycin (VANCOCIN) 1 GM/200ML IVPB    Comments:  Marinda Elk   : cabinet override      01/21/16 1739 01/22/16 0544    .  They were given sequential compression devices, early ambulation, and Other (comment)ambulation for DVT prophylaxis.  Recent vital signs: Patient Vitals for the past 24 hrs:  BP Temp Temp src Pulse Resp SpO2 Height Weight  01/22/16 1201 (!) 104/57 mmHg 97.5 F (36.4 C) Oral 75 16 97 % - -  01/22/16 0548 117/69 mmHg 97.6 F (36.4 C) Oral 63 16 99 % - -  01/22/16 0053 115/70 mmHg 97.6 F (36.4 C) Oral 81 16 94 % - -  01/21/16 2037 122/70  mmHg 97.7 F (36.5 C) Oral 83 16 95 % - -  01/21/16 2015 114/75 mmHg - - 80 15 94 % - -  01/21/16 2000 118/79 mmHg 98.9 F (37.2 C) - 85 (!) 22 95 % - -  01/21/16 1945 120/77 mmHg - - 80 12 98 % - -  01/21/16 1930 117/69 mmHg - - 81 16 96 % - -  01/21/16 1915 114/63 mmHg - - 86 20 96 % - -  01/21/16 1914 92/72 mmHg 97.6 F (36.4 C) - 86 16 96 % - -  01/21/16 1654 125/75 mmHg 97.6 F (36.4 C) Oral 96 16 97 % 5\' 3"  (1.6 m) 80.65 kg (177 lb 12.8 oz)  .  Recent laboratory studies: Dg Forearm Left  01/21/2016  CLINICAL DATA:  Fall onto outstretched arm EXAM: LEFT FOREARM - 2 VIEW COMPARISON:  Radiograph 01/21/2016 FINDINGS: Fracture of the distal radius and ulna. Dorsal displacement of the fracture fragments with dorsal angulation. Radiocarpal joint is intact. No elbow dislocation. IMPRESSION: Fractured distal radius and ulna with dorsal angulation and displacement. Electronically Signed   By: Suzy Bouchard M.D.   On: 01/21/2016 14:23   Dg Wrist Complete Left  01/21/2016  CLINICAL DATA:  Patient states she fell while cleaning and reached out to catch her fall. States she has previously broken her arm before  and wrist. Best images obtainable. EXAM: LEFT WRIST - COMPLETE 3+ VIEW COMPARISON:  04/23/2015 FINDINGS: There are comminuted fractures of the distal radius and ulna, associated with dorsal angulation and 2/3 shaft with dorsal displacement at the fracture site. There is lucency in the region of the lunate, raising the question of a lunate fracture. The findings could be artifactual however. There is degenerative change at the first carpometacarpal joint. Significant diffuse soft tissue swelling. IMPRESSION: 1. Comminuted fractures of the distal radius and ulna associated with significant dorsal displacement and angulation. 2. Question of lunate fracture. Electronically Signed   By: Nolon Nations M.D.   On: 01/21/2016 14:24    Discharge Medications:     Medication List    ASK your doctor  about these medications        B-complex with vitamin C tablet  Take 1 tablet by mouth daily.     beta carotene w/minerals tablet  Take 1 tablet by mouth daily.     calcium carbonate 1250 (500 Ca) MG tablet  Commonly known as:  OS-CAL - dosed in mg of elemental calcium  Take 1 tablet by mouth.     cholecalciferol 1000 units tablet  Commonly known as:  VITAMIN D  Take 2,000 Units by mouth daily.     cycloSPORINE 0.05 % ophthalmic emulsion  Commonly known as:  RESTASIS  Place 1 drop into both eyes 2 (two) times daily.     docusate sodium 100 MG capsule  Commonly known as:  COLACE  Take 1 capsule (100 mg total) by mouth 2 (two) times daily.     eucerin cream  Apply 1 application topically daily as needed for dry skin.     FLUoxetine 20 MG capsule  Commonly known as:  PROZAC  Take 20 mg by mouth daily.     levothyroxine 50 MCG tablet  Commonly known as:  SYNTHROID, LEVOTHROID  Take 25-50 mcg by mouth every other day. Alternates between 41mcg and 50 mcg. Brand name only     methocarbamol 500 MG tablet  Commonly known as:  ROBAXIN  Take 1 tablet (500 mg total) by mouth 4 (four) times daily.     multivitamin with minerals Tabs tablet  Take 1 tablet by mouth daily.     omega-3 acid ethyl esters 1 g capsule  Commonly known as:  LOVAZA  Take 2 g by mouth daily.     oxyCODONE-acetaminophen 5-325 MG tablet  Commonly known as:  ROXICET  Take 1 tablet by mouth every 4 (four) hours as needed for severe pain.     REFRESH OP  Apply 1 drop to eye 3 (three) times daily as needed (Dry eyes).     vitamin C 500 MG tablet  Commonly known as:  ASCORBIC ACID  Take 1 tablet (500 mg total) by mouth daily.        Diagnostic Studies: Dg Forearm Left  01/21/2016  CLINICAL DATA:  Fall onto outstretched arm EXAM: LEFT FOREARM - 2 VIEW COMPARISON:  Radiograph 01/21/2016 FINDINGS: Fracture of the distal radius and ulna. Dorsal displacement of the fracture fragments with dorsal angulation.  Radiocarpal joint is intact. No elbow dislocation. IMPRESSION: Fractured distal radius and ulna with dorsal angulation and displacement. Electronically Signed   By: Suzy Bouchard M.D.   On: 01/21/2016 14:23   Dg Wrist Complete Left  01/21/2016  CLINICAL DATA:  Patient states she fell while cleaning and reached out to catch her fall. States she has previously broken her arm  before and wrist. Best images obtainable. EXAM: LEFT WRIST - COMPLETE 3+ VIEW COMPARISON:  04/23/2015 FINDINGS: There are comminuted fractures of the distal radius and ulna, associated with dorsal angulation and 2/3 shaft with dorsal displacement at the fracture site. There is lucency in the region of the lunate, raising the question of a lunate fracture. The findings could be artifactual however. There is degenerative change at the first carpometacarpal joint. Significant diffuse soft tissue swelling. IMPRESSION: 1. Comminuted fractures of the distal radius and ulna associated with significant dorsal displacement and angulation. 2. Question of lunate fracture. Electronically Signed   By: Nolon Nations M.D.   On: 01/21/2016 14:24    They benefited maximally from their hospital stay and there were no complications.     Disposition: 01-Home or Self Care     Signed: Linna Hoff 01/22/2016, 4:25 PM

## 2016-01-22 NOTE — Evaluation (Addendum)
Occupational Therapy Evaluation Patient Details Name: Kylie Christensen MRN: [id number] DOB: 12-24-1940 Today's Date: 01/22/2016    History of Present Illness 75 y.o. s/p OPEN REDUCTION INTERNAL FIXATION (ORIF) DISTAL RADIAL FRACTURE (Left). PMH includes previous left wrist injury, anxiety, GERD, hypothyroidism, headache, colitis, right rotator cuff surgery, back surgery.   Clinical Impression   Pt s/p above. Education provided in session and OT signing off. Pt will have spouse and daughter to assist.    Follow Up Recommendations  No OT follow up;Supervision - Intermittent    Equipment Recommendations  None recommended by OT    Recommendations for Other Services       Precautions / Restrictions Precautions Precaution Comments: no pushing, pulling, lifting with LUE (can use to hold light items); reviewed precautions with pt Required Braces or Orthoses: Sling Restrictions Weight Bearing Restrictions: Yes LUE Weight Bearing: Non weight bearing      Mobility Bed Mobility Overal bed mobility: Modified Independent             General bed mobility comments: performed supine to sit and used rail.  Transfers Overall transfer level: Modified independent                    Balance      When ambulating, pt unable to walk quickly while looking around. Pt reports her balance feels off. Unsteady with single leg stance when simulating functional activity, but did better with another trial.                                      ADL Overall ADL's : Needs assistance/impaired Eating/Feeding: Set up;Sitting           Lower Body Bathing: Min guard (standing) Lower Body Bathing Details (indicate cue type and reason): pt attempted again and did better-supervision.     Lower Body Dressing: Minimal assistance;Sit to/from stand   Toilet Transfer: Supervision/safety;Ambulation (sit to stand from bed-Mod I)       Tub/ Shower Transfer: Tub  transfer;Supervision/safety;Set up;Ambulation   Functional mobility during ADLs: Supervision/safety General ADL Comments: Educated on safety such as safe footwear, rugs/items on floor, recommended daughter be with her for tub transfer and be near for bathing. Talked about wrapping LUE in plastic or using bag if MD says okay to shower. Educated on tub transfer techniques. Educated on UB dressing technique and clothing that may be easier to manage. Educated on sling. Educated on edema management techniques. Elevated LUE on pillows.     Vision     Perception     Praxis      Pertinent Vitals/Pain Pain Assessment: 0-10 Pain Score:  (1-2 ) Pain Location: head Pain Descriptors / Indicators: Headache;Nagging Pain Intervention(s): Monitored during session     Hand Dominance Right   Extremity/Trunk Assessment Upper Extremity Assessment Upper Extremity Assessment: LUE deficits/detail LUE Deficits / Details: pt's LUE numb due to block LUE: Unable to fully assess due to immobilization LUE Sensation: decreased light touch LUE Coordination: decreased fine motor;decreased gross motor   Lower Extremity Assessment Lower Extremity Assessment: Defer to PT evaluation       Communication Communication Communication: No difficulties   Cognition Arousal/Alertness: Awake/alert Behavior During Therapy: WFL for tasks assessed/performed Overall Cognitive Status: Within Functional Limits for tasks assessed                     General Comments  Exercises Exercises: Other exercises Other Exercises Other Exercises: educated on Left shoulder flexion and OT demonstrated on pt and explained reasoning. Educated on digit movement and retrograde massage and OT demonstrated on pt.    Shoulder Instructions      Home Living Family/patient expects to be discharged to:: Private residence Living Arrangements: Spouse/significant other Available Help at Discharge: Family;Available 24 hours/day  (daughter-intermittent assist; spouse home-see comments below) Type of Home: House Home Access: Stairs to enter Entrance Stairs-Number of Steps: 4 Entrance Stairs-Rails: Right Home Layout: One level     Bathroom Shower/Tub: Teacher, early years/pre:  (elevated toilet)         Additional Comments: spouse home with pt but has falled recently and has episodes at home where he loses eye sight and one side goes weak.      Prior Functioning/Environment Level of Independence: Independent             OT Diagnosis: Acute pain   OT Problem List: Decreased strength;Impaired balance (sitting and/or standing);Decreased range of motion;Impaired UE functional use;Pain;Increased edema;Impaired sensation;Decreased coordination   OT Treatment/Interventions:      OT Goals(Current goals can be found in the care plan section)    OT Frequency:     Barriers to D/C:            Co-evaluation              End of Session Equipment Utilized During Treatment: Gait belt;Other (comment) (sling)  Activity Tolerance: Patient tolerated treatment well Patient left: in chair;with call bell/phone within reach   Time: 0917-0953 OT Time Calculation (min): 36 min Charges:  OT General Charges $OT Visit: 1 Procedure OT Evaluation $OT Eval Moderate Complexity: 1 Procedure OT Treatments $Self Care/Home Management : 8-22 mins G-Codes: OT G-codes **NOT FOR INPATIENT CLASS** Functional Assessment Tool Used: clinical judgment Functional Limitation: Self care Self Care Current Status [id number]): At least 20 percent but less than 40 percent impaired, limited or restricted Self Care Goal Status [id number]): At least 20 percent but less than 40 percent impaired, limited or restricted Self Care Discharge Status [phone]): At least 20 percent but less than 40 percent impaired, limited or restricted  Benito Mccreedy OTR/L [id number] 01/22/2016, 11:10 AM

## 2016-01-22 NOTE — Evaluation (Signed)
Physical Therapy Evaluation Patient Details Name: Kylie Christensen MRN: [id number] DOB: 21-Aug-1941 Today's Date: 01/22/2016   History of Present Illness  75 y.o. s/p OPEN REDUCTION INTERNAL FIXATION (ORIF) DISTAL RADIAL FRACTURE (Left). PMH includes previous left wrist injury, anxiety, GERD, hypothyroidism, headache, colitis, right rotator cuff surgery, back surgery.  Clinical Impression  Patient is functioning at supervision level with mobility and gait.  Slightly unsteady gait at times - patient feels due to pain meds.  Encouraged use of cane or crutch at home for stability/safety.  No further PT needs identified - PT will sign off.    Follow Up Recommendations Supervision for mobility/OOB;No PT follow up (Pt will contact MD if balance remains issue off pain meds)    Equipment Recommendations  None recommended by PT    Recommendations for Other Services       Precautions / Restrictions Precautions Precautions: Fall Precaution Comments: no pushing, pulling, lifting with LUE (can use to hold light items); reviewed precautions with pt Required Braces or Orthoses: Sling Restrictions Weight Bearing Restrictions: Yes LUE Weight Bearing: Non weight bearing      Mobility  Bed Mobility Overal bed mobility: Modified Independent             General bed mobility comments: performed supine to sit and used rail.  Transfers Overall transfer level: Modified independent Equipment used: None             General transfer comment: Good static standing balance  Ambulation/Gait Ambulation/Gait assistance: Supervision Ambulation Distance (Feet): 200 Feet Assistive device: None;Straight cane Gait Pattern/deviations: Step-through pattern;Decreased step length - right;Decreased step length - left;Decreased stride length;Shuffle;Staggering left;Drifts right/left;Wide base of support Gait velocity: decreased Gait velocity interpretation: Below normal speed for age/gender General Gait  Details: Initially ambulating with no assistive device.  Patient with slightly unsteady gait, drifting to left and staggering to left x2.  Provided patient with cane.  Noted improvement in balance.  Gait remained slow, but more steady.  Encouraged patient to use her cane or her crutch at home for balance.  Stairs            Wheelchair Mobility    Modified Rankin (Stroke Patients Only)       Balance Overall balance assessment: Needs assistance         Standing balance support: No upper extremity supported Standing balance-Leahy Scale: Good Standing balance comment: Staggering to Lt noted with no assistive device during gait.             High level balance activites: Direction changes;Turns;Sudden stops;Head turns High Level Balance Comments: Imbalance noted with high level balance activities.             Pertinent Vitals/Pain Pain Assessment: No/denies pain (Reports nerve block has not worn off) Pain Score:  (1-2 ) Pain Location: head Pain Descriptors / Indicators: Headache;Nagging Pain Intervention(s): Monitored during session    Home Living Family/patient expects to be discharged to:: Private residence Living Arrangements: Spouse/significant other Available Help at Discharge: Family;Available 24 hours/day (Husband 63*; daughter prn) Type of Home: House Home Access: Stairs to enter Entrance Stairs-Rails: Right Entrance Stairs-Number of Steps: 4 Home Layout: One level Home Equipment: Cane - single point;Crutches Additional Comments: spouse home with pt but has falled recently and has episodes at home where he loses eye sight and one side goes weak.    Prior Function Level of Independence: Independent               Hand Dominance  Dominant Hand: Right    Extremity/Trunk Assessment   Upper Extremity Assessment: Defer to OT evaluation       LUE Deficits / Details: pt's LUE numb due to block   Lower Extremity Assessment: Overall WFL for  tasks assessed         Communication   Communication: No difficulties  Cognition Arousal/Alertness: Awake/alert Behavior During Therapy: WFL for tasks assessed/performed Overall Cognitive Status: Within Functional Limits for tasks assessed                      General Comments      Exercises Other Exercises Other Exercises: educated on Left shoulder flexion and OT demonstrated on pt and explained reasoning. Educated on digit movement and retrograde massage and OT demonstrated.       Assessment/Plan    PT Assessment Patent does not need any further PT services  PT Diagnosis Abnormality of gait   PT Problem List    PT Treatment Interventions     PT Goals (Current goals can be found in the Care Plan section) Acute Rehab PT Goals PT Goal Formulation: All assessment and education complete, DC therapy    Frequency     Barriers to discharge        Co-evaluation               End of Session Equipment Utilized During Treatment: Gait belt Activity Tolerance: Patient tolerated treatment well Patient left: in bed;with call bell/phone within reach Nurse Communication: Mobility status    Functional Assessment Tool Used: Clinical judgement Functional Limitation: Mobility: Walking and moving around Mobility: Walking and Moving Around Current Status [id number]): At least 1 percent but less than 20 percent impaired, limited or restricted Mobility: Walking and Moving Around Goal Status [phone]): At least 1 percent but less than 20 percent impaired, limited or restricted Mobility: Walking and Moving Around Discharge Status [phone]): At least 1 percent but less than 20 percent impaired, limited or restricted    Time: 1216-1234 PT Time Calculation (min) (ACUTE ONLY): 18 min   Charges:   PT Evaluation $PT Eval Low Complexity: 1 Procedure     PT G Codes:   PT G-Codes **NOT FOR INPATIENT CLASS** Functional Assessment Tool Used: Clinical judgement Functional Limitation:  Mobility: Walking and moving around Mobility: Walking and Moving Around Current Status [id number]): At least 1 percent but less than 20 percent impaired, limited or restricted Mobility: Walking and Moving Around Goal Status [phone]): At least 1 percent but less than 20 percent impaired, limited or restricted Mobility: Walking and Moving Around Discharge Status [phone]): At least 1 percent but less than 20 percent impaired, limited or restricted    Despina Pole 01/22/2016, 1:20 PM Carita Pian. Sanjuana Kava, [place] Pager [phone]

## 2016-01-22 NOTE — Discharge Instructions (Signed)
KEEP BANDAGE CLEAN AND DRY CALL OFFICE FOR F/U APPT [phone] in 2 weeks Dr Caralyn Guile cell phone [phone] KEEP HAND ELEVATED ABOVE HEART OK TO APPLY ICE TO OPERATIVE AREA CONTACT OFFICE IF ANY WORSENING PAIN OR CONCERNS.

## 2016-01-22 NOTE — Op Note (Signed)
NAMEJENET, Kylie Christensen NO.:  [id number]  MEDICAL RECORD NO.:  [id number]  LOCATION:  [facility]                         FACILITY:  [facility]  PHYSICIAN:  Linna Hoff IV, M.D.DATE OF BIRTH:  Mar 07, 1941  DATE OF PROCEDURE:  01/21/2016 DATE OF DISCHARGE:                              OPERATIVE REPORT   PREOPERATIVE DIAGNOSIS:  Left wrist comminuted intra-articular distal radius fracture 3 or more fragments.  POSTOPERATIVE DIAGNOSIS:  Left wrist comminuted intra-articular distal radius fracture 3 or more fragments.  ATTENDING PHYSICIAN:  Linna Hoff, MD, who scrubbed for the entire procedure.  ASSISTANT SURGEON:  None.  ANESTHESIA:  General via LMA, supraclavicular block.  SURGICAL PROCEDURE: 1. Open treatment of left wrist intra-articular distal radius     fracture, 3 or more fragments. 2. Left wrist brachioradialis tendon release and lengthening. 3. Radiographs 3 views, left wrist.  SURGICAL IMPLANTS:  DVR Crosslock epineural plate with several mL of Biomet SteriGraft bone graft substitute.  RADIOGRAPHIC INTERPRETATION:  AP and lateral views of the wrist did show the volar plate fixation in place.  The patient had the ulnar malunion and there was good restoration from the previous, overall alignment following fixation.  SURGICAL INDICATIONS:  Kylie Christensen is right hand dominant female, who previously had a distal radius malunion.  Patient fell again and re- broke her wrist.  The patient sustained a closed distal radius fracture. The patient was seen and evaluated in the office in the ER, and recommended to undergo the above procedure.  Risks, benefits, and alternatives were discussed in detail with the patient.  Signed informed consent was obtained.  Risks include, but not limited to bleeding, infection, damage to nearby nerves, arteries, or tendons; nonunion; malunion; hardware failure; and need for further surgical intervention.  DESCRIPTION OF PROCEDURE:   The patient was properly identified in the preoperative holding area, marked with a permanent marker on left wrist to indicate the correct operative site.  The patient was brought back to the operating room, placed supine on the anesthesia table.  General anesthesia was administered.  The patient tolerated this well.  A well- padded tourniquet was placed on left brachium, sealed with 1000 drape. Left upper extremity was then prepped and draped in normal sterile fashion.  Time-out was called, correct side was identified and procedure was begun.  Attention was then turned to the left wrist.  Limb was then elevated using Esmarch exsanguination and tourniquet insufflated. Longitudinal incision made directly over the FCR sheath.  Dissection was carried down through the skin and subcutaneous tissue.  The FCR sheath was then opened up proximally and distally.  There really was not much in the pronator quadratus left to elevate, given the displacement of the fracture.  Whatever remainder of the pronator quadratus was then elevated and the open reduction was then performed.  This was an intra- articular fracture 3 or more fragments.  Once this was carried out, the brachioradialis was then carefully released off the radial styloid, reducing the radial column.  This was necessary for open reduction and to get the fracture back out to the length.  The wound was then thoroughly irrigated.  After copious wound irrigation, metaphyseal defect  was then packed with several mL of bone graft substitute.  An open reduction was then performed of the 3 or more fragments and the volar plate was then applied.  The plate was bent in order to contour the plate to the bone based on the malunion.  This was then held in place with the K-wire distally.  Nonlocked screw hole was then placed proximally.  Following this, the distal locking pegs and screws were then placed distally and screws were then placed proximally  with appropriate 2.2 mm drill bit and 2.7 mm screws.  The wound was then irrigated.  Final radiographs were then obtained.  The pronator quadratus was then closed with 2-0 Vicryl, subcutaneous tissue was closed with 4-0 Vicryl, skin closed with 4-0 Vicryl Rapide.  Adaptic dressing and sterile compressive bandage were then applied.  The patient was then placed in well-padded sugar-tong splint, extubated, and taken to recovery room in good condition.  POSTPROCEDURE PLAN:  The patient admitted overnight for IV antibiotics and pain control, discharge in the morning, will see her back in the office in approximately 2 weeks for wound check, application of short- arm cast, x-rays out of the splint.  Begin a therapy regimen with a 4 week mark.  X-rays at each visit presenting to therapy ordered 1st postoperative visit.     Kylie Christensen, M.D.     FWO/MEDQ  D:  01/21/2016  T:  01/21/2016  Job:  [id number]

## 2016-01-22 NOTE — Progress Notes (Signed)
Discharge instructions and prescription provided to the patient.  No questions at time of discharge.  Patient discharged to home with husband.  Escorted via wheelchair by RN.

## 2016-01-24 ENCOUNTER — Encounter (HOSPITAL_COMMUNITY): Payer: Self-pay | Admitting: Orthopedic Surgery

## 2016-02-07 DIAGNOSIS — S52562D Barton's fracture of left radius, subsequent encounter for closed fracture with routine healing: Secondary | ICD-10-CM | POA: Diagnosis not present

## 2016-02-07 DIAGNOSIS — Z4789 Encounter for other orthopedic aftercare: Secondary | ICD-10-CM | POA: Diagnosis not present

## 2016-02-20 DIAGNOSIS — N6459 Other signs and symptoms in breast: Secondary | ICD-10-CM | POA: Diagnosis not present

## 2016-02-21 DIAGNOSIS — S52562A Barton's fracture of left radius, initial encounter for closed fracture: Secondary | ICD-10-CM | POA: Diagnosis not present

## 2016-02-21 DIAGNOSIS — S52562D Barton's fracture of left radius, subsequent encounter for closed fracture with routine healing: Secondary | ICD-10-CM | POA: Diagnosis not present

## 2016-02-24 DIAGNOSIS — D485 Neoplasm of uncertain behavior of skin: Secondary | ICD-10-CM | POA: Diagnosis not present

## 2016-02-24 DIAGNOSIS — D225 Melanocytic nevi of trunk: Secondary | ICD-10-CM | POA: Diagnosis not present

## 2016-02-24 DIAGNOSIS — L820 Inflamed seborrheic keratosis: Secondary | ICD-10-CM | POA: Diagnosis not present

## 2016-02-24 DIAGNOSIS — L259 Unspecified contact dermatitis, unspecified cause: Secondary | ICD-10-CM | POA: Diagnosis not present

## 2016-03-01 DIAGNOSIS — S52562D Barton's fracture of left radius, subsequent encounter for closed fracture with routine healing: Secondary | ICD-10-CM | POA: Diagnosis not present

## 2016-03-07 DIAGNOSIS — L519 Erythema multiforme, unspecified: Secondary | ICD-10-CM | POA: Diagnosis not present

## 2016-03-08 DIAGNOSIS — S52562D Barton's fracture of left radius, subsequent encounter for closed fracture with routine healing: Secondary | ICD-10-CM | POA: Diagnosis not present

## 2016-03-13 DIAGNOSIS — Z4789 Encounter for other orthopedic aftercare: Secondary | ICD-10-CM | POA: Diagnosis not present

## 2016-03-13 DIAGNOSIS — S52562D Barton's fracture of left radius, subsequent encounter for closed fracture with routine healing: Secondary | ICD-10-CM | POA: Diagnosis not present

## 2016-03-21 DIAGNOSIS — S52562D Barton's fracture of left radius, subsequent encounter for closed fracture with routine healing: Secondary | ICD-10-CM | POA: Diagnosis not present

## 2016-03-29 DIAGNOSIS — H04123 Dry eye syndrome of bilateral lacrimal glands: Secondary | ICD-10-CM | POA: Diagnosis not present

## 2016-03-29 DIAGNOSIS — H10413 Chronic giant papillary conjunctivitis, bilateral: Secondary | ICD-10-CM | POA: Diagnosis not present

## 2016-03-29 DIAGNOSIS — H47012 Ischemic optic neuropathy, left eye: Secondary | ICD-10-CM | POA: Diagnosis not present

## 2016-03-29 DIAGNOSIS — Z961 Presence of intraocular lens: Secondary | ICD-10-CM | POA: Diagnosis not present

## 2016-04-04 DIAGNOSIS — S52562A Barton's fracture of left radius, initial encounter for closed fracture: Secondary | ICD-10-CM | POA: Diagnosis not present

## 2016-04-12 DIAGNOSIS — S52562D Barton's fracture of left radius, subsequent encounter for closed fracture with routine healing: Secondary | ICD-10-CM | POA: Diagnosis not present

## 2016-06-22 DIAGNOSIS — S76911A Strain of unspecified muscles, fascia and tendons at thigh level, right thigh, initial encounter: Secondary | ICD-10-CM | POA: Diagnosis not present

## 2016-06-22 DIAGNOSIS — M79671 Pain in right foot: Secondary | ICD-10-CM | POA: Diagnosis not present

## 2016-06-22 DIAGNOSIS — M7581 Other shoulder lesions, right shoulder: Secondary | ICD-10-CM | POA: Diagnosis not present

## 2016-06-22 DIAGNOSIS — H9190 Unspecified hearing loss, unspecified ear: Secondary | ICD-10-CM | POA: Diagnosis not present

## 2016-07-10 DIAGNOSIS — Z961 Presence of intraocular lens: Secondary | ICD-10-CM | POA: Diagnosis not present

## 2016-09-12 DIAGNOSIS — Z23 Encounter for immunization: Secondary | ICD-10-CM | POA: Diagnosis not present

## 2016-10-09 DIAGNOSIS — G4489 Other headache syndrome: Secondary | ICD-10-CM | POA: Diagnosis not present

## 2016-10-09 DIAGNOSIS — Z7982 Long term (current) use of aspirin: Secondary | ICD-10-CM | POA: Diagnosis not present

## 2016-10-09 DIAGNOSIS — R040 Epistaxis: Secondary | ICD-10-CM | POA: Diagnosis not present

## 2016-10-25 IMAGING — CR DG FOREARM 2V*L*
3 series · 3 of 3 positions shown · non-contrast
Comparison: Radiograph 01/21/2016

CLINICAL DATA: Fall onto outstretched arm

EXAM:
LEFT FOREARM - 2 VIEW

[x forearm ap left]
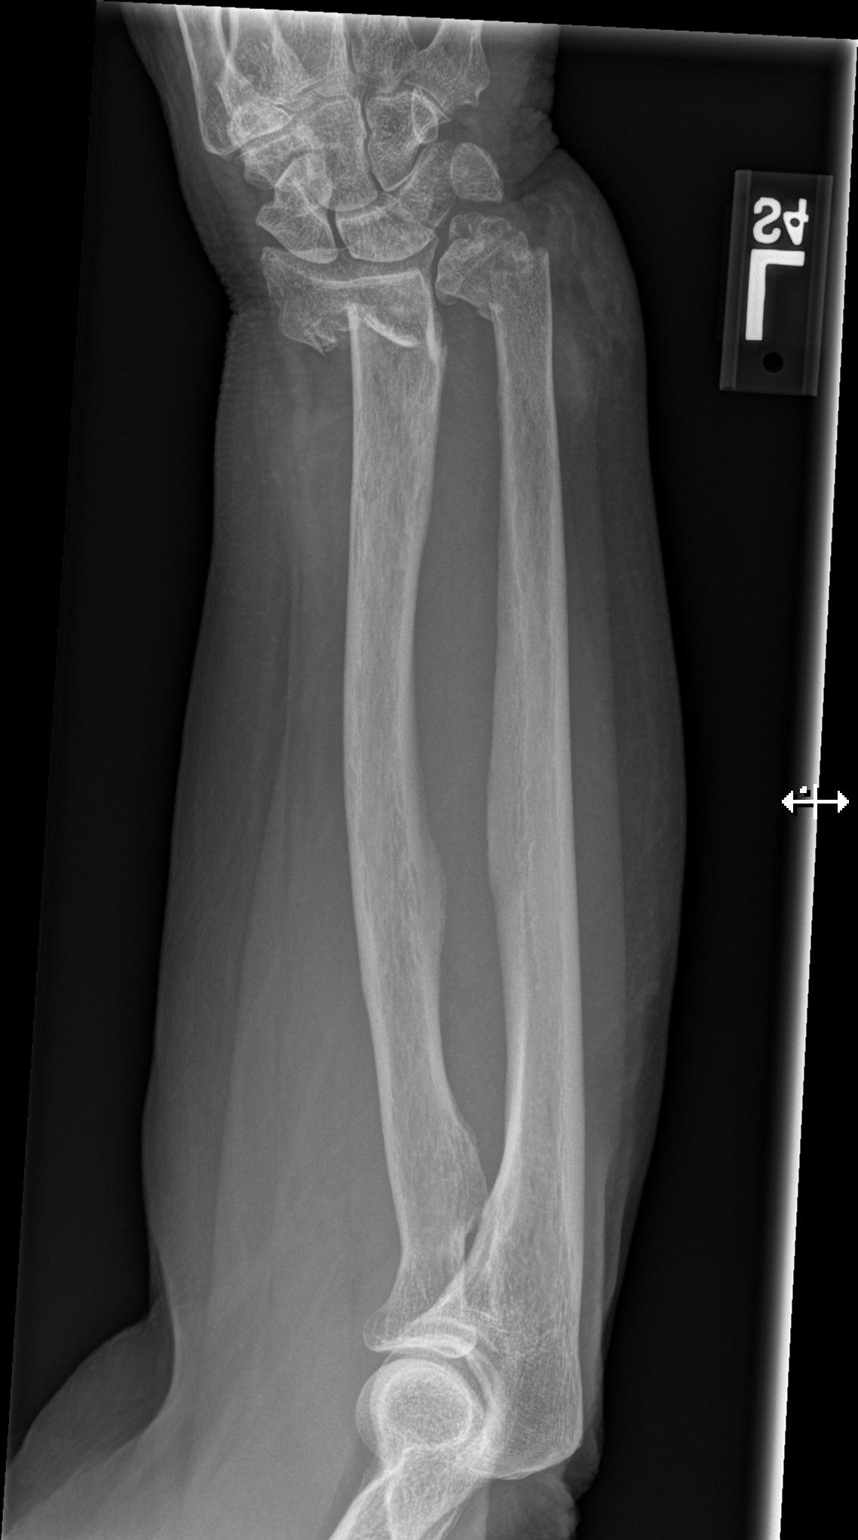

[x forearm lat left (1 of 2)]
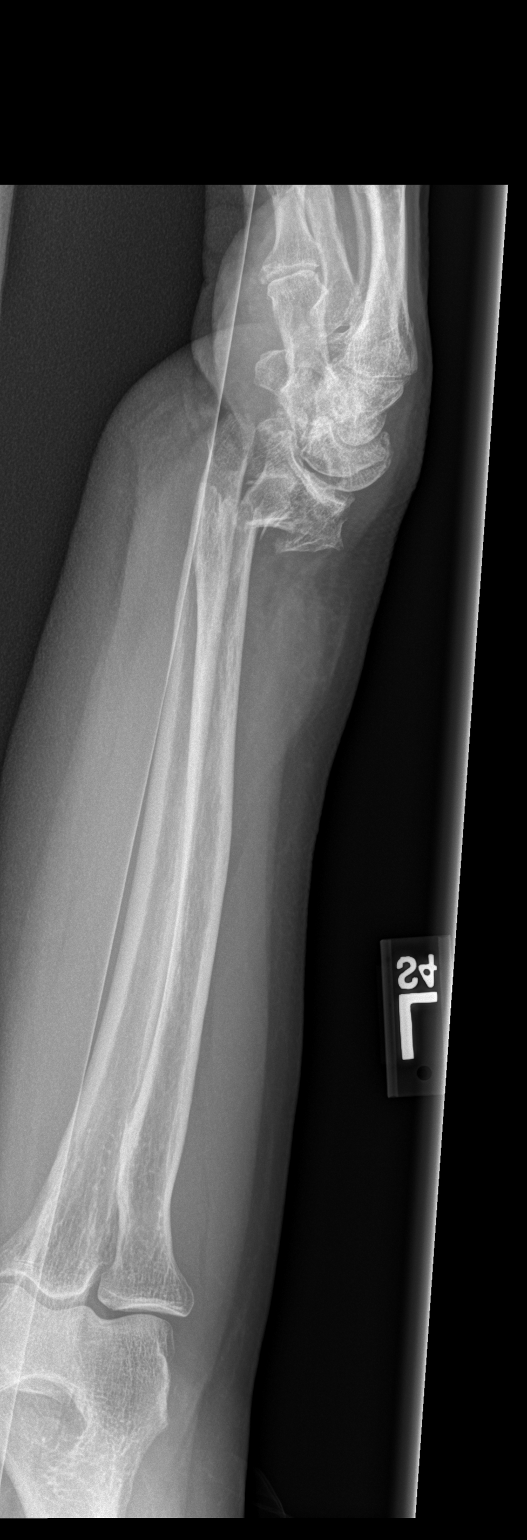

[x forearm lat left (2 of 2)]
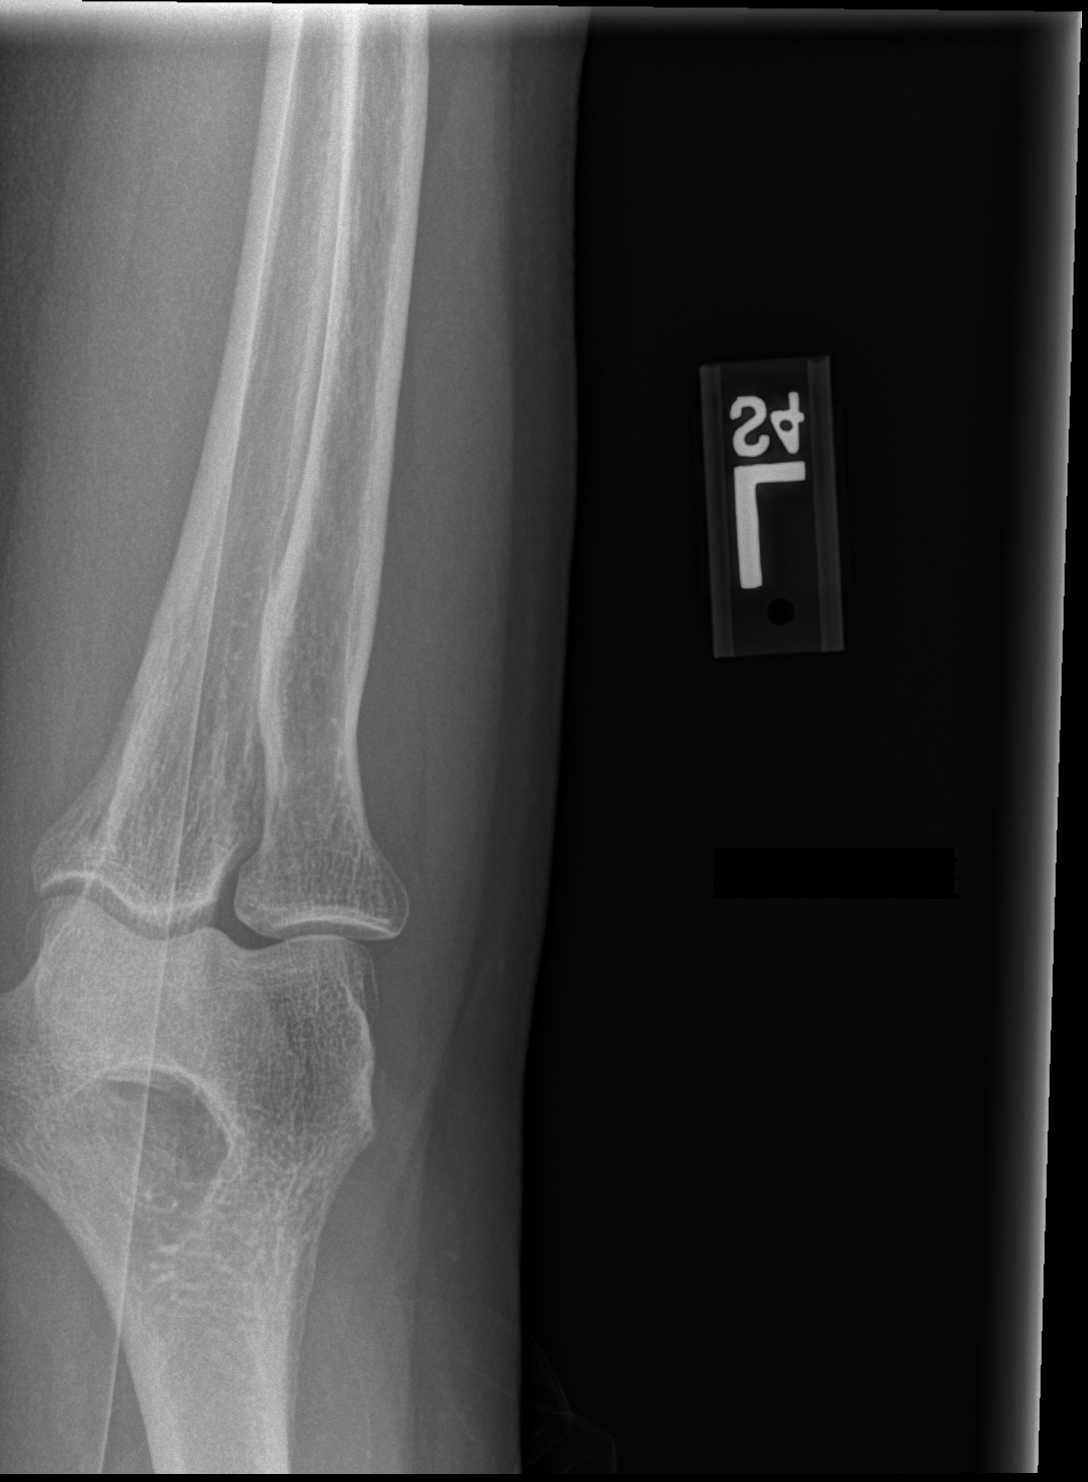

[3 of 3 positions shown; findings below may reference images not displayed]

FINDINGS: Fracture of the distal radius and ulna. Dorsal displacement of the
fracture fragments with dorsal angulation. Radiocarpal joint is
intact. No elbow dislocation.
IMPRESSION: Fractured distal radius and ulna with dorsal angulation and
displacement.

## 2017-01-10 DIAGNOSIS — Z1231 Encounter for screening mammogram for malignant neoplasm of breast: Secondary | ICD-10-CM | POA: Diagnosis not present

## 2017-01-10 DIAGNOSIS — Z01419 Encounter for gynecological examination (general) (routine) without abnormal findings: Secondary | ICD-10-CM | POA: Diagnosis not present

## 2017-01-15 DIAGNOSIS — E042 Nontoxic multinodular goiter: Secondary | ICD-10-CM | POA: Diagnosis not present

## 2017-01-15 DIAGNOSIS — E559 Vitamin D deficiency, unspecified: Secondary | ICD-10-CM | POA: Diagnosis not present

## 2017-01-15 DIAGNOSIS — Z8781 Personal history of (healed) traumatic fracture: Secondary | ICD-10-CM | POA: Diagnosis not present

## 2017-01-15 DIAGNOSIS — R29890 Loss of height: Secondary | ICD-10-CM | POA: Diagnosis not present

## 2017-01-15 DIAGNOSIS — E039 Hypothyroidism, unspecified: Secondary | ICD-10-CM | POA: Diagnosis not present

## 2017-02-14 DIAGNOSIS — F4320 Adjustment disorder, unspecified: Secondary | ICD-10-CM | POA: Diagnosis not present

## 2017-02-14 DIAGNOSIS — F419 Anxiety disorder, unspecified: Secondary | ICD-10-CM | POA: Diagnosis not present

## 2017-02-14 DIAGNOSIS — Z0000 Encounter for general adult medical examination without abnormal findings: Secondary | ICD-10-CM | POA: Diagnosis not present

## 2017-02-14 DIAGNOSIS — M179 Osteoarthritis of knee, unspecified: Secondary | ICD-10-CM | POA: Diagnosis not present

## 2017-02-14 DIAGNOSIS — K219 Gastro-esophageal reflux disease without esophagitis: Secondary | ICD-10-CM | POA: Diagnosis not present

## 2017-02-14 DIAGNOSIS — E559 Vitamin D deficiency, unspecified: Secondary | ICD-10-CM | POA: Diagnosis not present

## 2017-02-14 DIAGNOSIS — Z1389 Encounter for screening for other disorder: Secondary | ICD-10-CM | POA: Diagnosis not present

## 2017-02-14 DIAGNOSIS — Z79899 Other long term (current) drug therapy: Secondary | ICD-10-CM | POA: Diagnosis not present

## 2017-02-14 DIAGNOSIS — E782 Mixed hyperlipidemia: Secondary | ICD-10-CM | POA: Diagnosis not present

## 2017-02-14 DIAGNOSIS — F329 Major depressive disorder, single episode, unspecified: Secondary | ICD-10-CM | POA: Diagnosis not present

## 2017-02-14 DIAGNOSIS — H9190 Unspecified hearing loss, unspecified ear: Secondary | ICD-10-CM | POA: Diagnosis not present

## 2017-02-14 DIAGNOSIS — M79671 Pain in right foot: Secondary | ICD-10-CM | POA: Diagnosis not present

## 2017-02-14 DIAGNOSIS — M7581 Other shoulder lesions, right shoulder: Secondary | ICD-10-CM | POA: Diagnosis not present

## 2017-08-06 DIAGNOSIS — B372 Candidiasis of skin and nail: Secondary | ICD-10-CM | POA: Diagnosis not present

## 2017-08-06 DIAGNOSIS — M7581 Other shoulder lesions, right shoulder: Secondary | ICD-10-CM | POA: Diagnosis not present

## 2017-08-06 DIAGNOSIS — G2581 Restless legs syndrome: Secondary | ICD-10-CM | POA: Diagnosis not present

## 2017-09-24 DIAGNOSIS — H10413 Chronic giant papillary conjunctivitis, bilateral: Secondary | ICD-10-CM | POA: Diagnosis not present

## 2017-09-24 DIAGNOSIS — Z961 Presence of intraocular lens: Secondary | ICD-10-CM | POA: Diagnosis not present

## 2017-09-24 DIAGNOSIS — H47012 Ischemic optic neuropathy, left eye: Secondary | ICD-10-CM | POA: Diagnosis not present

## 2017-09-24 DIAGNOSIS — H16223 Keratoconjunctivitis sicca, not specified as Sjogren's, bilateral: Secondary | ICD-10-CM | POA: Diagnosis not present

## 2017-10-21 DIAGNOSIS — S00522A Blister (nonthermal) of oral cavity, initial encounter: Secondary | ICD-10-CM | POA: Diagnosis not present

## 2017-10-21 DIAGNOSIS — R591 Generalized enlarged lymph nodes: Secondary | ICD-10-CM | POA: Diagnosis not present

## 2017-10-25 DIAGNOSIS — S00522D Blister (nonthermal) of oral cavity, subsequent encounter: Secondary | ICD-10-CM | POA: Diagnosis not present

## 2017-10-25 DIAGNOSIS — S00522A Blister (nonthermal) of oral cavity, initial encounter: Secondary | ICD-10-CM | POA: Diagnosis not present

## 2018-01-16 ENCOUNTER — Other Ambulatory Visit: Payer: Self-pay | Admitting: Internal Medicine

## 2018-01-16 DIAGNOSIS — E042 Nontoxic multinodular goiter: Secondary | ICD-10-CM

## 2018-01-16 DIAGNOSIS — E559 Vitamin D deficiency, unspecified: Secondary | ICD-10-CM | POA: Diagnosis not present

## 2018-01-16 DIAGNOSIS — E039 Hypothyroidism, unspecified: Secondary | ICD-10-CM | POA: Diagnosis not present

## 2018-01-16 DIAGNOSIS — M85852 Other specified disorders of bone density and structure, left thigh: Secondary | ICD-10-CM | POA: Diagnosis not present

## 2018-01-16 DIAGNOSIS — Z8781 Personal history of (healed) traumatic fracture: Secondary | ICD-10-CM | POA: Diagnosis not present

## 2018-01-16 DIAGNOSIS — R5383 Other fatigue: Secondary | ICD-10-CM | POA: Diagnosis not present

## 2018-01-25 DIAGNOSIS — J189 Pneumonia, unspecified organism: Secondary | ICD-10-CM | POA: Diagnosis not present

## 2018-01-30 DIAGNOSIS — F5109 Other insomnia not due to a substance or known physiological condition: Secondary | ICD-10-CM | POA: Diagnosis not present

## 2018-01-30 DIAGNOSIS — J069 Acute upper respiratory infection, unspecified: Secondary | ICD-10-CM | POA: Diagnosis not present

## 2018-01-30 DIAGNOSIS — H109 Unspecified conjunctivitis: Secondary | ICD-10-CM | POA: Diagnosis not present

## 2018-02-18 ENCOUNTER — Other Ambulatory Visit: Payer: Self-pay | Admitting: Internal Medicine

## 2018-02-18 ENCOUNTER — Ambulatory Visit
Admission: RE | Admit: 2018-02-18 | Discharge: 2018-02-18 | Disposition: A | Discharge status: Home / Self Care | Payer: PPO | Source: Ambulatory Visit | Attending: Internal Medicine | Admitting: Internal Medicine

## 2018-02-18 DIAGNOSIS — E042 Nontoxic multinodular goiter: Secondary | ICD-10-CM | POA: Diagnosis not present

## 2018-02-18 DIAGNOSIS — R05 Cough: Secondary | ICD-10-CM

## 2018-02-18 DIAGNOSIS — R319 Hematuria, unspecified: Secondary | ICD-10-CM | POA: Diagnosis not present

## 2018-02-18 DIAGNOSIS — K219 Gastro-esophageal reflux disease without esophagitis: Secondary | ICD-10-CM | POA: Diagnosis not present

## 2018-02-18 DIAGNOSIS — R053 Chronic cough: Secondary | ICD-10-CM

## 2018-02-18 DIAGNOSIS — G4700 Insomnia, unspecified: Secondary | ICD-10-CM | POA: Diagnosis not present

## 2018-02-18 DIAGNOSIS — Z79899 Other long term (current) drug therapy: Secondary | ICD-10-CM | POA: Diagnosis not present

## 2018-02-18 DIAGNOSIS — E559 Vitamin D deficiency, unspecified: Secondary | ICD-10-CM | POA: Diagnosis not present

## 2018-02-18 DIAGNOSIS — R5382 Chronic fatigue, unspecified: Secondary | ICD-10-CM | POA: Diagnosis not present

## 2018-02-18 DIAGNOSIS — F4320 Adjustment disorder, unspecified: Secondary | ICD-10-CM | POA: Diagnosis not present

## 2018-02-18 DIAGNOSIS — F329 Major depressive disorder, single episode, unspecified: Secondary | ICD-10-CM | POA: Diagnosis not present

## 2018-02-18 DIAGNOSIS — F439 Reaction to severe stress, unspecified: Secondary | ICD-10-CM | POA: Diagnosis not present

## 2018-02-18 DIAGNOSIS — Z0000 Encounter for general adult medical examination without abnormal findings: Secondary | ICD-10-CM | POA: Diagnosis not present

## 2018-02-18 DIAGNOSIS — E782 Mixed hyperlipidemia: Secondary | ICD-10-CM | POA: Diagnosis not present

## 2018-02-18 DIAGNOSIS — Z1389 Encounter for screening for other disorder: Secondary | ICD-10-CM | POA: Diagnosis not present

## 2018-02-25 ENCOUNTER — Ambulatory Visit
Admission: RE | Admit: 2018-02-25 | Discharge: 2018-02-25 | Disposition: A | Discharge status: Home / Self Care | Payer: PPO | Source: Ambulatory Visit | Attending: Internal Medicine | Admitting: Internal Medicine

## 2018-02-25 ENCOUNTER — Other Ambulatory Visit: Payer: Self-pay | Admitting: Internal Medicine

## 2018-02-25 DIAGNOSIS — R319 Hematuria, unspecified: Secondary | ICD-10-CM

## 2018-03-12 DIAGNOSIS — M8588 Other specified disorders of bone density and structure, other site: Secondary | ICD-10-CM | POA: Diagnosis not present

## 2018-03-18 DIAGNOSIS — E559 Vitamin D deficiency, unspecified: Secondary | ICD-10-CM | POA: Diagnosis not present

## 2018-03-18 DIAGNOSIS — R05 Cough: Secondary | ICD-10-CM | POA: Diagnosis not present

## 2018-03-18 DIAGNOSIS — F439 Reaction to severe stress, unspecified: Secondary | ICD-10-CM | POA: Diagnosis not present

## 2018-03-18 DIAGNOSIS — G4700 Insomnia, unspecified: Secondary | ICD-10-CM | POA: Diagnosis not present

## 2018-03-18 DIAGNOSIS — R319 Hematuria, unspecified: Secondary | ICD-10-CM | POA: Diagnosis not present

## 2018-03-18 DIAGNOSIS — E782 Mixed hyperlipidemia: Secondary | ICD-10-CM | POA: Diagnosis not present

## 2018-03-27 DIAGNOSIS — E039 Hypothyroidism, unspecified: Secondary | ICD-10-CM | POA: Diagnosis not present

## 2018-03-27 DIAGNOSIS — M810 Age-related osteoporosis without current pathological fracture: Secondary | ICD-10-CM | POA: Diagnosis not present

## 2018-03-27 DIAGNOSIS — E559 Vitamin D deficiency, unspecified: Secondary | ICD-10-CM | POA: Diagnosis not present

## 2018-03-27 DIAGNOSIS — E042 Nontoxic multinodular goiter: Secondary | ICD-10-CM | POA: Diagnosis not present

## 2018-03-29 ENCOUNTER — Other Ambulatory Visit: Payer: Self-pay

## 2018-03-29 ENCOUNTER — Emergency Department (HOSPITAL_BASED_OUTPATIENT_CLINIC_OR_DEPARTMENT_OTHER): Payer: PPO

## 2018-03-29 ENCOUNTER — Emergency Department (HOSPITAL_BASED_OUTPATIENT_CLINIC_OR_DEPARTMENT_OTHER)
Admission: EM | Admit: 2018-03-29 | Discharge: 2018-03-29 | Disposition: A | Discharge status: Home / Self Care | Payer: PPO | Attending: Emergency Medicine | Admitting: Emergency Medicine

## 2018-03-29 ENCOUNTER — Encounter (HOSPITAL_BASED_OUTPATIENT_CLINIC_OR_DEPARTMENT_OTHER): Payer: Self-pay | Admitting: Adult Health

## 2018-03-29 DIAGNOSIS — W010XXA Fall on same level from slipping, tripping and stumbling without subsequent striking against object, initial encounter: Secondary | ICD-10-CM | POA: Insufficient documentation

## 2018-03-29 DIAGNOSIS — S6992XA Unspecified injury of left wrist, hand and finger(s), initial encounter: Secondary | ICD-10-CM | POA: Diagnosis not present

## 2018-03-29 DIAGNOSIS — Y999 Unspecified external cause status: Secondary | ICD-10-CM | POA: Insufficient documentation

## 2018-03-29 DIAGNOSIS — Z79899 Other long term (current) drug therapy: Secondary | ICD-10-CM | POA: Diagnosis not present

## 2018-03-29 DIAGNOSIS — E039 Hypothyroidism, unspecified: Secondary | ICD-10-CM | POA: Diagnosis not present

## 2018-03-29 DIAGNOSIS — Y929 Unspecified place or not applicable: Secondary | ICD-10-CM | POA: Diagnosis not present

## 2018-03-29 DIAGNOSIS — M79642 Pain in left hand: Secondary | ICD-10-CM | POA: Diagnosis not present

## 2018-03-29 DIAGNOSIS — S82041A Displaced comminuted fracture of right patella, initial encounter for closed fracture: Secondary | ICD-10-CM | POA: Insufficient documentation

## 2018-03-29 DIAGNOSIS — Y939 Activity, unspecified: Secondary | ICD-10-CM | POA: Insufficient documentation

## 2018-03-29 DIAGNOSIS — S00531A Contusion of lip, initial encounter: Secondary | ICD-10-CM | POA: Diagnosis not present

## 2018-03-29 DIAGNOSIS — S01511A Laceration without foreign body of lip, initial encounter: Secondary | ICD-10-CM | POA: Insufficient documentation

## 2018-03-29 DIAGNOSIS — S0990XA Unspecified injury of head, initial encounter: Secondary | ICD-10-CM | POA: Diagnosis present

## 2018-03-29 MED ORDER — ONDANSETRON 4 MG PO TBDP
4.0000 mg | ORAL_TABLET | Freq: Once | ORAL | Status: AC
  Administered 2018-03-29: 4 mg via ORAL
  Filled 2018-03-29: qty 1

## 2018-03-29 MED ORDER — HYDROCODONE-ACETAMINOPHEN 5-325 MG PO TABS
1.0000 | ORAL_TABLET | Freq: Once | ORAL | Status: AC
  Administered 2018-03-29: 1 via ORAL
  Filled 2018-03-29: qty 1

## 2018-03-29 MED ORDER — HYDROCODONE-ACETAMINOPHEN 5-325 MG PO TABS: 1.0000 | ORAL_TABLET | ORAL | 0 refills | Status: DC | PRN

## 2018-03-29 NOTE — ED Provider Notes (Signed)
[facility] EMERGENCY DEPARTMENT Provider Note   CSN: [id number] Arrival date & time: 03/29/18  5784     History   Chief Complaint Chief Complaint  Patient presents with  . Fall    HPI Kylie Christensen is a 77 y.o. female.  Pt presents to the ED today s/p fall.  She said her tennis shoe tripped on the rug and she fell face forward.  She has pain to her left hand, right knee, and lower lip.  She denies loc.     Past Medical History:  Diagnosis Date  . Anxiety   . Colitis   . GERD (gastroesophageal reflux disease)   . Headache   . Hypothyroidism     Patient Active Problem List   Diagnosis Date Noted  . Distal radius fracture, left 01/21/2016    Past Surgical History:  Procedure Laterality Date  . ABDOMINAL HYSTERECTOMY     partial  . BACK SURGERY  nov 2011   lower back  . BALLOON DILATION N/A 01/25/2015   Procedure: BALLOON DILATION;  Surgeon: Garlan Fair, MD;  Location: [facility];  Service: Endoscopy;  Laterality: N/A;  . BIOPSY BREAST Left yrs ago   benign  . COLONOSCOPY WITH PROPOFOL N/A 01/25/2015   Procedure: COLONOSCOPY WITH PROPOFOL;  Surgeon: Garlan Fair, MD;  Location: [facility];  Service: Endoscopy;  Laterality: N/A;  . ESOPHAGOGASTRODUODENOSCOPY (EGD) WITH PROPOFOL N/A 01/25/2015   Procedure: ESOPHAGOGASTRODUODENOSCOPY (EGD) WITH PROPOFOL;  Surgeon: Garlan Fair, MD;  Location: [facility];  Service: Endoscopy;  Laterality: N/A;  . OPEN REDUCTION INTERNAL FIXATION (ORIF) DISTAL RADIAL FRACTURE Left 01/21/2016   Procedure: OPEN REDUCTION INTERNAL FIXATION (ORIF) DISTAL RADIAL FRACTURE;  Surgeon: Iran Planas, MD;  Location: [facility];  Service: Orthopedics;  Laterality: Left;  . rotator cuffr Right   . thyroid nodule removed  1970  . TONSILLECTOMY  age 49     OB History   None      Home Medications    Prior to Admission medications   Medication Sig Start Date End Date Taking? Authorizing Provider  B Complex-C (B-COMPLEX  WITH VITAMIN C) tablet Take 1 tablet by mouth daily.   Yes [provider]  beta carotene w/minerals (OCUVITE) tablet Take 1 tablet by mouth daily.   Yes [provider]  calcium carbonate (OS-CAL - DOSED IN MG OF ELEMENTAL CALCIUM) 1250 (500 Ca) MG tablet Take 1 tablet by mouth.   Yes [provider]  cholecalciferol (VITAMIN D) 1000 units tablet Take 2,000 Units by mouth daily.   Yes [provider]  cycloSPORINE (RESTASIS) 0.05 % ophthalmic emulsion Place 1 drop into both eyes 2 (two) times daily.    Yes [provider]  levothyroxine (SYNTHROID, LEVOTHROID) 50 MCG tablet Take 25-50 mcg by mouth every other day. Alternates between 36mcg and 50 mcg. Brand name only   Yes [provider]  levothyroxine (SYNTHROID, LEVOTHROID) 50 MCG tablet Take by mouth.   Yes [provider]  Multiple Vitamin (MULTIVITAMIN WITH MINERALS) TABS tablet Take 1 tablet by mouth daily.   Yes [provider]  omega-3 acid ethyl esters (LOVAZA) 1 g capsule Take 2 g by mouth daily.   Yes [provider]  Polyvinyl Alcohol-Povidone (REFRESH OP) Apply 1 drop to eye 3 (three) times daily as needed (Dry eyes).   Yes [provider]  sertraline (ZOLOFT) 50 MG tablet TAKE 1/2 TABLET BY MOUTH ONCE A DAY FOR ONE WEEK THEN 1 TABLET BY  MOUTH DAILY 02/18/18  Yes [provider]  Skin Protectants, Misc. (EUCERIN) cream Apply 1 application topically daily as needed for dry skin.   Yes [provider]  vitamin C (ASCORBIC ACID) 500 MG tablet Take 1 tablet (500 mg total) by mouth daily. 01/21/16  Yes Iran Planas, MD  docusate sodium (COLACE) 100 MG capsule Take 1 capsule (100 mg total) by mouth 2 (two) times daily. 01/21/16   Iran Planas, MD  FLUoxetine (PROZAC) 20 MG capsule Take 20 mg by mouth daily.    [provider]  HYDROcodone-acetaminophen (NORCO/VICODIN) 5-325 MG tablet Take 1 tablet by mouth every 4 (four) hours as  needed. 03/29/18   Isla Pence, MD  methocarbamol (ROBAXIN) 500 MG tablet Take 1 tablet (500 mg total) by mouth 4 (four) times daily. 01/21/16   Iran Planas, MD  oxyCODONE-acetaminophen (ROXICET) 5-325 MG tablet Take 1 tablet by mouth every 4 (four) hours as needed for severe pain. 01/21/16   Iran Planas, MD    Family History History reviewed. No pertinent family history.  Social History Social History   Tobacco Use  . Smoking status: Never Smoker  . Smokeless tobacco: Never Used  Substance Use Topics  . Alcohol use: Yes    Comment: occasional  . Drug use: No     Allergies   Amoxicillin   Review of Systems Review of Systems  HENT:       Lower lip lac  Musculoskeletal:       Left hand, right knee pain  All other systems reviewed and are negative.    Physical Exam Updated Vital Signs BP 129/66   Pulse 72   Temp (!) 97.5 F (36.4 C) (Oral)   Resp 18   Ht 5\' 5"  (1.651 m)   Wt 77.1 kg (170 lb)   SpO2 100%   BMI 28.29 kg/m   Physical Exam  Constitutional: She is oriented to person, place, and time. She appears well-developed and well-nourished.  HENT:  Head: Normocephalic.  Right Ear: External ear normal.  Left Ear: External ear normal.  Nose: Nose normal.  Mouth/Throat: Oropharynx is clear and moist.  Small, non-gaping wound left lower lip  Eyes: Pupils are equal, round, and reactive to light. Conjunctivae and EOM are normal.  Neck: Normal range of motion. Neck supple.  Cardiovascular: Normal rate, regular rhythm, normal heart sounds and intact distal pulses.  Pulmonary/Chest: Effort normal and breath sounds normal.  Abdominal: Soft. Bowel sounds are normal.  Musculoskeletal:       Right knee: She exhibits swelling. Tenderness found.       Left hand: She exhibits tenderness.       Hands: Neurological: She is alert and oriented to person, place, and time.  Skin: Skin is warm. Capillary refill takes less than 2 seconds.  Psychiatric: She has a normal  mood and affect. Her behavior is normal. Judgment and thought content normal.  Nursing note and vitals reviewed.    ED Treatments / Results  Labs (all labs ordered are listed, but only abnormal results are displayed) Labs Reviewed - No data to display  EKG None  Radiology Dg Knee Complete 4 Views Right  Result Date: 03/29/2018 CLINICAL DATA:  Pain fall following fall EXAM: RIGHT KNEE - COMPLETE 4+ VIEW COMPARISON:  None. FINDINGS: Frontal, lateral, and bilateral oblique views were obtained. There is a comminuted fracture of the patella with displacement of fracture fragments, most notably in the mid patellar region. No other fracture. No dislocation. There is  a large joint effusion. There is moderate narrowing of the patellofemoral joint and medial compartment with spurring along the posterior patella and medial compartment regions. No erosive change. There is chondrocalcinosis medially. IMPRESSION: 1. Comminuted fracture mid and inferior patella with displaced fracture fragments, primarily in the mid patellar region. 2.  Large joint effusion, likely hemarthrosis. 3.  No other fractures.  No dislocation. 4.  Osteoarthritic change medially and in the patellofemoral joint. 5. Chondrocalcinosis medially. Chondrocalcinosis may be seen with osteoarthritis or with calcium pyrophosphate deposition disease. Electronically Signed   By: Lowella Grip III M.D.   On: 03/29/2018 19:26   Dg Hand Complete Left  Result Date: 03/29/2018 CLINICAL DATA:  Pain following fall EXAM: LEFT HAND - COMPLETE 3+ VIEW COMPARISON:  Apr 23, 2015 FINDINGS: Frontal, oblique, and lateral views were obtained. There is screw and plate fixation through a prior fracture of the distal radius with remodeling. There is evidence of prior fracture of the distal ulna with remodeling. There is chronic avulsion of the ulnar styloid. There is no evident acute fracture or dislocation. There is moderate osteoarthritic change in the first  carpal-metacarpal joint and scaphotrapezial joint. There is also mild narrowing of all MCP, PIP, and DIP joints. Calcification is noted in the dorsal aspect of the first IP joint, stable. No erosive change. Note that there is a degree of underlying osteoporosis. IMPRESSION: Old trauma in the distal radius and ulna regions. No acute fracture or dislocation. Multifocal osteoarthritic change, most marked in the first carpal-metacarpal and scaphotrapezial joint regions. No erosive change. Bones are osteoporotic. Electronically Signed   By: Lowella Grip III M.D.   On: 03/29/2018 19:24    Procedures .Marland KitchenLaceration Repair Date/Time: 03/29/2018 7:42 PM Performed by: Isla Pence, MD Authorized by: Isla Pence, MD   Consent:    Consent obtained:  Verbal   Consent given by:  Patient   Risks discussed:  Infection and pain   Alternatives discussed:  No treatment Anesthesia (see MAR for exact dosages):    Anesthesia method:  None Laceration details:    Location:  Lip   Lip location:  Lower exterior lip Repair type:    Repair type:  Simple Pre-procedure details:    Preparation:  Patient was prepped and draped in usual sterile fashion Exploration:    Hemostasis achieved with:  Direct pressure   Contaminated: no   Treatment:    Amount of cleaning:  Standard   Irrigation solution:  Sterile saline Skin repair:    Repair method:  Tissue adhesive Approximation:    Approximation:  Close   Vermilion border: poorly aligned   Post-procedure details:    Dressing:  Open (no dressing)   Patient tolerance of procedure:  Tolerated well, no immediate complications   (including critical care time)  Medications Ordered in ED Medications  HYDROcodone-acetaminophen (NORCO/VICODIN) 5-325 MG per tablet 1 tablet (1 tablet Oral Given 03/29/18 1851)  ondansetron (ZOFRAN-ODT) disintegrating tablet 4 mg (4 mg Oral Given 03/29/18 1851)     Initial Impression / Assessment and Plan / ED Course  I have  reviewed the triage vital signs and the nursing notes.  Pertinent labs & imaging results that were available during my care of the patient were reviewed by me and considered in my medical decision making (see chart for details).  Pt said she has been on crutches in the past and has done well.  She would rather do crutches again.  She has seen Dr. Durward Fortes in 2016 for the same fracture  to her left knee.  The pt wishes to f/u with him again.  The pt knows to return if worse.  Final Clinical Impressions(s) / ED Diagnoses   Final diagnoses:  Contusion of lip, initial encounter  Closed displaced comminuted fracture of right patella, initial encounter    ED Discharge Orders        Ordered    HYDROcodone-acetaminophen (NORCO/VICODIN) 5-325 MG tablet  Every 4 hours PRN     03/29/18 1942       Isla Pence, MD 03/29/18 1945

## 2018-03-29 NOTE — ED Notes (Signed)
Pt and family given d/c instructions as per chart Rx x 1 with precautions. Verbalizes understanding. No questions.

## 2018-03-29 NOTE — ED Notes (Signed)
EMT reviewed the application of the knee immobilizer and how to use the crutches. Patient felt the crutches were too unstable for her right now. EMT brought a walker to room to try with the patient. Patient said the walker was a more comfortable for her and she felt more stable. Doctor had to give a prescription for a walker. Patient understood the directions on how to use the walker and the knee immobilizer. Ice packs were given as well.

## 2018-03-29 NOTE — ED Triage Notes (Addendum)
Presents post trip and fall on right knee, left wrist and hit bottom lip. She states her foot got caught in a rug. The pain is worse with movement of the knee.  Denies LOC. Endorses nausea.

## 2018-03-31 ENCOUNTER — Ambulatory Visit (INDEPENDENT_AMBULATORY_CARE_PROVIDER_SITE_OTHER): Payer: PPO | Admitting: Orthopaedic Surgery

## 2018-03-31 ENCOUNTER — Encounter (INDEPENDENT_AMBULATORY_CARE_PROVIDER_SITE_OTHER): Payer: Self-pay | Admitting: Orthopaedic Surgery

## 2018-03-31 VITALS — BP 111/59 | HR 80 | Resp 16 | Ht 63.0 in | Wt 176.0 lb

## 2018-03-31 DIAGNOSIS — S82041A Displaced comminuted fracture of right patella, initial encounter for closed fracture: Secondary | ICD-10-CM | POA: Insufficient documentation

## 2018-03-31 NOTE — Progress Notes (Deleted)
Office Visit Note   Patient: Kylie Christensen           Date of Birth: 1941-02-18           MRN: [id number] Visit Date: 03/31/2018              Requested by: Josetta Huddle, MD [address] [place], [place] [address] PCP: Josetta Huddle, MD   Assessment & Plan: Visit Diagnoses:  1. Closed displaced comminuted fracture of right patella, initial encounter     Plan: ***  Follow-Up Instructions: Return will schedule surgery.   Orders:  No orders of the defined types were placed in this encounter.  No orders of the defined types were placed in this encounter.     Procedures: No procedures performed   Clinical Data: No additional findings.   Subjective: Chief Complaint  Patient presents with  . Right Knee - Fracture  . Knee Pain    Right knee patella fracture, fell 03/29/18    HPI  Review of Systems  Constitutional: Positive for activity change.  HENT: Negative for trouble swallowing.   Eyes: Positive for pain.  Respiratory: Negative for shortness of breath.   Cardiovascular: Positive for leg swelling.  Gastrointestinal: Positive for diarrhea.  Endocrine: Positive for heat intolerance.  Genitourinary: Positive for difficulty urinating.  Musculoskeletal: Positive for gait problem and joint swelling.  Skin: Negative for rash.  Allergic/Immunologic: Negative for food allergies.  Neurological: Positive for weakness and numbness.  Hematological: Bruises/bleeds easily.  Psychiatric/Behavioral: Positive for sleep disturbance.     Objective: Vital Signs: BP (!) 111/59 (BP Location: Left Arm, Patient Position: Sitting, Cuff Size: Normal)   Pulse 80   Resp 16   Ht 5\' 3"  (1.6 m)   Wt 176 lb (79.8 kg)   BMI 31.18 kg/m   Physical Exam  Ortho Exam  Specialty Comments:  No specialty comments available.  Imaging: No results found.   PMFS History: Patient Active Problem List   Diagnosis Date Noted  . Closed displaced comminuted fracture of  right patella 03/31/2018  . Distal radius fracture, left 01/21/2016   Past Medical History:  Diagnosis Date  . Anxiety   . Colitis   . GERD (gastroesophageal reflux disease)   . Headache   . Hypothyroidism     History reviewed. No pertinent family history.  Past Surgical History:  Procedure Laterality Date  . ABDOMINAL HYSTERECTOMY     partial  . BACK SURGERY  nov 2011   lower back  . BALLOON DILATION N/A 01/25/2015   Procedure: BALLOON DILATION;  Surgeon: Garlan Fair, MD;  Location: [facility];  Service: Endoscopy;  Laterality: N/A;  . BIOPSY BREAST Left yrs ago   benign  . COLONOSCOPY WITH PROPOFOL N/A 01/25/2015   Procedure: COLONOSCOPY WITH PROPOFOL;  Surgeon: Garlan Fair, MD;  Location: [facility];  Service: Endoscopy;  Laterality: N/A;  . ESOPHAGOGASTRODUODENOSCOPY (EGD) WITH PROPOFOL N/A 01/25/2015   Procedure: ESOPHAGOGASTRODUODENOSCOPY (EGD) WITH PROPOFOL;  Surgeon: Garlan Fair, MD;  Location: [facility];  Service: Endoscopy;  Laterality: N/A;  . OPEN REDUCTION INTERNAL FIXATION (ORIF) DISTAL RADIAL FRACTURE Left 01/21/2016   Procedure: OPEN REDUCTION INTERNAL FIXATION (ORIF) DISTAL RADIAL FRACTURE;  Surgeon: Iran Planas, MD;  Location: [place];  Service: Orthopedics;  Laterality: Left;  . rotator cuffr Right   . SHOULDER ARTHROSCOPY    . thyroid nodule removed  1970  . TONSILLECTOMY  age 32   Social History   Occupational History  .  Not on file  Tobacco Use  . Smoking status: Never Smoker  . Smokeless tobacco: Never Used  Substance and Sexual Activity  . Alcohol use: Yes    Comment: occasional  . Drug use: No  . Sexual activity: Not on file

## 2018-03-31 NOTE — Progress Notes (Signed)
Office Visit Note   Patient: Kylie Christensen           Date of Birth: 1941-01-25           MRN: [id number] Visit Date: 03/31/2018              Requested by: Josetta Huddle, MD [address] [place], [place] [address] PCP: Josetta Huddle, MD   Assessment & Plan: Visit Diagnoses:  1. Closed displaced comminuted fracture of right patella, initial encounter     Plan: 2 days status post closed, displaced comminuted fracture right fall.  Will require open reduction internal fixation.  Discussed the above with Kylie Christensen.  Wears knee immobilizer.  Has hydrocodone for pain.  We will schedule Thursday  Follow-Up Instructions: Return will schedule surgery.   Orders:  No orders of the defined types were placed in this encounter.  No orders of the defined types were placed in this encounter.     Procedures: No procedures performed   Clinical Data: No additional findings.   Subjective: No chief complaint on file. Kylie Christensen fell Saturday night i.e. 2 nights ago landing directly on her right knee.  She experienced immediate onset of pain.  Seen in the [facility] emergency room on [address].  X-rays demonstrated a closed, comminuted displaced patella fracture.  Has hydrocodone for pain.  Uses crutches and a knee immobilizer. I reviewed the films of her right patella on the PACS system.  There is a transverse fracture through the center of the patella and a longitudinal fracture of the inferior pole.  There is a gap between the fragments. HPI  Review of Systems   Objective: Vital Signs: BP (!) 111/59 (BP Location: Left Arm, Patient Position: Sitting, Cuff Size: Normal)   Pulse 80   Resp 16   Ht 5\' 3"  (1.6 m)   Wt 176 lb (79.8 kg)   BMI 31.18 kg/m   Physical Exam  Ortho Exam awake alert and oriented x3.  Comfortable sitting.  Knee immobilizer removed from right knee.  Positive hemarthrosis.  Unable to extend knee.  Feel a small gap in the mid patella  associated with pain.  Skin intact.  No swelling distally.  No calf pain.  Neurovascular exam intact  Specialty Comments:  No specialty comments available.  Imaging: No results found.   PMFS History: Patient Active Problem List   Diagnosis Date Noted  . Closed displaced comminuted fracture of right patella 03/31/2018  . Distal radius fracture, left 01/21/2016   Past Medical History:  Diagnosis Date  . Anxiety   . Colitis   . GERD (gastroesophageal reflux disease)   . Headache   . Hypothyroidism     History reviewed. No pertinent family history.  Past Surgical History:  Procedure Laterality Date  . ABDOMINAL HYSTERECTOMY     partial  . BACK SURGERY  nov 2011   lower back  . BALLOON DILATION N/A 01/25/2015   Procedure: BALLOON DILATION;  Surgeon: Garlan Fair, MD;  Location: [facility];  Service: Endoscopy;  Laterality: N/A;  . BIOPSY BREAST Left yrs ago   benign  . COLONOSCOPY WITH PROPOFOL N/A 01/25/2015   Procedure: COLONOSCOPY WITH PROPOFOL;  Surgeon: Garlan Fair, MD;  Location: [facility];  Service: Endoscopy;  Laterality: N/A;  . ESOPHAGOGASTRODUODENOSCOPY (EGD) WITH PROPOFOL N/A 01/25/2015   Procedure: ESOPHAGOGASTRODUODENOSCOPY (EGD) WITH PROPOFOL;  Surgeon: Garlan Fair, MD;  [facility];  Service: Endoscopy;  Laterality: N/A;  .  OPEN REDUCTION INTERNAL FIXATION (ORIF) DISTAL RADIAL FRACTURE Left 01/21/2016   Procedure: OPEN REDUCTION INTERNAL FIXATION (ORIF) DISTAL RADIAL FRACTURE;  Surgeon: Iran Planas, MD;  Location: [place];  Service: Orthopedics;  Laterality: Left;  . rotator cuffr Right   . SHOULDER ARTHROSCOPY    . thyroid nodule removed  1970  . TONSILLECTOMY  age 67   Social History   Occupational History  . Not on file  Tobacco Use  . Smoking status: Never Smoker  . Smokeless tobacco: Never Used  Substance and Sexual Activity  . Alcohol use: Yes    Comment: occasional  . Drug use: No  . Sexual activity: Not on file      Garald Balding, MD   Note - This record has been created using [employer].  Chart creation errors have been sought, but may not always  have been located. Such creation errors do not reflect on  the standard of medical care.

## 2018-04-01 ENCOUNTER — Ambulatory Visit (INDEPENDENT_AMBULATORY_CARE_PROVIDER_SITE_OTHER): Payer: Self-pay | Admitting: Orthopaedic Surgery

## 2018-04-02 ENCOUNTER — Telehealth (INDEPENDENT_AMBULATORY_CARE_PROVIDER_SITE_OTHER): Payer: Self-pay | Admitting: Orthopaedic Surgery

## 2018-04-02 NOTE — Telephone Encounter (Signed)
Patient called stating that she has questions regarding her post-op surgery.  Patient states she lives at [facility] and would like to be set up to have home health care.

## 2018-04-03 ENCOUNTER — Encounter: Payer: Self-pay | Admitting: Orthopaedic Surgery

## 2018-04-03 ENCOUNTER — Telehealth (INDEPENDENT_AMBULATORY_CARE_PROVIDER_SITE_OTHER): Payer: Self-pay | Admitting: Orthopaedic Surgery

## 2018-04-03 DIAGNOSIS — W19XXXA Unspecified fall, initial encounter: Secondary | ICD-10-CM | POA: Diagnosis not present

## 2018-04-03 DIAGNOSIS — G8918 Other acute postprocedural pain: Secondary | ICD-10-CM | POA: Diagnosis not present

## 2018-04-03 DIAGNOSIS — S82041A Displaced comminuted fracture of right patella, initial encounter for closed fracture: Secondary | ICD-10-CM | POA: Diagnosis not present

## 2018-04-03 DIAGNOSIS — S82001A Unspecified fracture of right patella, initial encounter for closed fracture: Secondary | ICD-10-CM | POA: Diagnosis not present

## 2018-04-03 DIAGNOSIS — S83001A Unspecified subluxation of right patella, initial encounter: Secondary | ICD-10-CM | POA: Diagnosis not present

## 2018-04-03 NOTE — Telephone Encounter (Signed)
Pharmacy called to verify quantity for patients Oxycodone Rx. Was told per Aaron Edelman quantity of 22. Per pharmacist rx looks looks 69 or 55. Told pharmacist to count as 40, and would advise Dr. Durward Fortes.

## 2018-04-03 NOTE — Telephone Encounter (Signed)
Sent fax that Dr. Durward Fortes [facility]

## 2018-04-07 ENCOUNTER — Ambulatory Visit (INDEPENDENT_AMBULATORY_CARE_PROVIDER_SITE_OTHER): Payer: PPO | Admitting: Orthopaedic Surgery

## 2018-04-07 ENCOUNTER — Encounter (INDEPENDENT_AMBULATORY_CARE_PROVIDER_SITE_OTHER): Payer: Self-pay | Admitting: Orthopaedic Surgery

## 2018-04-07 VITALS — BP 119/72 | HR 80 | Resp 16 | Ht 63.0 in | Wt 176.0 lb

## 2018-04-07 DIAGNOSIS — S82091A Other fracture of right patella, initial encounter for closed fracture: Secondary | ICD-10-CM

## 2018-04-07 NOTE — Progress Notes (Signed)
Office Visit Note   Patient: Kylie Christensen           Date of Birth: 1941-04-19           MRN: [id number] Visit Date: 04/07/2018              Requested by: Josetta Huddle, MD [address] [place], [place] [address] PCP: Josetta Huddle, MD   Assessment & Plan: Visit Diagnoses:  1. Other closed fracture of right patella, initial encounter     Plan: 4 days status post open reduction internal fixation of a displaced comminuted right patella fracture.  At friend's home with her husband.  Placed in respit care over the weekend.  No fever or chills.  Will allow weightbearing as tolerated in the knee immobilizer with crutches or walker.  Start physical therapy office 1 week  Follow-Up Instructions: Return in about 1 week (around 04/14/2018).   Orders:  Orders Placed This Encounter  Procedures  . Ambulatory referral to Physical Therapy   No orders of the defined types were placed in this encounter.     Procedures: No procedures performed   Clinical Data: No additional findings.   Subjective: Chief Complaint  Patient presents with  . Right Knee - Routine Post Op  . Post-op Follow-up    03-31-18 R KNEE SURGERY F/U NO A LITTLE SORE SOME NUMBNESS IN TOES   4 days status post open reduction internal fixation of a displaced, comminuted right patella fracture.  There was a transverse fracture through the equator of the patella with a longitudinal fracture through the center of the inferior fragment.  I used to crossed screws and a figure-of-eight FiberWire.  Supplemented the above with a circumferential FiberWire about the patella.  No fever chills shortness of breath or chest pain  HPI  Review of Systems  Constitutional: Positive for fatigue.  HENT: Negative for ear pain.   Eyes: Positive for pain.  Respiratory: Negative for cough and shortness of breath.   Cardiovascular: Positive for leg swelling.  Gastrointestinal: Positive for diarrhea. Negative for  constipation.  Genitourinary: Negative for difficulty urinating.  Musculoskeletal: Negative for back pain and neck pain.  Skin: Negative for rash.  Allergic/Immunologic: Negative for food allergies.  Neurological: Positive for weakness and numbness.  Hematological: Bruises/bleeds easily.  Psychiatric/Behavioral: Negative for sleep disturbance.     Objective: Vital Signs: BP 119/72 (BP Location: Left Arm, Patient Position: Sitting, Cuff Size: Normal)   Pulse 80   Resp 16   Ht 5\' 3"  (1.6 m)   Wt 176 lb (79.8 kg)   BMI 31.18 kg/m   Physical Exam  Ortho Exam awake alert and oriented x3.  Comfortable sitting.  Placed on the examining table.  Dressing removed from right knee.  Wound is healing without problem.  Removed every other staple.  Applied an Aquacel dressing.  Calf pain.  No distal edema.  Neurovascular exam intact  Specialty Comments:  No specialty comments available.  Imaging: No results found.   PMFS History: Patient Active Problem List   Diagnosis Date Noted  . Closed displaced comminuted fracture of right patella 03/31/2018  . Distal radius fracture, left 01/21/2016   Past Medical History:  Diagnosis Date  . Anxiety   . Colitis   . GERD (gastroesophageal reflux disease)   . Headache   . Hypothyroidism     History reviewed. No pertinent family history.  Past Surgical History:  Procedure Laterality Date  . ABDOMINAL HYSTERECTOMY  partial  . BACK SURGERY  nov 2011   lower back  . BALLOON DILATION N/A 01/25/2015   Procedure: BALLOON DILATION;  Surgeon: Garlan Fair, MD;  Location: [facility];  Service: Endoscopy;  Laterality: N/A;  . BIOPSY BREAST Left yrs ago   benign  . COLONOSCOPY WITH PROPOFOL N/A 01/25/2015   Procedure: COLONOSCOPY WITH PROPOFOL;  Surgeon: Garlan Fair, MD;  [facility]: [facility];  Service: Endoscopy;  Laterality: N/A;  . ESOPHAGOGASTRODUODENOSCOPY (EGD) WITH PROPOFOL N/A 01/25/2015   Procedure: ESOPHAGOGASTRODUODENOSCOPY  (EGD) WITH PROPOFOL;  Surgeon: Garlan Fair, MD;  [facility];  Service: Endoscopy;  Laterality: N/A;  . KNEE SURGERY    . OPEN REDUCTION INTERNAL FIXATION (ORIF) DISTAL RADIAL FRACTURE Left 01/21/2016   Procedure: OPEN REDUCTION INTERNAL FIXATION (ORIF) DISTAL RADIAL FRACTURE;  Surgeon: Iran Planas, MD;  Location: [place];  Service: Orthopedics;  Laterality: Left;  . rotator cuffr Right   . SHOULDER ARTHROSCOPY    . thyroid nodule removed  1970  . TONSILLECTOMY  age 4  . WRIST FRACTURE SURGERY     Social History   Occupational History  . Not on file  Tobacco Use  . Smoking status: Never Smoker  . Smokeless tobacco: Never Used  Substance and Sexual Activity  . Alcohol use: Yes    Comment: occasional  . Drug use: No  . Sexual activity: Not on file

## 2018-04-07 NOTE — Telephone Encounter (Signed)
Agree with 40

## 2018-04-08 ENCOUNTER — Telehealth (INDEPENDENT_AMBULATORY_CARE_PROVIDER_SITE_OTHER): Payer: Self-pay | Admitting: Orthopaedic Surgery

## 2018-04-08 NOTE — Telephone Encounter (Signed)
Patient left a voicemail requesting a prescription refill of Hydrocodone.  Patient's pharmacy is [facility] [address] in [place].

## 2018-04-09 ENCOUNTER — Telehealth (INDEPENDENT_AMBULATORY_CARE_PROVIDER_SITE_OTHER): Payer: Self-pay | Admitting: Orthopaedic Surgery

## 2018-04-09 NOTE — Telephone Encounter (Signed)
Patient called requesting prescription refill of Hydrocodone and Flexeril.  Patient states she changed her pharmacy and would like refills to be [facility] to [facility] [address].

## 2018-04-10 ENCOUNTER — Telehealth (INDEPENDENT_AMBULATORY_CARE_PROVIDER_SITE_OTHER): Payer: Self-pay | Admitting: Orthopaedic Surgery

## 2018-04-10 ENCOUNTER — Other Ambulatory Visit (INDEPENDENT_AMBULATORY_CARE_PROVIDER_SITE_OTHER): Payer: Self-pay | Admitting: Radiology

## 2018-04-10 ENCOUNTER — Other Ambulatory Visit (INDEPENDENT_AMBULATORY_CARE_PROVIDER_SITE_OTHER): Payer: Self-pay | Admitting: Orthopedic Surgery

## 2018-04-10 MED ORDER — CYCLOBENZAPRINE HCL 5 MG PO TABS: 5.0000 mg | ORAL_TABLET | Freq: Three times a day (TID) | ORAL | 0 refills | Status: DC | PRN

## 2018-04-10 MED ORDER — HYDROCODONE-ACETAMINOPHEN 5-325 MG PO TABS: 1.0000 | ORAL_TABLET | ORAL | 0 refills | Status: DC | PRN

## 2018-04-10 NOTE — Telephone Encounter (Signed)
Please advise 

## 2018-04-10 NOTE — Telephone Encounter (Signed)
Got Aaron Edelman to send refills in through the computer

## 2018-04-10 NOTE — Telephone Encounter (Signed)
Ok to refill both?? 

## 2018-04-10 NOTE — Telephone Encounter (Signed)
Aaron Edelman called pt meds in.

## 2018-04-10 NOTE — Telephone Encounter (Signed)
ok 

## 2018-04-10 NOTE — Telephone Encounter (Signed)
Patient called stating that she only has 3 pills remaining of her Flexeril and is hoping that the prescription for both Flexeril and Hydrocodone could be called into the pharmacy today.  [facility] is [facility] [address].

## 2018-04-11 DIAGNOSIS — F419 Anxiety disorder, unspecified: Secondary | ICD-10-CM | POA: Diagnosis not present

## 2018-04-11 DIAGNOSIS — S82091D Other fracture of right patella, subsequent encounter for closed fracture with routine healing: Secondary | ICD-10-CM | POA: Diagnosis not present

## 2018-04-11 DIAGNOSIS — E039 Hypothyroidism, unspecified: Secondary | ICD-10-CM | POA: Diagnosis not present

## 2018-04-11 DIAGNOSIS — K219 Gastro-esophageal reflux disease without esophagitis: Secondary | ICD-10-CM | POA: Diagnosis not present

## 2018-04-11 DIAGNOSIS — W19XXXD Unspecified fall, subsequent encounter: Secondary | ICD-10-CM | POA: Diagnosis not present

## 2018-04-11 DIAGNOSIS — K529 Noninfective gastroenteritis and colitis, unspecified: Secondary | ICD-10-CM | POA: Diagnosis not present

## 2018-04-14 ENCOUNTER — Encounter (INDEPENDENT_AMBULATORY_CARE_PROVIDER_SITE_OTHER): Payer: Self-pay | Admitting: Orthopaedic Surgery

## 2018-04-14 ENCOUNTER — Telehealth: Payer: Self-pay | Admitting: Rheumatology

## 2018-04-14 ENCOUNTER — Ambulatory Visit (INDEPENDENT_AMBULATORY_CARE_PROVIDER_SITE_OTHER): Payer: PPO | Admitting: Orthopaedic Surgery

## 2018-04-14 ENCOUNTER — Other Ambulatory Visit (INDEPENDENT_AMBULATORY_CARE_PROVIDER_SITE_OTHER): Payer: Self-pay | Admitting: Radiology

## 2018-04-14 ENCOUNTER — Inpatient Hospital Stay (INDEPENDENT_AMBULATORY_CARE_PROVIDER_SITE_OTHER): Payer: PPO | Admitting: Orthopaedic Surgery

## 2018-04-14 VITALS — BP 118/67 | HR 79 | Resp 18 | Ht 63.0 in | Wt 177.0 lb

## 2018-04-14 DIAGNOSIS — S82001D Unspecified fracture of right patella, subsequent encounter for closed fracture with routine healing: Secondary | ICD-10-CM

## 2018-04-14 NOTE — Telephone Encounter (Signed)
Kylie Christensen from [facility] on [address] called stating they need a prior authorization before they will be able to fill the prescription of Flexeril.  Kylie Christensen stated that the number to call with prior authorization is [phone]

## 2018-04-14 NOTE — Progress Notes (Signed)
Office Visit Note   Patient: Kylie Christensen           Date of Birth: May 08, 1941           MRN: [id number] Visit Date: 04/14/2018              Requested by: Josetta Huddle, MD [address] [place], [place] [address] PCP: Josetta Huddle, MD   Assessment & Plan: Visit Diagnoses:  1. Closed nondisplaced fracture of right patella with routine healing, unspecified fracture morphology, subsequent encounter     Plan: 11 days status post ORIF of a displaced comminuted right patella fracture.  Doing well.  Received first therapy this past Friday.  Staples removed from right knee and Steri-Strips applied.  Continue with touchdown or full weightbearing as tolerated in the knee immobilizer.  Office 2 weeks  Follow-Up Instructions: Return in about 2 weeks (around 04/28/2018).   Orders:  No orders of the defined types were placed in this encounter.  No orders of the defined types were placed in this encounter.     Procedures: No procedures performed   Clinical Data: No additional findings.   Subjective: No chief complaint on file. Kylie Christensen is 11 days status post ORIF of comminuted displaced right patella fracture and doing well.  No fever or chills.  Taking Flexeril for pain and minimal pain meds.  Related complications  HPI  Review of Systems   Objective: Vital Signs: There were no vitals taken for this visit.  Physical Exam  Ortho Exam awake alert and oriented x3.  Comfortable sitting.  Incision right knee is healing without problem.  Clips removed and Steri-Strips applied.  No calf pain.  Minimal swelling of her ankle.  Specialty Comments:  No specialty comments available.  Imaging: No results found.   PMFS History: Patient Active Problem List   Diagnosis Date Noted  . Closed displaced comminuted fracture of right patella 03/31/2018  . Distal radius fracture, left 01/21/2016   Past Medical History:  Diagnosis Date  . Anxiety   . Colitis   .  GERD (gastroesophageal reflux disease)   . Headache   . Hypothyroidism     History reviewed. No pertinent family history.  Past Surgical History:  Procedure Laterality Date  . ABDOMINAL HYSTERECTOMY     partial  . BACK SURGERY  nov 2011   lower back  . BALLOON DILATION N/A 01/25/2015   Procedure: BALLOON DILATION;  Surgeon: Garlan Fair, MD;  Location: [facility];  Service: Endoscopy;  Laterality: N/A;  . BIOPSY BREAST Left yrs ago   benign  . COLONOSCOPY WITH PROPOFOL N/A 01/25/2015   Procedure: COLONOSCOPY WITH PROPOFOL;  Surgeon: Garlan Fair, MD;  [facility];  Service: Endoscopy;  Laterality: N/A;  . ESOPHAGOGASTRODUODENOSCOPY (EGD) WITH PROPOFOL N/A 01/25/2015   Procedure: ESOPHAGOGASTRODUODENOSCOPY (EGD) WITH PROPOFOL;  Surgeon: Garlan Fair, MD;  [facility];  Service: Endoscopy;  Laterality: N/A;  . KNEE SURGERY    . OPEN REDUCTION INTERNAL FIXATION (ORIF) DISTAL RADIAL FRACTURE Left 01/21/2016   Procedure: OPEN REDUCTION INTERNAL FIXATION (ORIF) DISTAL RADIAL FRACTURE;  Surgeon: Iran Planas, MD;  Location: [place];  Service: Orthopedics;  Laterality: Left;  . rotator cuffr Right   . SHOULDER ARTHROSCOPY    . thyroid nodule removed  1970  . TONSILLECTOMY  age 50  . WRIST FRACTURE SURGERY     Social History   Occupational History  . Not on file  Tobacco Use  .  Smoking status: Never Smoker  . Smokeless tobacco: Never Used  Substance and Sexual Activity  . Alcohol use: Yes    Comment: occasional  . Drug use: No  . Sexual activity: Not on file     Garald Balding, MD   Note - This record has been created using [employer].  Chart creation errors have been sought, but may not always  have been located. Such creation errors do not reflect on  the standard of medical care.

## 2018-04-15 NOTE — Telephone Encounter (Signed)
Called and got med approved called pharmacy and pt to notify.

## 2018-04-28 ENCOUNTER — Ambulatory Visit (INDEPENDENT_AMBULATORY_CARE_PROVIDER_SITE_OTHER): Payer: PPO | Admitting: Orthopaedic Surgery

## 2018-04-29 ENCOUNTER — Ambulatory Visit (INDEPENDENT_AMBULATORY_CARE_PROVIDER_SITE_OTHER): Payer: PPO

## 2018-04-29 ENCOUNTER — Encounter (INDEPENDENT_AMBULATORY_CARE_PROVIDER_SITE_OTHER): Payer: Self-pay | Admitting: Orthopaedic Surgery

## 2018-04-29 ENCOUNTER — Ambulatory Visit (INDEPENDENT_AMBULATORY_CARE_PROVIDER_SITE_OTHER): Payer: PPO | Admitting: Orthopaedic Surgery

## 2018-04-29 VITALS — BP 103/77 | HR 86 | Resp 20 | Ht 63.0 in | Wt 177.0 lb

## 2018-04-29 DIAGNOSIS — M25561 Pain in right knee: Secondary | ICD-10-CM | POA: Diagnosis not present

## 2018-04-29 DIAGNOSIS — S82041D Displaced comminuted fracture of right patella, subsequent encounter for closed fracture with routine healing: Secondary | ICD-10-CM

## 2018-04-29 DIAGNOSIS — G8929 Other chronic pain: Secondary | ICD-10-CM

## 2018-04-29 MED ORDER — CYCLOBENZAPRINE HCL 5 MG PO TABS: 5.0000 mg | ORAL_TABLET | Freq: Three times a day (TID) | ORAL | 0 refills | Status: DC | PRN

## 2018-04-29 NOTE — Progress Notes (Signed)
Office Visit Note   Patient: Kylie Christensen           Date of Birth: January 23, 1941           MRN: [id number] Visit Date: 04/29/2018              Requested by: Josetta Huddle, MD [address] [place], [place] [address] PCP: Josetta Huddle, MD   Assessment & Plan: Visit Diagnoses:  1. Chronic pain of right knee   2. Closed displaced comminuted fracture of right patella with routine healing, subsequent encounter     Plan: 1 month status post open reduction internal fixation of a comminuted right patella fracture.  Doing well.  Still using crutches and a knee immobilizer.  Receiving physical therapy at [facility] where she [facility].  Some spasm at night.  Will renew Flexeril.  [facility] [address] or at friend's home.  Office 2 weeks.  Discontinue knee immobilizer  Follow-Up Instructions: Return in about 2 weeks (around 05/13/2018).   Orders:  Orders Placed This Encounter  Procedures  . XR KNEE 3 VIEW RIGHT  . Ambulatory referral to Physical Therapy   Meds ordered this encounter  Medications  . cyclobenzaprine (FLEXERIL) 5 MG tablet    Sig: Take 1 tablet (5 mg total) by mouth 3 (three) times daily as needed for muscle spasms.    Dispense:  30 tablet    Refill:  0      Procedures: No procedures performed   Clinical Data: No additional findings.   Subjective: Chief Complaint  Patient presents with  . Right Knee - Pain, Routine Post Op  . Follow-up    R KNEE POST OP 04/03/18, DOING OK WITH PT THIS WEEK WAS A LIL TOUGHER    HPI  Review of Systems  Constitutional: Positive for fatigue. Negative for fever.  HENT: Negative for ear pain.   Eyes: Negative for pain.  Respiratory: Negative for cough and shortness of breath.   Cardiovascular: Negative for leg swelling.  Gastrointestinal: Negative for constipation and diarrhea.  Genitourinary: Negative for difficulty urinating.  Musculoskeletal: Negative for back pain  and neck pain.  Skin: Negative for rash.  Allergic/Immunologic: Negative for food allergies.  Neurological: Positive for weakness. Negative for numbness.  Hematological: Bruises/bleeds easily.  Psychiatric/Behavioral: Positive for sleep disturbance.     Objective: Vital Signs: BP 103/77 (BP Location: Left Arm, Patient Position: Sitting, Cuff Size: Normal)   Pulse 86   Resp 20   Ht 5\' 3"  (1.6 m)   Wt 177 lb (80.3 kg)   BMI 31.35 kg/m   Physical Exam  Ortho Exam awake alert and oriented x3.  Comfortable sitting.  Little tired after her visit to the office.  She is using 2 crutches and the knee immobilizer.  Edema allows her was removed.  Wound is healing without any problem right knee.  No effusion.  Full extension.  There is a little bit of an extensor lag.  Flexion about 50 degrees.  No instability.  No calf pain.  Neurovascular exam intact.  Specialty Comments:  No specialty comments available.  Imaging: Xr Knee 3 View Right  Result Date: 04/29/2018 Films of the right patella were obtained in several projections.  The internal fixation screws are well aligned.  There is about a millimeter separation of the fragments are in good position.  Might have slight offset of the distal fragments from the proximal fragments of may be a  millimeter or 2.  No complications of internal fixation    PMFS History: Patient Active Problem List   Diagnosis Date Noted  . Closed displaced comminuted fracture of right patella 03/31/2018  . Distal radius fracture, left 01/21/2016   Past Medical History:  Diagnosis Date  . Anxiety   . Colitis   . GERD (gastroesophageal reflux disease)   . Headache   . Hypothyroidism     History reviewed. No pertinent family history.  Past Surgical History:  Procedure Laterality Date  . ABDOMINAL HYSTERECTOMY     partial  . BACK SURGERY  nov 2011   lower back  . BALLOON DILATION N/A 01/25/2015   Procedure: BALLOON DILATION;  Surgeon: Garlan Fair, MD;   Location: [facility];  Service: Endoscopy;  Laterality: N/A;  . BIOPSY BREAST Left yrs ago   benign  . COLONOSCOPY WITH PROPOFOL N/A 01/25/2015   Procedure: COLONOSCOPY WITH PROPOFOL;  Surgeon: Garlan Fair, MD;  [facility];  Service: Endoscopy;  Laterality: N/A;  . ESOPHAGOGASTRODUODENOSCOPY (EGD) WITH PROPOFOL N/A 01/25/2015   Procedure: ESOPHAGOGASTRODUODENOSCOPY (EGD) WITH PROPOFOL;  Surgeon: Garlan Fair, MD;  [facility];  Service: Endoscopy;  Laterality: N/A;  . KNEE SURGERY    . OPEN REDUCTION INTERNAL FIXATION (ORIF) DISTAL RADIAL FRACTURE Left 01/21/2016   Procedure: OPEN REDUCTION INTERNAL FIXATION (ORIF) DISTAL RADIAL FRACTURE;  Surgeon: Iran Planas, MD;  Location: [place];  Service: Orthopedics;  Laterality: Left;  . rotator cuffr Right   . SHOULDER ARTHROSCOPY    . thyroid nodule removed  1970  . TONSILLECTOMY  age 42  . WRIST FRACTURE SURGERY     Social History   Occupational History  . Not on file  Tobacco Use  . Smoking status: Never Smoker  . Smokeless tobacco: Current User    Types: Chew  Substance and Sexual Activity  . Alcohol use: Yes    Comment: occasional  . Drug use: No  . Sexual activity: Not on file

## 2018-05-15 ENCOUNTER — Ambulatory Visit: Payer: PPO | Attending: Orthopaedic Surgery

## 2018-05-15 ENCOUNTER — Other Ambulatory Visit: Payer: Self-pay

## 2018-05-15 DIAGNOSIS — M6281 Muscle weakness (generalized): Secondary | ICD-10-CM | POA: Diagnosis not present

## 2018-05-15 DIAGNOSIS — M25561 Pain in right knee: Secondary | ICD-10-CM | POA: Insufficient documentation

## 2018-05-15 DIAGNOSIS — G8929 Other chronic pain: Secondary | ICD-10-CM | POA: Insufficient documentation

## 2018-05-15 DIAGNOSIS — R2689 Other abnormalities of gait and mobility: Secondary | ICD-10-CM

## 2018-05-15 DIAGNOSIS — R600 Localized edema: Secondary | ICD-10-CM | POA: Insufficient documentation

## 2018-05-15 DIAGNOSIS — M25661 Stiffness of right knee, not elsewhere classified: Secondary | ICD-10-CM | POA: Diagnosis not present

## 2018-05-15 NOTE — Therapy (Signed)
[facility] [address], [place], [place], [address] Phone: [phone]   Fax:  [phone]  Physical Therapy Evaluation  Patient Details  Name: Kylie Christensen MRN: [id number] Date of Birth: 1941/05/11 Referring Provider: Joni Fears, MD   Encounter Date: 05/15/2018  PT End of Session - 05/15/18 1142    Visit Number  1    Authorization Type  Medicare    PT Start Time  1324    PT Stop Time  1203    PT Time Calculation (min)  58 min    Activity Tolerance  Patient tolerated treatment well    Behavior During Therapy  [facility] for tasks assessed/performed       Past Medical History:  Diagnosis Date  . Anxiety   . Colitis   . GERD (gastroesophageal reflux disease)   . Headache   . Hypothyroidism     Past Surgical History:  Procedure Laterality Date  . ABDOMINAL HYSTERECTOMY     partial  . BACK SURGERY  nov 2011   lower back  . BALLOON DILATION N/A 01/25/2015   Procedure: BALLOON DILATION;  Surgeon: Garlan Fair, MD;  Location: [facility];  Service: Endoscopy;  Laterality: N/A;  . BIOPSY BREAST Left yrs ago   benign  . COLONOSCOPY WITH PROPOFOL N/A 01/25/2015   Procedure: COLONOSCOPY WITH PROPOFOL;  Surgeon: Garlan Fair, MD;  Location: [facility];  Service: Endoscopy;  Laterality: N/A;  . ESOPHAGOGASTRODUODENOSCOPY (EGD) WITH PROPOFOL N/A 01/25/2015   Procedure: ESOPHAGOGASTRODUODENOSCOPY (EGD) WITH PROPOFOL;  Surgeon: Garlan Fair, MD;  Location: [facility];  Service: Endoscopy;  Laterality: N/A;  . KNEE SURGERY    . OPEN REDUCTION INTERNAL FIXATION (ORIF) DISTAL RADIAL FRACTURE Left 01/21/2016   Procedure: OPEN REDUCTION INTERNAL FIXATION (ORIF) DISTAL RADIAL FRACTURE;  Surgeon: Iran Planas, MD;  Location: [facility];  Service: Orthopedics;  Laterality: Left;  . rotator cuffr Right   . SHOULDER ARTHROSCOPY    . thyroid nodule removed  1970  . TONSILLECTOMY  age 25  . WRIST FRACTURE SURGERY       There were no vitals filed for this visit.   Subjective Assessment - 05/15/18 1110    Subjective  Pt presents to PT s/p Rt patellar fracture with ORIF.  Injury sustained 03/29/18 due to fall and surgery performed 04/03/18.  Pt had home health PT that ended yesterday.  Pt is ambulating with 2 crutches.    Pertinent History  Rt patellar fracture: ORIF surgery 04/03/18    Limitations  Standing;Walking    How long can you stand comfortably?  Lt>Rt weightbearing: 10 minutes    How long can you walk comfortably?  10 minutes with crutches    Patient Stated Goals  wean from crutches, improve gait, improve strength and endurance    Currently in Pain?  Yes    Pain Score  3     Pain Location  Knee    Pain Orientation  Right    Pain Descriptors / Indicators  Tender;Sore    Pain Onset  More than a month ago    Pain Frequency  Constant    Aggravating Factors   standing and walking, bending the knee    Pain Relieving Factors  rubbing the knee, elevate, ice         [facility] PT Assessment - 05/15/18 0001      Assessment   Medical Diagnosis  chronic pain of Rt knee. S/p patellar ORIF 04/03/18    Referring  Provider  Joni Fears, MD    Onset Date/Surgical Date  03/29/18 04/03/18 surgery     Next MD Visit  05/19/18    Prior Therapy  none      Precautions   Precautions  Fall    Precaution Comments  pt had a fall that resulted in patellar fracture      Restrictions   Weight Bearing Restrictions  No      Balance Screen   Has the patient fallen in the past 6 months  Yes    How many times?  1 resulted in injury    Has the patient had a decrease in activity level because of a fear of falling?   No    Is the patient reluctant to leave their home because of a fear of falling?   No      Home Environment   Living Environment  Assisted living    Living Arrangements  Spouse/significant other    Type of [place]  Crutches;Cane - single point;Shower seat - built in;Grab bars - tub/shower;Grab bars - toilet      Prior Function   Level of Independence  Independent with basic ADLs;Independent with transfers;Independent with gait;Requires assistive device for independence    Vocation  Retired    Leisure  none      Cognition   Overall Cognitive Status  Within Functional Limits for tasks assessed      Observation/Other Assessments   Focus on Therapeutic Outcomes (FOTO)   57% limitation      Circumferential Edema   Circumferential - Right  17 inches    Circumferential - Left   16 inches      Posture/Postural Control   Posture/Postural Control  No significant limitations      ROM / Strength   AROM / PROM / Strength  AROM;PROM;Strength      AROM   Overall AROM   Deficits    AROM Assessment Site  Knee    Right/Left Knee  Right;Left    Right Knee Extension  -6    Right Knee Flexion  70    Left Knee Extension  0    Left Knee Flexion  120      PROM   Overall PROM   Deficits    PROM Assessment Site  Knee    Right/Left Knee  Left    Left Knee Extension  -5    Left Knee Flexion  72      Strength   Overall Strength  Deficits    Overall Strength Comments  Lt hip and knee 4+/5, Rt hip 4/5    Strength Assessment Site  Knee    Right/Left Knee  Left;Right    Right Knee Flexion  4-/5    Right Knee Extension  4-/5      Palpation   Patella mobility  reduced patellar mobility on the Rt in all directions    Palpation comment  well healing surgical incision       Transfers   Transfers  Sit to Stand;Stand to Sit    Sit to Stand  With upper extremity assist    Stand to Sit  With upper extremity assist      Ambulation/Gait   Ambulation/Gait  Yes    Ambulation/Gait Assistance  6: Modified independent (Device/Increase time)    Assistive device  Crutches  Gait Pattern  Step-through pattern;Decreased stride length;Decreased stance time - right                Objective  measurements completed on examination: See above findings.      [place] Adult PT Treatment/Exercise - 05/15/18 0001      Exercises   Exercises  Knee/Hip      Knee/Hip Exercises: Aerobic   Nustep  Level 2x 5 minutes instructed to try to do at [place]   Number Minutes Vasopneumatic   15 minutes    Vasopnuematic Location   Knee    Vasopneumatic Pressure  Medium    Vasopneumatic Temperature   3 snowflakes             PT Education - 05/15/18 1301    Education Details  verbal review of HEP issued by home health    Person(s) Educated  Patient    Methods  Explanation    Comprehension  Verbalized understanding       PT Short Term Goals - 05/15/18 1240      PT SHORT TERM GOAL #1   Title  be independent in initial HEP    Time  4    Period  Weeks    Status  New    Target Date  06/12/18      PT SHORT TERM GOAL #2   Title  improve Rt LE strength to wean to 1 crutch for all distances    Time  4    Period  Weeks    Status  New    Target Date  06/12/18      PT SHORT TERM GOAL #3   Title  demonstrate Rt knee A/ROM flexion to > or = to 95 degrees to allow for sitting without limitation     Time  4    Period  Weeks    Status  New    Target Date  06/12/18      PT SHORT TERM GOAL #4   Title  reduce Rt knee ambulate for > or = to 20 minutes in the community to improve independence    Time  4    Period  Weeks    Status  New    Target Date  06/12/18        PT Long Term Goals - 05/15/18 1245      PT LONG TERM GOAL #1   Title  be independent in advanced HEP    Time  8    Period  Weeks    Status  New    Target Date  07/10/18      PT LONG TERM GOAL #2   Title  wean from crutches and demonstrate symmetry with ambulation on level surfaces    Time  8    Period  Weeks    Status  New    Target Date  07/10/18      PT LONG TERM GOAL #3   Title  demonstrate Rt knee A/ROM flexion to > or = to 110  degrees to improve squatting, negotiating steps and driving    Time  8    Period  Weeks    Status  New    Target Date  07/10/18      PT LONG TERM GOAL #4   Title  reduce FOTO to < or = to 48% limitation    Time  8    Period  Weeks    Status  New    Target Date  07/10/18      PT LONG TERM GOAL #5   Title  improve Rt LE strength to walk for > or = to 30 minutes without limitation    Time  8    Period  Weeks    Status  New    Target Date  07/10/18             Plan - 05/15/18 1256    Clinical Impression Statement  Pt presents to PT ~6 weeks s/p Rt patellar fracture with ORIF.  Pt had home health PT until yesterday.  Pt ambulates with bil crutches, demonstrates reduced Rt knee A/ROM and strength, edema and reduced patellar mobility.  Pt will benefit from skilled PT for Rt knee A/ROM, strength, gait and edema management to return to prior level of function.      History and Personal Factors relevant to plan of care:  Lt patellar fracture 3 years ago    Clinical Presentation  Stable    Clinical Presentation due to:  expected progress s/p ORIF surgery    Clinical Decision Making  Low    Rehab Potential  Good    PT Frequency  3x / week    PT Duration  8 weeks    PT Treatment/Interventions  ADLs/Self Care Home Management;Cryotherapy;Electrical Stimulation;Moist Heat;Gait training;Stair training;Functional mobility training;Therapeutic activities;Therapeutic exercise;Neuromuscular re-education;Patient/family education;Passive range of motion;Scar mobilization;Manual techniques;Taping;Vasopneumatic Device    PT Next Visit Plan  Rt knee A/ROM, NuStep, strength, manual for ROM gains, Game Ready, gait    Consulted and Agree with Plan of Care  Patient       Patient will benefit from skilled therapeutic intervention in order to improve the following deficits and impairments:  Abnormal gait, Pain, Decreased scar mobility, Decreased strength, Decreased range of motion, Decreased endurance,  Decreased activity tolerance, Increased edema, Impaired flexibility, Difficulty walking  Visit Diagnosis: Chronic pain of right knee - Plan: PT plan of care cert/re-cert  Stiffness of right knee, not elsewhere classified - Plan: PT plan of care cert/re-cert  Other abnormalities of gait and mobility - Plan: PT plan of care cert/re-cert  Muscle weakness (generalized) - Plan: PT plan of care cert/re-cert  Localized edema - Plan: PT plan of care cert/re-cert     Problem List Patient Active Problem List   Diagnosis Date Noted  . Closed displaced comminuted fracture of right patella 03/31/2018  . Distal radius fracture, left 01/21/2016     Sigurd Sos, PT 05/15/18 1:07 PM  [facility] [address] [place], [place], [address] Phone: [phone]   Fax:  [phone]  Name: Kylie Christensen MRN: [id number] Date of Birth: 09/06/1941

## 2018-05-19 ENCOUNTER — Ambulatory Visit (INDEPENDENT_AMBULATORY_CARE_PROVIDER_SITE_OTHER): Payer: PPO | Admitting: Orthopaedic Surgery

## 2018-05-19 ENCOUNTER — Encounter: Payer: Self-pay | Admitting: Physical Therapy

## 2018-05-19 ENCOUNTER — Encounter

## 2018-05-19 ENCOUNTER — Encounter (INDEPENDENT_AMBULATORY_CARE_PROVIDER_SITE_OTHER): Payer: Self-pay | Admitting: Orthopaedic Surgery

## 2018-05-19 ENCOUNTER — Ambulatory Visit: Payer: PPO | Attending: Orthopaedic Surgery | Admitting: Physical Therapy

## 2018-05-19 VITALS — BP 110/72 | HR 79 | Ht 63.0 in | Wt 177.0 lb

## 2018-05-19 DIAGNOSIS — R2689 Other abnormalities of gait and mobility: Secondary | ICD-10-CM | POA: Insufficient documentation

## 2018-05-19 DIAGNOSIS — M6281 Muscle weakness (generalized): Secondary | ICD-10-CM | POA: Diagnosis not present

## 2018-05-19 DIAGNOSIS — G8929 Other chronic pain: Secondary | ICD-10-CM | POA: Insufficient documentation

## 2018-05-19 DIAGNOSIS — M25561 Pain in right knee: Secondary | ICD-10-CM | POA: Diagnosis not present

## 2018-05-19 DIAGNOSIS — R600 Localized edema: Secondary | ICD-10-CM | POA: Insufficient documentation

## 2018-05-19 DIAGNOSIS — S82041D Displaced comminuted fracture of right patella, subsequent encounter for closed fracture with routine healing: Secondary | ICD-10-CM

## 2018-05-19 DIAGNOSIS — M25661 Stiffness of right knee, not elsewhere classified: Secondary | ICD-10-CM | POA: Insufficient documentation

## 2018-05-19 MED ORDER — HYDROCODONE-ACETAMINOPHEN 5-325 MG PO TABS: 1.0000 | ORAL_TABLET | ORAL | 0 refills | Status: DC | PRN

## 2018-05-19 MED ORDER — DICLOFENAC SODIUM 1 % TD GEL: 4.0000 g | Freq: Four times a day (QID) | TRANSDERMAL | 4 refills | Status: DC

## 2018-05-19 NOTE — Progress Notes (Signed)
Office Visit Note   Patient: Kylie Christensen           Date of Birth: 03/10/1941           MRN: [id number] Visit Date: 05/19/2018              Requested by: Josetta Huddle, MD [address] [place] [place], [place] [address] PCP: Josetta Huddle, MD   Assessment & Plan: Visit Diagnoses:  1. Closed displaced comminuted fracture of right patella with routine healing, subsequent encounter     Plan: 6 weeks status post ORIF right patella fracture.  Doing well.  Continue with weightbearing as tolerated and physical therapy.  Discontinue crutches when comfortable.  Office 1 month  Follow-Up Instructions: Return in about 1 month (around 06/18/2018).   Orders:  No orders of the defined types were placed in this encounter.  Meds ordered this encounter  Medications  . diclofenac sodium (VOLTAREN) 1 % GEL    Sig: Apply 4 g topically 4 (four) times daily. Apply to large joint as needed.    Dispense:  3 Tube    Refill:  4  . HYDROcodone-acetaminophen (NORCO/VICODIN) 5-325 MG tablet    Sig: Take 1 tablet by mouth every 4 (four) hours as needed for moderate pain.    Dispense:  30 tablet    Refill:  0      Procedures: No procedures performed   Clinical Data: No additional findings.   Subjective: Chief Complaint  Patient presents with  . Right Knee - Pain  . Follow-up    04/03/18 R KNEE SURGERY, HAVING SWELLING, PAIN     HPI  Review of Systems  Constitutional: Positive for fatigue.  HENT: Negative for ear pain.   Eyes: Negative for pain.  Respiratory: Negative for cough and shortness of breath.   Cardiovascular: Positive for leg swelling.  Gastrointestinal: Positive for diarrhea. Negative for constipation.  Genitourinary: Negative for difficulty urinating.  Musculoskeletal: Negative for back pain and neck pain.  Skin: Negative for rash.  Allergic/Immunologic: Negative for food allergies.  Neurological: Positive for weakness. Negative for numbness.  Hematological:  Bruises/bleeds easily.  Psychiatric/Behavioral: Positive for sleep disturbance.     Objective: Vital Signs: BP 110/72 (BP Location: Left Arm, Patient Position: Sitting, Cuff Size: Normal)   Pulse 79   Ht 5\' 3"  (1.6 m)   Wt 177 lb (80.3 kg)   BMI 31.35 kg/m   Physical Exam  Ortho Exam awake alert and oriented x3 comfortable sitting.  Accompanied by her husband.  Right knee incision healing without problem.  I am able to fully extend the knee and she can maintain apposition.  She flexed over 100 degrees.  No instability.  Minimal tenderness about the patella.  Skin looks just fine.  No obvious effusion.  Vascular exam intact Specialty Comments:  No specialty comments available.  Imaging: No results found.   PMFS History: Patient Active Problem List   Diagnosis Date Noted  . Closed displaced comminuted fracture of right patella 03/31/2018  . Distal radius fracture, left 01/21/2016   Past Medical History:  Diagnosis Date  . Anxiety   . Colitis   . GERD (gastroesophageal reflux disease)   . Headache   . Hypothyroidism     History reviewed. No pertinent family history.  Past Surgical History:  Procedure Laterality Date  . ABDOMINAL HYSTERECTOMY     partial  . BACK SURGERY  nov 2011   lower back  . BALLOON DILATION N/A 01/25/2015  Procedure: BALLOON DILATION;  Surgeon: Garlan Fair, MD;  Location: [facility];  Service: Endoscopy;  Laterality: N/A;  . BIOPSY BREAST Left yrs ago   benign  . COLONOSCOPY WITH PROPOFOL N/A 01/25/2015   Procedure: COLONOSCOPY WITH PROPOFOL;  Surgeon: Garlan Fair, MD;  [facility]: [facility];  Service: Endoscopy;  Laterality: N/A;  . ESOPHAGOGASTRODUODENOSCOPY (EGD) WITH PROPOFOL N/A 01/25/2015   Procedure: ESOPHAGOGASTRODUODENOSCOPY (EGD) WITH PROPOFOL;  Surgeon: Garlan Fair, MD;  Location: [facility];  Service: Endoscopy;  Laterality: N/A;  . KNEE SURGERY    . OPEN REDUCTION INTERNAL FIXATION (ORIF) DISTAL RADIAL FRACTURE Left  01/21/2016   Procedure: OPEN REDUCTION INTERNAL FIXATION (ORIF) DISTAL RADIAL FRACTURE;  Surgeon: Iran Planas, MD;  Location: [facility];  Service: Orthopedics;  Laterality: Left;  . rotator cuffr Right   . SHOULDER ARTHROSCOPY    . thyroid nodule removed  1970  . TONSILLECTOMY  age 56  . WRIST FRACTURE SURGERY     Social History   Occupational History  . Not on file  Tobacco Use  . Smoking status: Never Smoker  . Smokeless tobacco: Current User    Types: Chew  Substance and Sexual Activity  . Alcohol use: Yes    Comment: occasional  . Drug use: No  . Sexual activity: Not on file

## 2018-05-19 NOTE — Therapy (Signed)
[facility] [address], [place], [place], [address] Phone: [phone]   Fax:  [phone]  Physical Therapy Treatment  Patient Details  Name: Kylie Christensen MRN: [id number] Date of Birth: 02/22/1941 Referring Provider: Joni Fears, MD   Encounter Date: 05/19/2018  PT End of Session - 05/19/18 1409    Visit Number  2    Authorization Type  Medicare    PT Start Time  1408  8 min late    PT Stop Time  1500    PT Time Calculation (min)  52 min    Activity Tolerance  Patient tolerated treatment well    Behavior During Therapy  [facility] for tasks assessed/performed       Past Medical History:  Diagnosis Date  . Anxiety   . Colitis   . GERD (gastroesophageal reflux disease)   . Headache   . Hypothyroidism     Past Surgical History:  Procedure Laterality Date  . ABDOMINAL HYSTERECTOMY     partial  . BACK SURGERY  nov 2011   lower back  . BALLOON DILATION N/A 01/25/2015   Procedure: BALLOON DILATION;  Surgeon: Garlan Fair, MD;  Location: [facility];  Service: Endoscopy;  Laterality: N/A;  . BIOPSY BREAST Left yrs ago   benign  . COLONOSCOPY WITH PROPOFOL N/A 01/25/2015   Procedure: COLONOSCOPY WITH PROPOFOL;  Surgeon: Garlan Fair, MD;  [facility];  Service: Endoscopy;  Laterality: N/A;  . ESOPHAGOGASTRODUODENOSCOPY (EGD) WITH PROPOFOL N/A 01/25/2015   Procedure: ESOPHAGOGASTRODUODENOSCOPY (EGD) WITH PROPOFOL;  Surgeon: Garlan Fair, MD;  [facility];  Service: Endoscopy;  Laterality: N/A;  . KNEE SURGERY    . OPEN REDUCTION INTERNAL FIXATION (ORIF) DISTAL RADIAL FRACTURE Left 01/21/2016   Procedure: OPEN REDUCTION INTERNAL FIXATION (ORIF) DISTAL RADIAL FRACTURE;  Surgeon: Iran Planas, MD;  Location: [facility];  Service: Orthopedics;  Laterality: Left;  . rotator cuffr Right   . SHOULDER ARTHROSCOPY    . thyroid nodule removed  1970  . TONSILLECTOMY  age 77  . WRIST FRACTURE  SURGERY      There were no vitals filed for this visit.  Subjective Assessment - 05/19/18 1410    Subjective  Saw MD this AM. Got good report, fx healing. Pt ambulates inot clinic with one crutch today.     Pertinent History  Rt patellar fracture: ORIF surgery 04/03/18    Currently in Pain?  Yes    Pain Score  4     Pain Location  Knee    Pain Descriptors / Indicators  Sore    Multiple Pain Sites  No         [facility] PT Assessment - 05/19/18 0001      AROM   Right Knee Flexion  95                   [facility] Adult PT Treatment/Exercise - 05/19/18 0001      Ambulation/Gait   Assistive device  -- 1 axillary crutch, level surface 40 ft 2x    Gait Comments  VC for push off/finidng great toe for push off      Knee/Hip Exercises: Stretches   Knee: Self-Stretch to increase Flexion  -- With towel for asst 10x    Gastroc Stretch  Right;1 rep;30 seconds      Knee/Hip Exercises: Aerobic   Nustep  L2x 7 min      Knee/Hip Exercises: Seated   Long [employer]  AROM;Strengthening;Right;1 set;10 reps      Vasopneumatic   Number Minutes Vasopneumatic   15 minutes    Vasopnuematic Location   Knee    Vasopneumatic Pressure  Medium    Vasopneumatic Temperature   3 snowflakes      Manual Therapy   Manual Therapy  Joint mobilization    Joint Mobilization  Patella mobs in 4 directions             PT Education - 05/19/18 1432    Education Details  Gastroc stretch for HEP    Person(s) Educated  Patient    Methods  Explanation;Demonstration;Tactile cues;Verbal cues;Handout    Comprehension  Verbalized understanding;Returned demonstration;Verbal cues required       PT Short Term Goals - 05/19/18 1434      PT SHORT TERM GOAL #2   Title  improve Rt LE strength to wean to 1 crutch for all distances    Time  4    Period  Weeks    Status  Achieved      PT SHORT TERM GOAL #3   Title  demonstrate Rt knee A/ROM flexion to > or = to 95 degrees to allow for sitting without  limitation     Time  4    Period  Weeks    Status  Achieved        PT Long Term Goals - 05/15/18 1245      PT LONG TERM GOAL #1   Title  be independent in advanced HEP    Time  8    Period  Weeks    Status  New    Target Date  07/10/18      PT LONG TERM GOAL #2   Title  wean from crutches and demonstrate symmetry with ambulation on level surfaces    Time  8    Period  Weeks    Status  New    Target Date  07/10/18      PT LONG TERM GOAL #3   Title  demonstrate Rt knee A/ROM flexion to > or = to 110 degrees to improve squatting, negotiating steps and driving    Time  8    Period  Weeks    Status  New    Target Date  07/10/18      PT LONG TERM GOAL #4   Title  reduce FOTO to < or = to 48% limitation    Time  8    Period  Weeks    Status  New    Target Date  07/10/18      PT LONG TERM GOAL #5   Title  improve Rt LE strength to walk for > or = to 30 minutes without limitation    Time  8    Period  Weeks    Status  New    Target Date  07/10/18            Plan - 05/19/18 1409    Clinical Impression Statement  Pt ambulates into clinic with one axillary crutch today. She flexed her knee to 95 degrees in supine today. These 2 milestones were short term goals met. Pt has decreased patella mobility.  LAQ were difficult against gravity. Knee remains with edema but improving.     Rehab Potential  Good    PT Frequency  3x / week    PT Duration  8 weeks    PT Treatment/Interventions  ADLs/Self Care Home Management;Cryotherapy;Electrical Stimulation;Moist Heat;Gait training;Stair  training;Functional mobility training;Therapeutic activities;Therapeutic exercise;Neuromuscular re-education;Patient/family education;Passive range of motion;Scar mobilization;Manual techniques;Taping;Vasopneumatic Device    PT Next Visit Plan  Rt knee A/ROM, NuStep, strength, manual for ROM gains, Game Ready, gait    Consulted and Agree with Plan of Care  Patient       Patient will benefit from  skilled therapeutic intervention in order to improve the following deficits and impairments:  Abnormal gait, Pain, Decreased scar mobility, Decreased strength, Decreased range of motion, Decreased endurance, Decreased activity tolerance, Increased edema, Impaired flexibility, Difficulty walking  Visit Diagnosis: Chronic pain of right knee  Stiffness of right knee, not elsewhere classified  Other abnormalities of gait and mobility  Muscle weakness (generalized)  Localized edema     Problem List Patient Active Problem List   Diagnosis Date Noted  . Closed displaced comminuted fracture of right patella 03/31/2018  . Distal radius fracture, left 01/21/2016    Mccrae Speciale, {PTA 05/19/2018, 2:48 PM  [facility] [address], [place], [place], [address] Phone: [phone]   Fax:  [phone]  Name: Kylie Christensen MRN: [id number] Date of Birth: 08-Mar-1941

## 2018-05-19 NOTE — Patient Instructions (Signed)
Gastroc Stretch    Stand with right foot back, leg straight, forward leg bent. Keeping heel on floor, turned slightly out, lean into wall until stretch is felt in calf. Hold _30___ seconds. Repeat _3___ times per set.  Do __3__ sessions per day.  http://orth.exer.us/27   Copyright  VHI. All rights reserved.

## 2018-05-21 ENCOUNTER — Ambulatory Visit: Payer: PPO | Admitting: Physical Therapy

## 2018-05-21 DIAGNOSIS — R600 Localized edema: Secondary | ICD-10-CM

## 2018-05-21 DIAGNOSIS — M6281 Muscle weakness (generalized): Secondary | ICD-10-CM

## 2018-05-21 DIAGNOSIS — M25561 Pain in right knee: Principal | ICD-10-CM

## 2018-05-21 DIAGNOSIS — M25661 Stiffness of right knee, not elsewhere classified: Secondary | ICD-10-CM

## 2018-05-21 DIAGNOSIS — R2689 Other abnormalities of gait and mobility: Secondary | ICD-10-CM

## 2018-05-21 DIAGNOSIS — G8929 Other chronic pain: Secondary | ICD-10-CM

## 2018-05-21 NOTE — Therapy (Signed)
[facility] [address] [place], [place], [address] Phone: [phone]   Fax:  [phone]  Physical Therapy Treatment  Patient Details  Name: Kylie Christensen MRN: [id number] Date of Birth: [birth year] Referring Provider: Joni Fears, MD   Encounter Date: 05/21/2018  PT End of Session - 05/21/18 1408    Visit Number  3    Authorization Type  Medicare    PT Start Time  1400    PT Stop Time  1500    PT Time Calculation (min)  60 min    Activity Tolerance  Patient tolerated treatment well    Behavior During Therapy  [facility] for tasks assessed/performed       Past Medical History:  Diagnosis Date  . Anxiety   . Colitis   . GERD (gastroesophageal reflux disease)   . Headache   . Hypothyroidism     Past Surgical History:  Procedure Laterality Date  . ABDOMINAL HYSTERECTOMY     partial  . BACK SURGERY  nov 2011   lower back  . BALLOON DILATION N/A 01/25/2015   Procedure: BALLOON DILATION;  Surgeon: Garlan Fair, MD;  Location: [facility];  Service: Endoscopy;  Laterality: N/A;  . BIOPSY BREAST Left yrs ago   benign  . COLONOSCOPY WITH PROPOFOL N/A 01/25/2015   Procedure: COLONOSCOPY WITH PROPOFOL;  Surgeon: Garlan Fair, MD;  Location: [facility];  Service: Endoscopy;  Laterality: N/A;  . ESOPHAGOGASTRODUODENOSCOPY (EGD) WITH PROPOFOL N/A 01/25/2015   Procedure: ESOPHAGOGASTRODUODENOSCOPY (EGD) WITH PROPOFOL;  Surgeon: Garlan Fair, MD;  Location: [facility];  Service: Endoscopy;  Laterality: N/A;  . KNEE SURGERY    . OPEN REDUCTION INTERNAL FIXATION (ORIF) DISTAL RADIAL FRACTURE Left 01/21/2016   Procedure: OPEN REDUCTION INTERNAL FIXATION (ORIF) DISTAL RADIAL FRACTURE;  Surgeon: Iran Planas, MD;  Location: [facility];  Service: Orthopedics;  Laterality: Left;  . rotator cuffr Right   . SHOULDER ARTHROSCOPY    . thyroid nodule removed  1970  . TONSILLECTOMY  age 1  . WRIST FRACTURE SURGERY       There were no vitals filed for this visit.  Subjective Assessment - 05/21/18 1409    Subjective  I am stiff in my knee and it hurts on the inside and outside of my knee.    Pertinent History  Rt patellar fracture: ORIF surgery 04/03/18    Limitations  Standing;Walking    How long can you stand comfortably?  Lt>Rt weightbearing: 10 minutes    Currently in Pain?  Yes    Pain Score  4     Pain Location  Knee    Pain Orientation  Right    Pain Descriptors / Indicators  Tightness;Sore    Aggravating Factors   Overdoing it    Pain Relieving Factors  Meds, ice    Multiple Pain Sites  No         [facility] PT Assessment - 05/21/18 0001      AROM   Right Knee Flexion  100                   [facility] Adult PT Treatment/Exercise - 05/21/18 0001      Knee/Hip Exercises: Aerobic   Nustep  L2 x10 min      Knee/Hip Exercises: Standing   Hip Abduction  Stengthening;Both;1 set;20 reps;Knee straight    Abduction Limitations  1# added    Rebounder  weight shifting 3 ways 1 min each  Knee/Hip Exercises: Seated   Long Arc Quad  AROM;Strengthening;Right;2 sets;10 reps 0#, then 1# on second set    Sit to Sand  1 set;10 reps;without UE support      Vasopneumatic   Number Minutes Vasopneumatic   15 minutes    Vasopnuematic Location   Knee    Vasopneumatic Pressure  Medium    Vasopneumatic Temperature   3 snowflakes               PT Short Term Goals - 05/21/18 1424      PT SHORT TERM GOAL #1   Title  be independent in initial HEP    Time  4    Period  Weeks    Status  Achieved      PT SHORT TERM GOAL #4   Title  reduce Rt knee ambulate for > or = to 20 minutes in the community to improve independence    Time  4    Period  Weeks    Status  On-going 10-15 min        PT Long Term Goals - 05/15/18 1245      PT LONG TERM GOAL #1   Title  be independent in advanced HEP    Time  8    Period  Weeks    Status  New    Target Date  07/10/18      PT LONG TERM  GOAL #2   Title  wean from crutches and demonstrate symmetry with ambulation on level surfaces    Time  8    Period  Weeks    Status  New    Target Date  07/10/18      PT LONG TERM GOAL #3   Title  demonstrate Rt knee A/ROM flexion to > or = to 110 degrees to improve squatting, negotiating steps and driving    Time  8    Period  Weeks    Status  New    Target Date  07/10/18      PT LONG TERM GOAL #4   Title  reduce FOTO to < or = to 48% limitation    Time  8    Period  Weeks    Status  New    Target Date  07/10/18      PT LONG TERM GOAL #5   Title  improve Rt LE strength to walk for > or = to 30 minutes without limitation    Time  8    Period  Weeks    Status  New    Target Date  07/10/18            Plan - 05/21/18 1409    Clinical Impression Statement  Pt with muscle soreness and stiffness in her knee today. She achieved 100 degrees of knee flexion today in supine. She has almost met all STGs this week, she is progressing towards walking for 20 min. Currently she is walking 10-15 minutes. We added light resistance to her LAQ which was difficult but she was able to complete     Rehab Potential  Good    PT Frequency  3x / week    PT Duration  8 weeks    PT Treatment/Interventions  ADLs/Self Care Home Management;Cryotherapy;Electrical Stimulation;Moist Heat;Gait training;Stair training;Functional mobility training;Therapeutic activities;Therapeutic exercise;Neuromuscular re-education;Patient/family education;Passive range of motion;Scar mobilization;Manual techniques;Taping;Vasopneumatic Device    PT Next Visit Plan  Rt knee A/ROM, NuStep, strength, manual for ROM gains, Game Ready, gait  Consulted and Agree with Plan of Care  Patient       Patient will benefit from skilled therapeutic intervention in order to improve the following deficits and impairments:  Abnormal gait, Pain, Decreased scar mobility, Decreased strength, Decreased range of motion, Decreased endurance,  Decreased activity tolerance, Increased edema, Impaired flexibility, Difficulty walking  Visit Diagnosis: Chronic pain of right knee  Stiffness of right knee, not elsewhere classified  Other abnormalities of gait and mobility  Muscle weakness (generalized)  Localized edema     Problem List Patient Active Problem List   Diagnosis Date Noted  . Closed displaced comminuted fracture of right patella 03/31/2018  . Distal radius fracture, left 01/21/2016    Kylie Christensen, Kylie Christensen 05/21/2018, 2:43 PM  [facility] [address], [place], [place], [address] Phone: [phone]   Fax:  [phone]  Name: Kylie Christensen MRN: [id number] Date of Birth: 09-24-1941

## 2018-05-26 ENCOUNTER — Ambulatory Visit: Payer: PPO

## 2018-05-26 DIAGNOSIS — M25661 Stiffness of right knee, not elsewhere classified: Secondary | ICD-10-CM

## 2018-05-26 DIAGNOSIS — M25561 Pain in right knee: Secondary | ICD-10-CM | POA: Diagnosis not present

## 2018-05-26 DIAGNOSIS — R2689 Other abnormalities of gait and mobility: Secondary | ICD-10-CM

## 2018-05-26 DIAGNOSIS — G8929 Other chronic pain: Secondary | ICD-10-CM

## 2018-05-26 DIAGNOSIS — M6281 Muscle weakness (generalized): Secondary | ICD-10-CM

## 2018-05-26 DIAGNOSIS — R600 Localized edema: Secondary | ICD-10-CM

## 2018-05-26 NOTE — Therapy (Signed)
[facility] [address] [place], [place], [address] Phone: [phone]   Fax:  [phone]  Physical Therapy Treatment  Patient Details  Name: Kylie Christensen MRN: [id number] Date of Birth: 1941/07/25 Referring Provider: Joni Fears, MD   Encounter Date: 05/26/2018  PT End of Session - 05/26/18 1444    Visit Number  4    Authorization Type  Medicare    PT Start Time  1497    PT Stop Time  1458    PT Time Calculation (min)  56 min    Activity Tolerance  Patient tolerated treatment well    Behavior During Therapy  [facility] for tasks assessed/performed       Past Medical History:  Diagnosis Date  . Anxiety   . Colitis   . GERD (gastroesophageal reflux disease)   . Headache   . Hypothyroidism     Past Surgical History:  Procedure Laterality Date  . ABDOMINAL HYSTERECTOMY     partial  . BACK SURGERY  nov 2011   lower back  . BALLOON DILATION N/A 01/25/2015   Procedure: BALLOON DILATION;  Surgeon: Garlan Fair, MD;  Location: [facility];  Service: Endoscopy;  Laterality: N/A;  . BIOPSY BREAST Left yrs ago   benign  . COLONOSCOPY WITH PROPOFOL N/A 01/25/2015   Procedure: COLONOSCOPY WITH PROPOFOL;  Surgeon: Garlan Fair, MD;  [facility]: [facility];  Service: Endoscopy;  Laterality: N/A;  . ESOPHAGOGASTRODUODENOSCOPY (EGD) WITH PROPOFOL N/A 01/25/2015   Procedure: ESOPHAGOGASTRODUODENOSCOPY (EGD) WITH PROPOFOL;  Surgeon: Garlan Fair, MD;  [facility]: [facility];  Service: Endoscopy;  Laterality: N/A;  . KNEE SURGERY    . OPEN REDUCTION INTERNAL FIXATION (ORIF) DISTAL RADIAL FRACTURE Left 01/21/2016   Procedure: OPEN REDUCTION INTERNAL FIXATION (ORIF) DISTAL RADIAL FRACTURE;  Surgeon: Iran Planas, MD;  Location: [facility];  Service: Orthopedics;  Laterality: Left;  . rotator cuffr Right   . SHOULDER ARTHROSCOPY    . thyroid nodule removed  1970  . TONSILLECTOMY  age 70  . WRIST FRACTURE SURGERY       There were no vitals filed for this visit.  Subjective Assessment - 05/26/18 1400    Subjective  I just dont feel like I am not doing as well as I should be.      Currently in Pain?  Yes    Pain Score  4     Pain Location  Knee    Pain Orientation  Right    Pain Descriptors / Indicators  Tightness;Sore    Pain Type  Surgical pain    Pain Onset  More than a month ago    Pain Frequency  Constant    Aggravating Factors   It is just stiff and it doesn't matter what I do, walking a long time    Pain Relieving Factors  meds, ice                       [facility] Adult PT Treatment/Exercise - 05/26/18 0001      Knee/Hip Exercises: Stretches   Active Hamstring Stretch  Right;3 reps;20 seconds    Gastroc Stretch  Right;2 reps;20 seconds      Knee/Hip Exercises: Aerobic   Nustep  L2 x10 min PT present to discuss progress      Knee/Hip Exercises: Standing   Hip Abduction  Stengthening;Both;1 set;20 reps;Knee straight    Hip Extension  Stengthening;Both;2 sets;10 reps    Rebounder  weight  shifting 3 ways 1 min each      Knee/Hip Exercises: Supine   Quad Sets  Strengthening;Right;1 set;10 reps    Heel Slides  Strengthening;Right;10 reps      Vasopneumatic   Number Minutes Vasopneumatic   15 minutes    Vasopnuematic Location   Knee    Vasopneumatic Pressure  Medium    Vasopneumatic Temperature   3 snowflakes             PT Education - 05/26/18 1439    Education Details  Access Code: [email or web address]    Person(s) Educated  Patient    Methods  Explanation;Demonstration;Handout    Comprehension  Verbalized understanding;Returned demonstration       PT Short Term Goals - 05/21/18 1424      PT SHORT TERM GOAL #1   Title  be independent in initial HEP    Time  4    Period  Weeks    Status  Achieved      PT SHORT TERM GOAL #4   Title  reduce Rt knee ambulate for > or = to 20 minutes in the community to improve independence    Time  4    Period  Weeks    Status   On-going 10-15 min        PT Long Term Goals - 05/15/18 1245      PT LONG TERM GOAL #1   Title  be independent in advanced HEP    Time  8    Period  Weeks    Status  New    Target Date  07/10/18      PT LONG TERM GOAL #2   Title  wean from crutches and demonstrate symmetry with ambulation on level surfaces    Time  8    Period  Weeks    Status  New    Target Date  07/10/18      PT LONG TERM GOAL #3   Title  demonstrate Rt knee A/ROM flexion to > or = to 110 degrees to improve squatting, negotiating steps and driving    Time  8    Period  Weeks    Status  New    Target Date  07/10/18      PT LONG TERM GOAL #4   Title  reduce FOTO to < or = to 48% limitation    Time  8    Period  Weeks    Status  New    Target Date  07/10/18      PT LONG TERM GOAL #5   Title  improve Rt LE strength to walk for > or = to 30 minutes without limitation    Time  8    Period  Weeks    Status  New    Target Date  07/10/18            Plan - 05/26/18 1404    Clinical Impression Statement  Pt is discouraged regarding improvement.  Pt demonstrates improved gait pattern with use of one crutch and pt has met most of her STGs.  Pt is able to walk for 10-15 minutes to the dining room at [facility].  Pt with improved Rt knee flexion.  Pt tolerates all exercises without limitation today.  PT provided review of HEP and printed all exercises together.      Rehab Potential  Good    PT Frequency  3x / week    PT Duration  8 weeks  PT Treatment/Interventions  ADLs/Self Care Home Management;Cryotherapy;Electrical Stimulation;Moist Heat;Gait training;Stair training;Functional mobility training;Therapeutic activities;Therapeutic exercise;Neuromuscular re-education;Patient/family education;Passive range of motion;Scar mobilization;Manual techniques;Taping;Vasopneumatic Device    PT Next Visit Plan  Rt knee A/ROM, NuStep, strength, manual for ROM gains, Game Ready, gait    PT Home Exercise Plan   Access Code: [email or web address]    Recommended Other Services  MD signed initial certification    Consulted and Agree with Plan of Care  Patient       Patient will benefit from skilled therapeutic intervention in order to improve the following deficits and impairments:  Abnormal gait, Pain, Decreased scar mobility, Decreased strength, Decreased range of motion, Decreased endurance, Decreased activity tolerance, Increased edema, Impaired flexibility, Difficulty walking  Visit Diagnosis: Chronic pain of right knee  Stiffness of right knee, not elsewhere classified  Muscle weakness (generalized)  Other abnormalities of gait and mobility  Localized edema     Problem List Patient Active Problem List   Diagnosis Date Noted  . Closed displaced comminuted fracture of right patella 03/31/2018  . Distal radius fracture, left 01/21/2016     Sigurd Sos, PT 05/26/18 2:46 PM  [facility] [address] [place], [place], [address] Phone: [phone]   Fax:  [phone]  Name: Kylie Christensen MRN: [id number] Date of Birth: 12-06-1941

## 2018-05-26 NOTE — Patient Instructions (Signed)
Access Code: [email or web address]  URL: [email or web address]  Date: 05/26/2018  Prepared by: Sigurd Sos   Exercises  Seated Long Arc Quad - 10 reps - 2 sets - 5 hold - 2x daily - 7x weekly  Seated Hamstring Stretch - 3 reps - 1 sets - 20 hold - 3x daily - 7x weekly  Gastroc Stretch on Wall - 3 reps - 1 sets - 20 hold - 3x daily - 7x weekly  Supine Heel Slide with Strap - 5 reps - 3 sets - 10 hold - 2x daily - 7x weekly  Standing Hip Abduction - 10 reps - 2 sets - 2x daily - 7x weekly  Standing Hip Extension - 10 reps - 2 sets - 2x daily - 7x weekly  Supine Quad Set - 10 reps - 3 sets - 5 hold - 1x daily - 7x weekly

## 2018-05-28 ENCOUNTER — Ambulatory Visit: Payer: PPO

## 2018-05-28 DIAGNOSIS — M25561 Pain in right knee: Secondary | ICD-10-CM | POA: Diagnosis not present

## 2018-05-28 DIAGNOSIS — R600 Localized edema: Secondary | ICD-10-CM

## 2018-05-28 DIAGNOSIS — G8929 Other chronic pain: Secondary | ICD-10-CM

## 2018-05-28 DIAGNOSIS — R2689 Other abnormalities of gait and mobility: Secondary | ICD-10-CM

## 2018-05-28 DIAGNOSIS — M25661 Stiffness of right knee, not elsewhere classified: Secondary | ICD-10-CM

## 2018-05-28 DIAGNOSIS — M6281 Muscle weakness (generalized): Secondary | ICD-10-CM

## 2018-05-28 NOTE — Therapy (Signed)
[facility] [address], [place], [place], [address] Phone: [phone]   Fax:  [phone]  Physical Therapy Treatment  Patient Details  Name: Kylie Christensen MRN: [id number] Date of Birth: 03-23-1941 Referring Provider: Joni Fears, MD   Encounter Date: 05/28/2018  PT End of Session - 05/28/18 1222    Visit Number  5    Authorization Type  Medicare    PT Start Time  1146    PT Stop Time  1242    PT Time Calculation (min)  56 min    Activity Tolerance  Patient tolerated treatment well    Behavior During Therapy  [facility] for tasks assessed/performed       Past Medical History:  Diagnosis Date  . Anxiety   . Colitis   . GERD (gastroesophageal reflux disease)   . Headache   . Hypothyroidism     Past Surgical History:  Procedure Laterality Date  . ABDOMINAL HYSTERECTOMY     partial  . BACK SURGERY  nov 2011   lower back  . BALLOON DILATION N/A 01/25/2015   Procedure: BALLOON DILATION;  Surgeon: Garlan Fair, MD;  Location: [facility];  Service: Endoscopy;  Laterality: N/A;  . BIOPSY BREAST Left yrs ago   benign  . COLONOSCOPY WITH PROPOFOL N/A 01/25/2015   Procedure: COLONOSCOPY WITH PROPOFOL;  Surgeon: Garlan Fair, MD;  [facility]: [facility];  Service: Endoscopy;  Laterality: N/A;  . ESOPHAGOGASTRODUODENOSCOPY (EGD) WITH PROPOFOL N/A 01/25/2015   Procedure: ESOPHAGOGASTRODUODENOSCOPY (EGD) WITH PROPOFOL;  Surgeon: Garlan Fair, MD;  [facility]: [facility];  Service: Endoscopy;  Laterality: N/A;  . KNEE SURGERY    . OPEN REDUCTION INTERNAL FIXATION (ORIF) DISTAL RADIAL FRACTURE Left 01/21/2016   Procedure: OPEN REDUCTION INTERNAL FIXATION (ORIF) DISTAL RADIAL FRACTURE;  Surgeon: Iran Planas, MD;  Location: [facility];  Service: Orthopedics;  Laterality: Left;  . rotator cuffr Right   . SHOULDER ARTHROSCOPY    . thyroid nodule removed  1970  . TONSILLECTOMY  age 18  . WRIST FRACTURE SURGERY       There were no vitals filed for this visit.  Subjective Assessment - 05/28/18 1152    Subjective  I am not getting any better.  This knee just wont bend.      Pertinent History  Rt patellar fracture: ORIF surgery 04/03/18    Currently in Pain?  Yes    Pain Score  6     Pain Location  Knee    Pain Orientation  Right                       [facility] Adult PT Treatment/Exercise - 05/28/18 0001      Knee/Hip Exercises: Stretches   Active Hamstring Stretch  Right;3 reps;20 seconds    Gastroc Stretch  Right;2 reps;20 seconds      Knee/Hip Exercises: Aerobic   Stationary Bike  for ROM: flexion x 8 minutes PT present to discuss progress    Nustep  L2 x10 min verbal cues for quad activation      Knee/Hip Exercises: Standing   Hip Abduction  Stengthening;Both;1 set;20 reps;Knee straight    Hip Extension  Stengthening;Both;2 sets;10 reps    Rebounder  weight shifting 3 ways 1 min each      Knee/Hip Exercises: Seated   Long Arc Quad  AROM;Strengthening;Right;10 reps;1 set      Knee/Hip Exercises: Supine   Quad Sets  Strengthening;Right;1 set;10 reps  Heel Slides  Strengthening;Right;10 reps      Vasopneumatic   Number Minutes Vasopneumatic   15 minutes    Vasopnuematic Location   Knee    Vasopneumatic Pressure  Medium    Vasopneumatic Temperature   3 snowflakes       treatment provided by Myrene Galas, PTA        PT Short Term Goals - 05/21/18 1424      PT SHORT TERM GOAL #1   Title  be independent in initial HEP    Time  4    Period  Weeks    Status  Achieved      PT SHORT TERM GOAL #4   Title  reduce Rt knee ambulate for > or = to 20 minutes in the community to improve independence    Time  4    Period  Weeks    Status  On-going 10-15 min        PT Long Term Goals - 05/15/18 1245      PT LONG TERM GOAL #1   Title  be independent in advanced HEP    Time  8    Period  Weeks    Status  New    Target Date  07/10/18      PT LONG TERM  GOAL #2   Title  wean from crutches and demonstrate symmetry with ambulation on level surfaces    Time  8    Period  Weeks    Status  New    Target Date  07/10/18      PT LONG TERM GOAL #3   Title  demonstrate Rt knee A/ROM flexion to > or = to 110 degrees to improve squatting, negotiating steps and driving    Time  8    Period  Weeks    Status  New    Target Date  07/10/18      PT LONG TERM GOAL #4   Title  reduce FOTO to < or = to 48% limitation    Time  8    Period  Weeks    Status  New    Target Date  07/10/18      PT LONG TERM GOAL #5   Title  improve Rt LE strength to walk for > or = to 30 minutes without limitation    Time  8    Period  Weeks    Status  New    Target Date  07/10/18            Plan - 05/28/18 1153    Clinical Impression Statement  Pt continues to be discouraged regarding lack of progress.  Pt demonstrates improved gait with1 crutch and has met most of her STGs.  Pt is now able to walk to the dining room at [facility].  Pt demonstrates improved knee flexion and continues to work on this at home.  Pt requires verbal and demo cues to reduce substitution with knee A/ROM today.  Pt will continue to benefit from skilled PT for Rt LE strength, flexibility and gait training to allow for independence at home and in the community.      Rehab Potential  Good    PT Frequency  3x / week    PT Duration  8 weeks    PT Treatment/Interventions  ADLs/Self Care Home Management;Cryotherapy;Electrical Stimulation;Moist Heat;Gait training;Stair training;Functional mobility training;Therapeutic activities;Therapeutic exercise;Neuromuscular re-education;Patient/family education;Passive range of motion;Scar mobilization;Manual techniques;Taping;Vasopneumatic Device    PT Next Visit Plan  Rt knee A/ROM, NuStep, strength, manual for ROM gains, Game Ready, gait    PT Home Exercise Plan  Access Code: [email or web address]    Consulted and Agree with Plan of Care  Patient       Patient  will benefit from skilled therapeutic intervention in order to improve the following deficits and impairments:  Abnormal gait, Pain, Decreased scar mobility, Decreased strength, Decreased range of motion, Decreased endurance, Decreased activity tolerance, Increased edema, Impaired flexibility, Difficulty walking  Visit Diagnosis: Chronic pain of right knee  Stiffness of right knee, not elsewhere classified  Muscle weakness (generalized)  Other abnormalities of gait and mobility  Localized edema     Problem List Patient Active Problem List   Diagnosis Date Noted  . Closed displaced comminuted fracture of right patella 03/31/2018  . Distal radius fracture, left 01/21/2016   Sigurd Sos, PT 05/28/18 12:26 PM  [facility] [address] [place], [place], [address] Phone: [phone]   Fax:  [phone]  Name: Kylie Christensen MRN: [id number] Date of Birth: 11-01-1941

## 2018-05-30 ENCOUNTER — Ambulatory Visit: Payer: PPO | Admitting: Physical Therapy

## 2018-05-30 ENCOUNTER — Encounter: Payer: Self-pay | Admitting: Physical Therapy

## 2018-05-30 DIAGNOSIS — M6281 Muscle weakness (generalized): Secondary | ICD-10-CM

## 2018-05-30 DIAGNOSIS — M25561 Pain in right knee: Principal | ICD-10-CM

## 2018-05-30 DIAGNOSIS — G8929 Other chronic pain: Secondary | ICD-10-CM

## 2018-05-30 DIAGNOSIS — R2689 Other abnormalities of gait and mobility: Secondary | ICD-10-CM

## 2018-05-30 DIAGNOSIS — R600 Localized edema: Secondary | ICD-10-CM

## 2018-05-30 DIAGNOSIS — M25661 Stiffness of right knee, not elsewhere classified: Secondary | ICD-10-CM
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
## 2018-06-02 ENCOUNTER — Ambulatory Visit: Payer: PPO | Admitting: Physical Therapy

## 2018-06-02 ENCOUNTER — Encounter: Payer: Self-pay | Admitting: Physical Therapy

## 2018-06-02 DIAGNOSIS — M25661 Stiffness of right knee, not elsewhere classified: Secondary | ICD-10-CM

## 2018-06-02 DIAGNOSIS — M25561 Pain in right knee: Principal | ICD-10-CM

## 2018-06-02 DIAGNOSIS — R2689 Other abnormalities of gait and mobility: Secondary | ICD-10-CM

## 2018-06-02 DIAGNOSIS — R600 Localized edema: Secondary | ICD-10-CM

## 2018-06-02 DIAGNOSIS — M6281 Muscle weakness (generalized): Secondary | ICD-10-CM

## 2018-06-02 DIAGNOSIS — G8929 Other chronic pain: Secondary | ICD-10-CM

## 2018-06-02 NOTE — Therapy (Signed)
[facility] [address] [place], [place], [address] Phone: [phone]   Fax:  [phone]  Physical Therapy Treatment  Patient Details  Name: Kylie Christensen MRN: [id number] Date of Birth: August 79, 1942 Referring Provider: Joni Fears, MD   Encounter Date: 06/02/2018  PT End of Session - 06/02/18 1201    Visit Number  7    Authorization Type  Medicare    PT Start Time  2774 Power was down when pt signed in initially    PT Stop Time  1240    PT Time Calculation (min)  55 min    Activity Tolerance  Patient tolerated treatment well    Behavior During Therapy  [facility] for tasks assessed/performed       Past Medical History:  Diagnosis Date  . Anxiety   . Colitis   . GERD (gastroesophageal reflux disease)   . Headache   . Hypothyroidism     Past Surgical History:  Procedure Laterality Date  . ABDOMINAL HYSTERECTOMY     partial  . BACK SURGERY  nov 2011   lower back  . BALLOON DILATION N/A 01/25/2015   Procedure: BALLOON DILATION;  Surgeon: Garlan Fair, MD;  Location: [facility];  Service: Endoscopy;  Laterality: N/A;  . BIOPSY BREAST Left yrs ago   benign  . COLONOSCOPY WITH PROPOFOL N/A 01/25/2015   Procedure: COLONOSCOPY WITH PROPOFOL;  Surgeon: Garlan Fair, MD;  Location: [facility];  Service: Endoscopy;  Laterality: N/A;  . ESOPHAGOGASTRODUODENOSCOPY (EGD) WITH PROPOFOL N/A 01/25/2015   Procedure: ESOPHAGOGASTRODUODENOSCOPY (EGD) WITH PROPOFOL;  Surgeon: Garlan Fair, MD;  Location: [facility];  Service: Endoscopy;  Laterality: N/A;  . KNEE SURGERY    . OPEN REDUCTION INTERNAL FIXATION (ORIF) DISTAL RADIAL FRACTURE Left 01/21/2016   Procedure: OPEN REDUCTION INTERNAL FIXATION (ORIF) DISTAL RADIAL FRACTURE;  Surgeon: Iran Planas, MD;  Location: [facility] [facility];  Service: Orthopedics;  Laterality: Left;  . rotator cuffr Right   . SHOULDER ARTHROSCOPY    . thyroid nodule removed  1970  .  TONSILLECTOMY  age 46  . WRIST FRACTURE SURGERY      There were no vitals filed for this visit.  Subjective Assessment - 06/02/18 1202    Subjective  My knee feels much better than on Friday. I used ice and did pain free heel slides. Back to 1 axillary crutch with ambulation.    Pertinent History  Rt patellar fracture: ORIF surgery 04/03/18    Currently in Pain?  Yes    Pain Score  5     Pain Location  Knee    Pain Orientation  Right    Pain Descriptors / Indicators  Sore;Tightness    Multiple Pain Sites  No         [facility] PT Assessment - 06/02/18 0001      AROM   Right Knee Flexion  106                   [facility] Adult PT Treatment/Exercise - 06/02/18 0001      Knee/Hip Exercises: Aerobic   Nustep  L2 x10 min verbal cues for quad activation      Knee/Hip Exercises: Standing   Heel Raises  Both;1 set;10 reps    Knee Flexion  AROM;Strengthening;Both;2 sets;10 reps    Hip Abduction  AROM;Stengthening;Both;2 sets;10 reps;Knee straight    Rebounder  weight shifting 3 ways 1 min each      Knee/Hip Exercises: Seated  Long Arc [employer]  AROM;Strengthening;Right;2 sets;10 Merchandiser, retail Parameters  1-10 HZ    Electrical Stimulation Goals  Edema;Pain      Vasopneumatic   Number Minutes Vasopneumatic   15 minutes    Vasopnuematic Location   Knee    Vasopneumatic Pressure  Medium    Vasopneumatic Temperature   3 snowflakes      Manual Therapy   Joint Mobilization  Patella mobs in 4 directions               PT Short Term Goals - 06/02/18 1331      PT SHORT TERM GOAL #4   Title  reduce Rt knee ambulate for > or = to 20 minutes in the community to improve independence    Time  4    Period  Weeks    Status  On-going        PT Long Term Goals - 05/15/18 1245      PT LONG TERM GOAL #1   Title  be independent in advanced HEP     Time  8    Period  Weeks    Status  New    Target Date  07/10/18      PT LONG TERM GOAL #2   Title  wean from crutches and demonstrate symmetry with ambulation on level surfaces    Time  8    Period  Weeks    Status  New    Target Date  07/10/18      PT LONG TERM GOAL #3   Title  demonstrate Rt knee A/ROM flexion to > or = to 110 degrees to improve squatting, negotiating steps and driving    Time  8    Period  Weeks    Status  New    Target Date  07/10/18      PT LONG TERM GOAL #4   Title  reduce FOTO to < or = to 48% limitation    Time  8    Period  Weeks    Status  New    Target Date  07/10/18      PT LONG TERM GOAL #5   Title  improve Rt LE strength to walk for > or = to 30 minutes without limitation    Time  8    Period  Weeks    Status  New    Target Date  07/10/18            Plan - 06/02/18 1328    Clinical Impression Statement  Pt presented today much better than her last session, less pain and swelling in her knee. She measures today with 106 degrees of knee flexion and was back to ambulating with 1 axillary crutch. pt was able to perform standing and seated strength exercises against gravity today.  This made her knee sore throughout the session. She finds 1- 1 1/2 days of relief after the combo of estim and Game ready.     Rehab Potential  Good    PT Frequency  3x / week    PT Duration  8 weeks    PT Treatment/Interventions  ADLs/Self Care Home Management;Cryotherapy;Electrical Stimulation;Moist Heat;Gait training;Stair training;Functional mobility training;Therapeutic activities;Therapeutic exercise;Neuromuscular re-education;Patient/family education;Passive range of motion;Scar mobilization;Manual techniques;Taping;Vasopneumatic Device    PT Next Visit Plan  Hip and knee strength and  knee AROM    PT Home Exercise Plan  Access Code: [email or web address]    Consulted and Agree with Plan of Care  Patient       Patient will benefit from skilled therapeutic intervention  in order to improve the following deficits and impairments:  Abnormal gait, Pain, Decreased scar mobility, Decreased strength, Decreased range of motion, Decreased endurance, Decreased activity tolerance, Increased edema, Impaired flexibility, Difficulty walking  Visit Diagnosis: Chronic pain of right knee  Stiffness of right knee, not elsewhere classified  Muscle weakness (generalized)  Other abnormalities of gait and mobility  Localized edema     Problem List Patient Active Problem List   Diagnosis Date Noted  . Closed displaced comminuted fracture of right patella 03/31/2018  . Distal radius fracture, left 01/21/2016    Omarii Scalzo, PTA 06/02/2018, 1:32 PM  [facility] [address] [place], [place], [address] Phone: [phone]   Fax:  [phone]  Name: Kylie Christensen MRN: [id number] Date of Birth: 1941/07/11

## 2018-06-03 ENCOUNTER — Telehealth (INDEPENDENT_AMBULATORY_CARE_PROVIDER_SITE_OTHER): Payer: Self-pay | Admitting: Orthopaedic Surgery

## 2018-06-03 ENCOUNTER — Other Ambulatory Visit (INDEPENDENT_AMBULATORY_CARE_PROVIDER_SITE_OTHER): Payer: Self-pay | Admitting: Orthopaedic Surgery

## 2018-06-03 MED ORDER — HYDROCODONE-ACETAMINOPHEN 5-325 MG PO TABS: 1.0000 | ORAL_TABLET | Freq: Four times a day (QID) | ORAL | 0 refills | Status: DC | PRN

## 2018-06-03 NOTE — Telephone Encounter (Signed)
Please advise. Thank you

## 2018-06-03 NOTE — Telephone Encounter (Signed)
Ok to renew if she needs it-please call

## 2018-06-03 NOTE — Telephone Encounter (Signed)
Patient left a voicemail to discuss pain medication.  Patient states she is almost out of her Hydrocodone and doesn't know if it is safe to continue taking the medication.  Patient is requesting a return call.

## 2018-06-04 ENCOUNTER — Ambulatory Visit: Payer: PPO | Admitting: Physical Therapy

## 2018-06-04 ENCOUNTER — Encounter: Payer: Self-pay | Admitting: Physical Therapy

## 2018-06-04 DIAGNOSIS — R2689 Other abnormalities of gait and mobility: Secondary | ICD-10-CM

## 2018-06-04 DIAGNOSIS — M25561 Pain in right knee: Secondary | ICD-10-CM | POA: Diagnosis not present

## 2018-06-04 DIAGNOSIS — M25661 Stiffness of right knee, not elsewhere classified: Secondary | ICD-10-CM

## 2018-06-04 DIAGNOSIS — R600 Localized edema: Secondary | ICD-10-CM

## 2018-06-04 DIAGNOSIS — G8929 Other chronic pain: Secondary | ICD-10-CM

## 2018-06-04 DIAGNOSIS — M6281 Muscle weakness (generalized): Secondary | ICD-10-CM

## 2018-06-04 NOTE — Therapy (Signed)
[facility] [address] [place], [place], [address] Phone: [phone]   Fax:  [phone]  Physical Therapy Treatment  Patient Details  Name: Kylie Christensen MRN: [id number] Date of Birth: 1941/12/02 Referring Provider: Joni Fears, MD   Encounter Date: 06/04/2018  PT End of Session - 06/04/18 1153    Visit Number  8    Authorization Type  Medicare    PT Start Time  0962    PT Stop Time  1245    PT Time Calculation (min)  60 min    Activity Tolerance  Patient tolerated treatment well    Behavior During Therapy  [facility] for tasks assessed/performed       Past Medical History:  Diagnosis Date  . Anxiety   . Colitis   . GERD (gastroesophageal reflux disease)   . Headache   . Hypothyroidism     Past Surgical History:  Procedure Laterality Date  . ABDOMINAL HYSTERECTOMY     partial  . BACK SURGERY  nov 2011   lower back  . BALLOON DILATION N/A 01/25/2015   Procedure: BALLOON DILATION;  Surgeon: Garlan Fair, MD;  Location: [facility];  Service: Endoscopy;  Laterality: N/A;  . BIOPSY BREAST Left yrs ago   benign  . COLONOSCOPY WITH PROPOFOL N/A 01/25/2015   Procedure: COLONOSCOPY WITH PROPOFOL;  Surgeon: Garlan Fair, MD;  Location: [facility];  Service: Endoscopy;  Laterality: N/A;  . ESOPHAGOGASTRODUODENOSCOPY (EGD) WITH PROPOFOL N/A 01/25/2015   Procedure: ESOPHAGOGASTRODUODENOSCOPY (EGD) WITH PROPOFOL;  Surgeon: Garlan Fair, MD;  Location: [facility];  Service: Endoscopy;  Laterality: N/A;  . KNEE SURGERY    . OPEN REDUCTION INTERNAL FIXATION (ORIF) DISTAL RADIAL FRACTURE Left 01/21/2016   Procedure: OPEN REDUCTION INTERNAL FIXATION (ORIF) DISTAL RADIAL FRACTURE;  Surgeon: Iran Planas, MD;  Location: [facility];  Service: Orthopedics;  Laterality: Left;  . rotator cuffr Right   . SHOULDER ARTHROSCOPY    . thyroid nodule removed  1970  . TONSILLECTOMY  age 50  . WRIST FRACTURE SURGERY       There were no vitals filed for this visit.  Subjective Assessment - 06/04/18 1152    Subjective  I am feeling slightly better this AM.     Pertinent History  Rt patellar fracture: ORIF surgery 04/03/18    Currently in Pain?  Yes    Pain Score  2     Pain Location  Knee    Pain Orientation  Right    Pain Descriptors / Indicators  Dull;Aching;Tightness    Aggravating Factors   pain fairly constant    Pain Relieving Factors  medication, ice, heat    Multiple Pain Sites  No         [facility] PT Assessment - 06/04/18 0001      AROM   Right Knee Flexion  108                   [facility] Adult PT Treatment/Exercise - 06/04/18 0001      Knee/Hip Exercises: Aerobic   Nustep  L2 x10 min verbal cues for quad activation      Knee/Hip Exercises: Standing   Knee Flexion  Strengthening;Right;1 set;10 reps 1# added    Hip Abduction  AROM;Stengthening;Both;2 sets;10 reps;Knee straight 1# added    Forward Step Up  Right;1 set;10 reps;Hand Hold: 2;Step Height: 4" VC to not circumduct    SLS  level surface 10 sec 3x RTLE  Rebounder  weight shifting 3 ways 1 min each      Knee/Hip Exercises: Seated   Long Arc Quad  AROM;Strengthening;Right;1 set;10 reps;Weights    Long Arc Quad Weight  1 lbs.      Control and instrumentation engineer  IFC    Electrical Stimulation Parameters  1-10 HZ    Electrical Stimulation Goals  Edema;Pain      Vasopneumatic   Number Minutes Vasopneumatic   15 minutes    Vasopnuematic Location   Knee    Vasopneumatic Pressure  Medium    Vasopneumatic Temperature   3 snowflakes               PT Short Term Goals - 06/02/18 1331      PT SHORT TERM GOAL #4   Title  reduce Rt knee ambulate for > or = to 20 minutes in the community to improve independence    Time  4    Period  Weeks    Status  On-going        PT Long Term Goals - 05/15/18 1245      PT LONG TERM GOAL #1   Title   be independent in advanced HEP    Time  8    Period  Weeks    Status  New    Target Date  07/10/18      PT LONG TERM GOAL #2   Title  wean from crutches and demonstrate symmetry with ambulation on level surfaces    Time  8    Period  Weeks    Status  New    Target Date  07/10/18      PT LONG TERM GOAL #3   Title  demonstrate Rt knee A/ROM flexion to > or = to 110 degrees to improve squatting, negotiating steps and driving    Time  8    Period  Weeks    Status  New    Target Date  07/10/18      PT LONG TERM GOAL #4   Title  reduce FOTO to < or = to 48% limitation    Time  8    Period  Weeks    Status  New    Target Date  07/10/18      PT LONG TERM GOAL #5   Title  improve Rt LE strength to walk for > or = to 30 minutes without limitation    Time  8    Period  Weeks    Status  New    Target Date  07/10/18            Plan - 06/04/18 1153    Clinical Impression Statement  Pt reports today her pain seems to be better overall. The knee remains swollen and limits her knee flexion. We added light resistance to her strengthening today which she tolerated well. She likes the Game Ready/estim combo for her swelling  and pain. She measures at 108 degrees of knee flexion today.     Rehab Potential  Good    PT Frequency  3x / week    PT Duration  8 weeks    PT Treatment/Interventions  ADLs/Self Care Home Management;Cryotherapy;Electrical Stimulation;Moist Heat;Gait training;Stair training;Functional mobility training;Therapeutic activities;Therapeutic exercise;Neuromuscular re-education;Patient/family education;Passive range of motion;Scar mobilization;Manual techniques;Taping;Vasopneumatic Device    PT Next Visit Plan  Hip and knee strength and knee AROM    PT Home Exercise  Plan  Access Code: [email or web address]    Consulted and Agree with Plan of Care  Patient       Patient will benefit from skilled therapeutic intervention in order to improve the following deficits and impairments:   Abnormal gait, Pain, Decreased scar mobility, Decreased strength, Decreased range of motion, Decreased endurance, Decreased activity tolerance, Increased edema, Impaired flexibility, Difficulty walking  Visit Diagnosis: Chronic pain of right knee  Stiffness of right knee, not elsewhere classified  Muscle weakness (generalized)  Other abnormalities of gait and mobility  Localized edema     Problem List Patient Active Problem List   Diagnosis Date Noted  . Closed displaced comminuted fracture of right patella 03/31/2018  . Distal radius fracture, left 01/21/2016    COCHRAN,JENNIFER, PTA 06/04/2018, 12:30 PM  [facility] [address], [place], [place], [address] Phone: [phone]   Fax:  [phone]  Name: Kylie Christensen MRN: [id number] Date of Birth: 1941/08/14

## 2018-06-06 ENCOUNTER — Encounter: Payer: Self-pay | Admitting: Physical Therapy

## 2018-06-06 ENCOUNTER — Ambulatory Visit: Payer: PPO | Admitting: Physical Therapy

## 2018-06-06 DIAGNOSIS — M6281 Muscle weakness (generalized): Secondary | ICD-10-CM

## 2018-06-06 DIAGNOSIS — R600 Localized edema: Secondary | ICD-10-CM

## 2018-06-06 DIAGNOSIS — R2689 Other abnormalities of gait and mobility: Secondary | ICD-10-CM

## 2018-06-06 DIAGNOSIS — M25561 Pain in right knee: Secondary | ICD-10-CM | POA: Diagnosis not present

## 2018-06-06 DIAGNOSIS — G8929 Other chronic pain: Secondary | ICD-10-CM

## 2018-06-06 DIAGNOSIS — M25661 Stiffness of right knee, not elsewhere classified: Secondary | ICD-10-CM

## 2018-06-06 NOTE — Therapy (Signed)
[facility] [address] [place], [place], [address] Phone: [phone]   Fax:  [phone]  Physical Therapy Treatment  Patient Details  Name: Kylie Christensen MRN: [id number] Date of Birth: 31-Jul-1941 Referring Provider: Joni Fears, MD   Encounter Date: 06/06/2018  PT End of Session - 06/06/18 1106    Visit Number  9    Authorization Type  Medicare    PT Start Time  1100    PT Stop Time  1200    PT Time Calculation (min)  60 min    Activity Tolerance  Patient tolerated treatment well    Behavior During Therapy  [facility] for tasks assessed/performed       Past Medical History:  Diagnosis Date  . Anxiety   . Colitis   . GERD (gastroesophageal reflux disease)   . Headache   . Hypothyroidism     Past Surgical History:  Procedure Laterality Date  . ABDOMINAL HYSTERECTOMY     partial  . BACK SURGERY  nov 2011   lower back  . BALLOON DILATION N/A 01/25/2015   Procedure: BALLOON DILATION;  Surgeon: Garlan Fair, MD;  Location: [facility];  Service: Endoscopy;  Laterality: N/A;  . BIOPSY BREAST Left yrs ago   benign  . COLONOSCOPY WITH PROPOFOL N/A 01/25/2015   Procedure: COLONOSCOPY WITH PROPOFOL;  Surgeon: Garlan Fair, MD;  Location: [facility];  Service: Endoscopy;  Laterality: N/A;  . ESOPHAGOGASTRODUODENOSCOPY (EGD) WITH PROPOFOL N/A 01/25/2015   Procedure: ESOPHAGOGASTRODUODENOSCOPY (EGD) WITH PROPOFOL;  Surgeon: Garlan Fair, MD;  Location: [facility];  Service: Endoscopy;  Laterality: N/A;  . KNEE SURGERY    . OPEN REDUCTION INTERNAL FIXATION (ORIF) DISTAL RADIAL FRACTURE Left 01/21/2016   Procedure: OPEN REDUCTION INTERNAL FIXATION (ORIF) DISTAL RADIAL FRACTURE;  Surgeon: Iran Planas, MD;  Location: [facility];  Service: Orthopedics;  Laterality: Left;  . rotator cuffr Right   . SHOULDER ARTHROSCOPY    . thyroid nodule removed  1970  . TONSILLECTOMY  age 67  . WRIST FRACTURE SURGERY       There were no vitals filed for this visit.  Subjective Assessment - 06/06/18 1107    Subjective  Sore    Currently in Pain?  Yes    Pain Score  5     Pain Location  Knee    Pain Orientation  Right    Pain Descriptors / Indicators  Sore    Multiple Pain Sites  No         [facility] PT Assessment - 06/06/18 0001      AROM   Right Knee Flexion  108                   [facility] Adult PT Treatment/Exercise - 06/06/18 0001      Knee/Hip Exercises: Aerobic   Nustep  L3 x 10 min PTA present      Knee/Hip Exercises: Standing   Knee Flexion  Strengthening;Right;1 set;10 reps 2# added    Hip Abduction  AROM;Stengthening;Both;2 sets;10 reps;Knee straight 2# added    Forward Step Up  Right;1 set;10 reps;Hand Hold: 2;Step Height: 4" VC to not circumduct    Rebounder  weight shifting 3 ways 1 min each      Knee/Hip Exercises: Seated   Long Arc Quad  Strengthening;Right;2 sets;10 reps;Weights    Long Arc Quad Weight  1 lbs. Not quite achieving full extension      Vasopneumatic   Number Minutes  Vasopneumatic   15 minutes    Vasopnuematic Location   Knee    Vasopneumatic Pressure  Medium    Vasopneumatic Temperature   3 snowflakes               PT Short Term Goals - 06/02/18 1331      PT SHORT TERM GOAL #4   Title  reduce Rt knee ambulate for > or = to 20 minutes in the community to improve independence    Time  4    Period  Weeks    Status  On-going        PT Long Term Goals - 05/15/18 1245      PT LONG TERM GOAL #1   Title  be independent in advanced HEP    Time  8    Period  Weeks    Status  New    Target Date  07/10/18      PT LONG TERM GOAL #2   Title  wean from crutches and demonstrate symmetry with ambulation on level surfaces    Time  8    Period  Weeks    Status  New    Target Date  07/10/18      PT LONG TERM GOAL #3   Title  demonstrate Rt knee A/ROM flexion to > or = to 110 degrees to improve squatting, negotiating steps and driving     Time  8    Period  Weeks    Status  New    Target Date  07/10/18      PT LONG TERM GOAL #4   Title  reduce FOTO to < or = to 48% limitation    Time  8    Period  Weeks    Status  New    Target Date  07/10/18      PT LONG TERM GOAL #5   Title  improve Rt LE strength to walk for > or = to 30 minutes without limitation    Time  8    Period  Weeks    Status  New    Target Date  07/10/18            Plan - 06/06/18 1106    Clinical Impression Statement  Increased resisatnce today to work towards pt's RT hip and knee strength. 2# was challenging, not painful. Remains with edema in her knee, could limit the knee flexion as she conitnued to measure at 108 degrees.     Rehab Potential  Good    PT Frequency  3x / week    PT Duration  8 weeks    PT Treatment/Interventions  ADLs/Self Care Home Management;Cryotherapy;Electrical Stimulation;Moist Heat;Gait training;Stair training;Functional mobility training;Therapeutic activities;Therapeutic exercise;Neuromuscular re-education;Patient/family education;Passive range of motion;Scar mobilization;Manual techniques;Taping;Vasopneumatic Device    PT Next Visit Plan  Hip and knee strength and knee AROM    PT Home Exercise Plan  Access Code: [email or web address]    Consulted and Agree with Plan of Care  Patient       Patient will benefit from skilled therapeutic intervention in order to improve the following deficits and impairments:  Abnormal gait, Pain, Decreased scar mobility, Decreased strength, Decreased range of motion, Decreased endurance, Decreased activity tolerance, Increased edema, Impaired flexibility, Difficulty walking  Visit Diagnosis: Chronic pain of right knee  Stiffness of right knee, not elsewhere classified  Muscle weakness (generalized)  Other abnormalities of gait and mobility  Localized edema     Problem List Patient Active Problem  List   Diagnosis Date Noted  . Closed displaced comminuted fracture of right patella  03/31/2018  . Distal radius fracture, left 01/21/2016    Brylan Seubert, PTA 06/06/2018, 11:51 AM  [facility] [address] [place], [place], [address] Phone: [phone]   Fax:  [phone]  Name: Kylie Christensen MRN: [id number] Date of Birth: Jun 16, 1941

## 2018-06-09 ENCOUNTER — Ambulatory Visit: Payer: PPO

## 2018-06-09 DIAGNOSIS — M25561 Pain in right knee: Principal | ICD-10-CM

## 2018-06-09 DIAGNOSIS — R600 Localized edema: Secondary | ICD-10-CM

## 2018-06-09 DIAGNOSIS — M25661 Stiffness of right knee, not elsewhere classified: Secondary | ICD-10-CM

## 2018-06-09 DIAGNOSIS — M6281 Muscle weakness (generalized): Secondary | ICD-10-CM

## 2018-06-09 DIAGNOSIS — G8929 Other chronic pain: Secondary | ICD-10-CM

## 2018-06-09 DIAGNOSIS — R2689 Other abnormalities of gait and mobility: Secondary | ICD-10-CM

## 2018-06-09 NOTE — Therapy (Signed)
[facility] [address], [place], [place], [address] Phone: [phone]   Fax:  [phone]  Physical Therapy Treatment  Patient Details  Name: Kylie Christensen MRN: [id number] Date of Birth: 01-04-1941 Referring Provider: Joni Fears, MD   Encounter Date: 06/09/2018 Progress Note Reporting Period 05/15/18 to 06/09/18  See note below for Objective Data and Assessment of Progress/Goals.      PT End of Session - 06/09/18 1227    Visit Number  10    Authorization Type  Medicare    PT Start Time  1146    PT Stop Time  1241    PT Time Calculation (min)  55 min    Activity Tolerance  Patient tolerated treatment well    Behavior During Therapy  [facility] for tasks assessed/performed       Past Medical History:  Diagnosis Date  . Anxiety   . Colitis   . GERD (gastroesophageal reflux disease)   . Headache   . Hypothyroidism     Past Surgical History:  Procedure Laterality Date  . ABDOMINAL HYSTERECTOMY     partial  . BACK SURGERY  nov 2011   lower back  . BALLOON DILATION N/A 01/25/2015   Procedure: BALLOON DILATION;  Surgeon: Garlan Fair, MD;  Location: [facility];  Service: Endoscopy;  Laterality: N/A;  . BIOPSY BREAST Left yrs ago   benign  . COLONOSCOPY WITH PROPOFOL N/A 01/25/2015   Procedure: COLONOSCOPY WITH PROPOFOL;  Surgeon: Garlan Fair, MD;  Location: [facility];  Service: Endoscopy;  Laterality: N/A;  . ESOPHAGOGASTRODUODENOSCOPY (EGD) WITH PROPOFOL N/A 01/25/2015   Procedure: ESOPHAGOGASTRODUODENOSCOPY (EGD) WITH PROPOFOL;  Surgeon: Garlan Fair, MD;  Location: [facility];  Service: Endoscopy;  Laterality: N/A;  . KNEE SURGERY    . OPEN REDUCTION INTERNAL FIXATION (ORIF) DISTAL RADIAL FRACTURE Left 01/21/2016   Procedure: OPEN REDUCTION INTERNAL FIXATION (ORIF) DISTAL RADIAL FRACTURE;  Surgeon: Iran Planas, MD;  Location: [facility];  Service: Orthopedics;  Laterality: Left;  . rotator  cuffr Right   . SHOULDER ARTHROSCOPY    . thyroid nodule removed  1970  . TONSILLECTOMY  age 4  . WRIST FRACTURE SURGERY      There were no vitals filed for this visit.  Subjective Assessment - 06/09/18 1152    Subjective  I was doing good with my stretch and yesterday was more stiff.      Pertinent History  Rt patellar fracture: ORIF surgery 04/03/18    Patient Stated Goals  wean from crutches, improve gait, improve strength and endurance    Currently in Pain?  Yes    Pain Score  5     Pain Location  Knee    Pain Orientation  Right    Pain Descriptors / Indicators  Tightness;Sore    Pain Onset  More than a month ago    Pain Frequency  Constant    Aggravating Factors   constant, bending the knee    Pain Relieving Factors  medication, ice, rest         [facility] PT Assessment - 06/09/18 0001      Assessment   Medical Diagnosis  chronic pain of Rt knee. S/p patellar ORIF 04/03/18      Observation/Other Assessments   Focus on Therapeutic Outcomes (FOTO)   49% limitation      AROM   Right Knee Extension  -5    Right Knee Flexion  109  [facility] Adult PT Treatment/Exercise - 06/09/18 0001      Ambulation/Gait   Ambulation/Gait  Yes    Ambulation/Gait Assistance  6: Modified independent (Device/Increase time)    Assistive device  Straight cane    Gait Pattern  Step-through pattern;Decreased stride length    Ambulation Surface  Level    Gait Comments  gait training with use of cane with emphasis on weightbearing on the Rt for stability      Knee/Hip Exercises: Stretches   Active Hamstring Stretch  Right;3 reps;20 seconds      Knee/Hip Exercises: Aerobic   Stationary Bike  for ROM: flexion x 8 minutes    Nustep  --      Knee/Hip Exercises: Standing   Knee Flexion  AAROM;Right;1 set;10 reps using step to stretch    Forward Step Up  Right;1 set;10 reps;Hand Hold: 2;Step Height: 4" VC to not circumduct    Rebounder  weight shifting 3 ways 1 min each       Knee/Hip Exercises: Seated   Long Arc Quad  Strengthening;Right;2 sets;10 reps;Weights quad lag and weakness noted at end range      Vasopneumatic   Number Minutes Vasopneumatic   15 minutes    Vasopnuematic Location   Knee    Vasopneumatic Pressure  Medium    Vasopneumatic Temperature   3 snowflakes               PT Short Term Goals - 06/09/18 1153      PT SHORT TERM GOAL #1   Title  be independent in initial HEP    Status  Achieved      PT SHORT TERM GOAL #2   Title  improve Rt LE strength to wean to 1 crutch for all distances    Status  Achieved      PT SHORT TERM GOAL #3   Title  demonstrate Rt knee A/ROM flexion to > or = to 95 degrees to allow for sitting without limitation     Status  Achieved      PT SHORT TERM GOAL #4   Title  reduce Rt knee ambulate for > or = to 20 minutes in the community to improve independence    Baseline  10 minutes to walk to dining room     Time  4    Period  Weeks    Status  On-going        PT Long Term Goals - 06/09/18 1154      PT LONG TERM GOAL #1   Title  be independent in advanced HEP    Time  8    Period  Weeks    Status  On-going      PT LONG TERM GOAL #2   Title  wean from crutches and demonstrate symmetry with ambulation on level surfaces    Baseline  pt continues to walk with 1 crutch for safety    Time  8    Period  Weeks    Status  On-going      PT LONG TERM GOAL #3   Title  demonstrate Rt knee A/ROM flexion to > or = to 110 degrees to improve squatting, negotiating steps and driving    Baseline  108 last session    Time  8    Status  On-going      PT LONG TERM GOAL #4   Title  reduce FOTO to < or = to 48% limitation    Baseline  49% limitation    Time  8    Period  Weeks    Status  On-going      PT LONG TERM GOAL #5   Title  improve Rt LE strength to walk for > or = to 30 minutes without limitation    Baseline  10 minutes    Time  8    Period  Weeks    Status  On-going             Plan - 06/09/18 1157    Clinical Impression Statement  Pt is making steady gains s/p ORIF for Rt patellar fracture.  Pt with improved Rt knee A/ROM into flexion and extension. Pt with continued Rt knee pain (5/10 on average) and edema consistent with post-op condition.  Pt with slow ambulation with 1 crutch for stability and is limited to walking 10 minutes due to fatigue and pain.  Pt with quad lag with long arc quad and demonstrates Rt knee functional strength deficits.  Pt will continue to benefit from skilled PT for Rt LE strength, flexibility, endurance and edema management.      Rehab Potential  Good    PT Frequency  3x / week    PT Duration  8 weeks    PT Treatment/Interventions  ADLs/Self Care Home Management;Cryotherapy;Electrical Stimulation;Moist Heat;Gait training;Stair training;Functional mobility training;Therapeutic activities;Therapeutic exercise;Neuromuscular re-education;Patient/family education;Passive range of motion;Scar mobilization;Manual techniques;Taping;Vasopneumatic Device    PT Next Visit Plan  Hip and knee strength and knee AROM    PT Home Exercise Plan  Access Code: [email or web address]    Consulted and Agree with Plan of Care  Patient       Patient will benefit from skilled therapeutic intervention in order to improve the following deficits and impairments:  Abnormal gait, Pain, Decreased scar mobility, Decreased strength, Decreased range of motion, Decreased endurance, Decreased activity tolerance, Increased edema, Impaired flexibility, Difficulty walking  Visit Diagnosis: Chronic pain of right knee  Stiffness of right knee, not elsewhere classified  Muscle weakness (generalized)  Other abnormalities of gait and mobility  Localized edema     Problem List Patient Active Problem List   Diagnosis Date Noted  . Closed displaced comminuted fracture of right patella 03/31/2018  . Distal radius fracture, left 01/21/2016    Sigurd Sos, PT 06/09/18  12:30 PM  [facility] [address] [place], [place], [address] Phone: [phone]   Fax:  [phone]  Name: Kylie Christensen MRN: [id number] Date of Birth: 12-Jun-1941

## 2018-06-11 ENCOUNTER — Encounter: Payer: Self-pay | Admitting: Physical Therapy

## 2018-06-11 ENCOUNTER — Ambulatory Visit: Payer: PPO | Admitting: Physical Therapy

## 2018-06-11 DIAGNOSIS — M25661 Stiffness of right knee, not elsewhere classified: Secondary | ICD-10-CM

## 2018-06-11 DIAGNOSIS — G8929 Other chronic pain: Secondary | ICD-10-CM

## 2018-06-11 DIAGNOSIS — R2689 Other abnormalities of gait and mobility: Secondary | ICD-10-CM

## 2018-06-11 DIAGNOSIS — M6281 Muscle weakness (generalized): Secondary | ICD-10-CM

## 2018-06-11 DIAGNOSIS — M25561 Pain in right knee: Secondary | ICD-10-CM | POA: Diagnosis not present

## 2018-06-11 DIAGNOSIS — R600 Localized edema: Secondary | ICD-10-CM

## 2018-06-11 NOTE — Therapy (Signed)
[facility] [address], [place], [place], [address] Phone: [phone]   Fax:  [phone]  Physical Therapy Treatment  Patient Details  Name: Kylie Christensen MRN: [id number] Date of Birth: [birth year] Referring Provider: Joni Fears, MD   Encounter Date: 06/11/2018  PT End of Session - 06/11/18 1150    Visit Number  11    Authorization Type  Medicare    PT Start Time  1150    PT Stop Time  1231    PT Time Calculation (min)  41 min    Activity Tolerance  Patient limited by pain    Behavior During Therapy  Anxious worried about her pain and swelling       Past Medical History:  Diagnosis Date  . Anxiety   . Colitis   . GERD (gastroesophageal reflux disease)   . Headache   . Hypothyroidism     Past Surgical History:  Procedure Laterality Date  . ABDOMINAL HYSTERECTOMY     partial  . BACK SURGERY  nov 2011   lower back  . BALLOON DILATION N/A 01/25/2015   Procedure: BALLOON DILATION;  Surgeon: Garlan Fair, MD;  Location: [facility];  Service: Endoscopy;  Laterality: N/A;  . BIOPSY BREAST Left yrs ago   benign  . COLONOSCOPY WITH PROPOFOL N/A 01/25/2015   Procedure: COLONOSCOPY WITH PROPOFOL;  Surgeon: Garlan Fair, MD;  Location: [facility];  Service: Endoscopy;  Laterality: N/A;  . ESOPHAGOGASTRODUODENOSCOPY (EGD) WITH PROPOFOL N/A 01/25/2015   Procedure: ESOPHAGOGASTRODUODENOSCOPY (EGD) WITH PROPOFOL;  Surgeon: Garlan Fair, MD;  Location: [facility];  Service: Endoscopy;  Laterality: N/A;  . KNEE SURGERY    . OPEN REDUCTION INTERNAL FIXATION (ORIF) DISTAL RADIAL FRACTURE Left 01/21/2016   Procedure: OPEN REDUCTION INTERNAL FIXATION (ORIF) DISTAL RADIAL FRACTURE;  Surgeon: Iran Planas, MD;  Location: [facility];  Service: Orthopedics;  Laterality: Left;  . rotator cuffr Right   . SHOULDER ARTHROSCOPY    . thyroid nodule removed  1970  . TONSILLECTOMY  age 77  . WRIST FRACTURE SURGERY       There were no vitals filed for this visit.  Subjective Assessment - 06/11/18 1154    Subjective  I woke up hurting this AM. My knee is vey swollen today and I am having a hard time bending my knee. Pt is ambulating on 2 axillary crutches this AM.    Pertinent History  Rt patellar fracture: ORIF surgery 04/03/18    Currently in Pain?  Yes    Pain Score  6     Pain Location  Knee    Pain Orientation  Right    Pain Descriptors / Indicators  Sore;Tightness;Constant;Pressure    Multiple Pain Sites  No         [facility] PT Assessment - 06/11/18 0001      AROM   Right Knee Flexion  105                   [facility] Adult PT Treatment/Exercise - 06/11/18 0001      Knee/Hip Exercises: Aerobic   Nustep  L1 x 5 min slow PTA present to monitor pain      Vasopneumatic   Vasopnuematic Location   Knee    Vasopneumatic Pressure  Medium    Vasopneumatic Temperature   3 snowflakes      Manual Therapy   Edema Management  Retrograde massage, some light lymphatic soft tissue work to [employer]  Joint Mobilization  Patella mobs in 4 directions Painful     Soft tissue mobilization  Instrument asst mobs around lateral knee, joint line, peripatella               PT Short Term Goals - 06/09/18 1153      PT SHORT TERM GOAL #1   Title  be independent in initial HEP    Status  Achieved      PT SHORT TERM GOAL #2   Title  improve Rt LE strength to wean to 1 crutch for all distances    Status  Achieved      PT SHORT TERM GOAL #3   Title  demonstrate Rt knee A/ROM flexion to > or = to 95 degrees to allow for sitting without limitation     Status  Achieved      PT SHORT TERM GOAL #4   Title  reduce Rt knee ambulate for > or = to 20 minutes in the community to improve independence    Baseline  10 minutes to walk to dining room     Time  4    Period  Weeks    Status  On-going        PT Long Term Goals - 06/09/18 1154      PT LONG TERM GOAL #1   Title  be independent in  advanced HEP    Time  8    Period  Weeks    Status  On-going      PT LONG TERM GOAL #2   Title  wean from crutches and demonstrate symmetry with ambulation on level surfaces    Baseline  pt continues to walk with 1 crutch for safety    Time  8    Period  Weeks    Status  On-going      PT LONG TERM GOAL #3   Title  demonstrate Rt knee A/ROM flexion to > or = to 110 degrees to improve squatting, negotiating steps and driving    Baseline  108 last session    Time  8    Status  On-going      PT LONG TERM GOAL #4   Title  reduce FOTO to < or = to 48% limitation    Baseline  49% limitation    Time  8    Period  Weeks    Status  On-going      PT LONG TERM GOAL #5   Title  improve Rt LE strength to walk for > or = to 30 minutes without limitation    Baseline  10 minutes    Time  8    Period  Weeks    Status  On-going            Plan - 06/11/18 1151    Clinical Impression Statement  Pt presented today ambulating on 2 axillary crutches due to increased pain and swelling. Pt moving very slowly and reports it has been hard to bend her knee all morning.  Patella mobs very painful today, superior mobs most painful.  Tretament was focused on reducing pain and swelling. She has appt to see MD on Monday. pt could only bend her knee to 105 degrees today.     Rehab Potential  Good    PT Frequency  3x / week    PT Duration  8 weeks    PT Treatment/Interventions  ADLs/Self Care Home Management;Cryotherapy;Electrical Stimulation;Moist Heat;Gait training;Stair training;Functional mobility training;Therapeutic activities;Therapeutic  exercise;Neuromuscular re-education;Patient/family education;Passive range of motion;Scar mobilization;Manual techniques;Taping;Vasopneumatic Device    PT Next Visit Plan  Hip and knee strength and knee AROM, see if pain better.     PT Home Exercise Plan  Access Code: [email or web address]    Consulted and Agree with Plan of Care  Patient       Patient will benefit from  skilled therapeutic intervention in order to improve the following deficits and impairments:  Abnormal gait, Pain, Decreased scar mobility, Decreased strength, Decreased range of motion, Decreased endurance, Decreased activity tolerance, Increased edema, Impaired flexibility, Difficulty walking  Visit Diagnosis: Chronic pain of right knee  Stiffness of right knee, not elsewhere classified  Muscle weakness (generalized)  Other abnormalities of gait and mobility  Localized edema     Problem List Patient Active Problem List   Diagnosis Date Noted  . Closed displaced comminuted fracture of right patella 03/31/2018  . Distal radius fracture, left 01/21/2016    Sharissa Brierley, PTA 06/11/2018, 12:23 PM  [facility] [address], [place], [place], [address] Phone: [phone]   Fax:  [phone]  Name: Kylie Christensen MRN: [id number] Date of Birth: September 28, 1941

## 2018-06-12 ENCOUNTER — Telehealth (INDEPENDENT_AMBULATORY_CARE_PROVIDER_SITE_OTHER): Payer: Self-pay | Admitting: Orthopaedic Surgery

## 2018-06-12 NOTE — Telephone Encounter (Signed)
Patient called stating her right knee is still very painful.  Patient has appointment on 06/16/18 at 10:30 with Dr. Durward Fortes, but is requesting a prescription refill of her Hydrocodone to be sent to [facility].   Patient is aware that Dr. Durward Fortes is not in the office until 06/16/18.

## 2018-06-12 NOTE — Telephone Encounter (Signed)
Please advise. Thank you

## 2018-06-13 ENCOUNTER — Ambulatory Visit: Payer: PPO | Admitting: Physical Therapy

## 2018-06-13 ENCOUNTER — Encounter: Payer: Self-pay | Admitting: Physical Therapy

## 2018-06-13 DIAGNOSIS — R600 Localized edema: Secondary | ICD-10-CM

## 2018-06-13 DIAGNOSIS — M25661 Stiffness of right knee, not elsewhere classified: Secondary | ICD-10-CM

## 2018-06-13 DIAGNOSIS — M25561 Pain in right knee: Principal | ICD-10-CM

## 2018-06-13 DIAGNOSIS — M6281 Muscle weakness (generalized): Secondary | ICD-10-CM

## 2018-06-13 DIAGNOSIS — R2689 Other abnormalities of gait and mobility: Secondary | ICD-10-CM

## 2018-06-13 DIAGNOSIS — G8929 Other chronic pain: Secondary | ICD-10-CM

## 2018-06-13 NOTE — Therapy (Signed)
[facility] [address], [place], [place], [address] Phone: [phone]   Fax:  [phone]  Physical Therapy Treatment  Patient Details  Name: Kylie Christensen MRN: [id number] Date of Birth: [birth year] Referring Provider: Joni Fears, MD   Encounter Date: 06/13/2018  PT End of Session - 06/13/18 1102    Visit Number  12    Date for PT Re-Evaluation  07/10/18    Authorization Type  Medicare    PT Start Time  1058    PT Stop Time  1200    PT Time Calculation (min)  62 min    Activity Tolerance  Patient limited by pain    Behavior During Therapy  Anxious Anxious bc of pain, worried fx not healing       Past Medical History:  Diagnosis Date  . Anxiety   . Colitis   . GERD (gastroesophageal reflux disease)   . Headache   . Hypothyroidism     Past Surgical History:  Procedure Laterality Date  . ABDOMINAL HYSTERECTOMY     partial  . BACK SURGERY  nov 2011   lower back  . BALLOON DILATION N/A 01/25/2015   Procedure: BALLOON DILATION;  Surgeon: Garlan Fair, MD;  Location: [facility];  Service: Endoscopy;  Laterality: N/A;  . BIOPSY BREAST Left yrs ago   benign  . COLONOSCOPY WITH PROPOFOL N/A 01/25/2015   Procedure: COLONOSCOPY WITH PROPOFOL;  Surgeon: Garlan Fair, MD;  Location: [facility];  Service: Endoscopy;  Laterality: N/A;  . ESOPHAGOGASTRODUODENOSCOPY (EGD) WITH PROPOFOL N/A 01/25/2015   Procedure: ESOPHAGOGASTRODUODENOSCOPY (EGD) WITH PROPOFOL;  Surgeon: Garlan Fair, MD;  Location: [facility];  Service: Endoscopy;  Laterality: N/A;  . KNEE SURGERY    . OPEN REDUCTION INTERNAL FIXATION (ORIF) DISTAL RADIAL FRACTURE Left 01/21/2016   Procedure: OPEN REDUCTION INTERNAL FIXATION (ORIF) DISTAL RADIAL FRACTURE;  Surgeon: Iran Planas, MD;  Location: [facility];  Service: Orthopedics;  Laterality: Left;  . rotator cuffr Right   . SHOULDER ARTHROSCOPY    . thyroid nodule removed  1970  .  TONSILLECTOMY  age 18  . WRIST FRACTURE SURGERY      There were no vitals filed for this visit.  Subjective Assessment - 06/13/18 1103    Subjective  Pain about the same, but swelling is slightly better than on Wednesday.    Currently in Pain?  Yes    Pain Score  5     Pain Location  Knee    Pain Orientation  Right    Pain Descriptors / Indicators  Sore;Tightness;Spasm    Aggravating Factors   Been constant    Pain Relieving Factors  medication, ice, rest    Multiple Pain Sites  No         [facility] PT Assessment - 06/13/18 0001      Figure 8 Edema   Figure 8 - Right   -- Center of patella:16.5, 1 inch superior of patella:18.5 in    Figure 8 - Left   -- Center of patella: 15 in  1 in above patella 16 in      AROM   Right Knee Extension  4    Right Knee Flexion  105                   [facility] Adult PT Treatment/Exercise - 06/13/18 0001      Knee/Hip Exercises: Aerobic   Nustep  L2 x10 min verbal cues for quad activation  Knee/Hip Exercises: Supine   Heel Slides  AAROM;Right;1 set;10 reps      Vasopneumatic   Number Minutes Vasopneumatic   15 minutes    Vasopnuematic Location   Knee    Vasopneumatic Pressure  Medium    Vasopneumatic Temperature   3 snowflakes      Manual Therapy   Joint Mobilization  Patella mobs in 4 directions Painful                PT Short Term Goals - 06/13/18 1118      PT SHORT TERM GOAL #1   Title  be independent in initial HEP    Time  4    Period  Weeks    Status  Achieved      PT SHORT TERM GOAL #4   Title  reduce Rt knee ambulate for > or = to 20 minutes in the community to improve independence    Baseline  10 minutes to walk to dining room     Time  4    Period  Weeks    Status  -- It takes longer to walk the same distance.        PT Long Term Goals - 06/09/18 1154      PT LONG TERM GOAL #1   Title  be independent in advanced HEP    Time  8    Period  Weeks    Status  On-going      PT LONG TERM  GOAL #2   Title  wean from crutches and demonstrate symmetry with ambulation on level surfaces    Baseline  pt continues to walk with 1 crutch for safety    Time  8    Period  Weeks    Status  On-going      PT LONG TERM GOAL #3   Title  demonstrate Rt knee A/ROM flexion to > or = to 110 degrees to improve squatting, negotiating steps and driving    Baseline  108 last session    Time  8    Status  On-going      PT LONG TERM GOAL #4   Title  reduce FOTO to < or = to 48% limitation    Baseline  49% limitation    Time  8    Period  Weeks    Status  On-going      PT LONG TERM GOAL #5   Title  improve Rt LE strength to walk for > or = to 30 minutes without limitation    Baseline  10 minutes    Time  8    Period  Weeks    Status  On-going            Plan - 06/13/18 1115    Clinical Impression Statement  Swelling slightly better than Wednesday appt. Pain is about the same: failrly constant but this has increased over the last 1-2 weeks. She reports spasms in the lower leg last night keeping her from resting at night; at times she reports it felt like her whole leg was in a spasm. She has had to use 2 axillary crutches at the beginning of the week, today back down to 1 crutch. Pain limits her speed of ambulation and  ability to bend her knee.  Knee flexion remains 105 active in supine.     Rehab Potential  Good    PT Frequency  3x / week    PT Duration  8 weeks  PT Treatment/Interventions  ADLs/Self Care Home Management;Cryotherapy;Electrical Stimulation;Moist Heat;Gait training;Stair training;Functional mobility training;Therapeutic activities;Therapeutic exercise;Neuromuscular re-education;Patient/family education;Passive range of motion;Scar mobilization;Manual techniques;Taping;Vasopneumatic Device    PT Next Visit Plan  To MD Monday    PT Home Exercise Plan  Access Code: [email or web address]    Consulted and Agree with Plan of Care  Patient       Patient will benefit from skilled  therapeutic intervention in order to improve the following deficits and impairments:  Abnormal gait, Pain, Decreased scar mobility, Decreased strength, Decreased range of motion, Decreased endurance, Decreased activity tolerance, Increased edema, Impaired flexibility, Difficulty walking  Visit Diagnosis: Chronic pain of right knee  Stiffness of right knee, not elsewhere classified  Muscle weakness (generalized)  Other abnormalities of gait and mobility  Localized edema     Problem List Patient Active Problem List   Diagnosis Date Noted  . Closed displaced comminuted fracture of right patella 03/31/2018  . Distal radius fracture, left 01/21/2016    COCHRAN,JENNIFER, PTA 06/13/2018, 11:47 AM  [facility] [address] [place], [place], [address] Phone: [phone]   Fax:  [phone]  Name: Kylie Christensen MRN: [id number] Date of Birth: January 25, 1941

## 2018-06-16 ENCOUNTER — Encounter: Payer: Self-pay | Admitting: Physical Therapy

## 2018-06-16 ENCOUNTER — Encounter (INDEPENDENT_AMBULATORY_CARE_PROVIDER_SITE_OTHER): Payer: Self-pay | Admitting: Orthopaedic Surgery

## 2018-06-16 ENCOUNTER — Ambulatory Visit (INDEPENDENT_AMBULATORY_CARE_PROVIDER_SITE_OTHER): Payer: PPO | Admitting: Orthopaedic Surgery

## 2018-06-16 ENCOUNTER — Ambulatory Visit: Payer: PPO | Attending: Orthopaedic Surgery | Admitting: Physical Therapy

## 2018-06-16 VITALS — BP 106/69 | HR 82 | Ht 63.5 in | Wt 175.0 lb

## 2018-06-16 DIAGNOSIS — G8929 Other chronic pain: Secondary | ICD-10-CM | POA: Diagnosis not present

## 2018-06-16 DIAGNOSIS — R2689 Other abnormalities of gait and mobility: Secondary | ICD-10-CM

## 2018-06-16 DIAGNOSIS — S82032G Displaced transverse fracture of left patella, subsequent encounter for closed fracture with delayed healing: Secondary | ICD-10-CM

## 2018-06-16 DIAGNOSIS — M6281 Muscle weakness (generalized): Secondary | ICD-10-CM | POA: Diagnosis not present

## 2018-06-16 DIAGNOSIS — R600 Localized edema: Secondary | ICD-10-CM

## 2018-06-16 DIAGNOSIS — M25661 Stiffness of right knee, not elsewhere classified: Secondary | ICD-10-CM | POA: Diagnosis not present

## 2018-06-16 DIAGNOSIS — M25561 Pain in right knee: Secondary | ICD-10-CM | POA: Insufficient documentation

## 2018-06-16 MED ORDER — GABAPENTIN 100 MG PO CAPS: ORAL_CAPSULE | ORAL | 0 refills | Status: DC

## 2018-06-16 MED ORDER — HYDROCODONE-ACETAMINOPHEN 5-325 MG PO TABS: ORAL_TABLET | ORAL | 0 refills | Status: DC

## 2018-06-16 NOTE — Progress Notes (Signed)
Office Visit Note   Patient: Kylie Christensen           Date of Birth: 1941/12/07           MRN: [id number] Visit Date: 06/16/2018              Requested by: Kylie Huddle, MD [address] [place] [address] [place], [place] [address] PCP: Kylie Huddle, MD   Assessment & Plan: Visit Diagnoses:  1. Displaced transverse fracture of left patella, subsequent encounter for closed fracture with delayed healing     Plan: 10 weeks status post ORIF right patella fracture.  Still having some discomfort using a single crutch.  Continues with physical therapy several times per week.  Will renew prescription for hydrocodone.  Will try low-dose gabapentin continue with range of motion and strengthening exercises. progress to full weightbearing as tolerated.  Office 3 weeks-progress has been slow  Follow-Up Instructions: Return in about 3 weeks (around 07/07/2018).   Orders:  No orders of the defined types were placed in this encounter.  Meds ordered this encounter  Medications  . HYDROcodone-acetaminophen (NORCO/VICODIN) 5-325 MG tablet    Sig: TAKE 1 TABLET EVERY 4-6 HRS PRN    Dispense:  30 tablet    Refill:  0  . gabapentin (NEURONTIN) 100 MG capsule    Sig: TAKE 1 TAB QHS    Dispense:  30 capsule    Refill:  0      Procedures: No procedures performed   Clinical Data: No additional findings.   Subjective: Chief Complaint  Patient presents with  . Post-op Follow-up    04/03/18 R KNEE PAIN STILL HAVING LOTS OF PAIN AND TROUBLE SLEEPING  Kylie Christensen is 10 weeks status post ORIF right patella fracture.  Still having some discomfort.  She notes she has some swelling as the day progresses.  Has been receiving physical therapy several times a week.  Has progressed to using a single crutch in the opposite hand.  No fever or chills.  Had some trouble sleeping at night but still takes an occasional hydrocodone.  Has difficulty with NSAIDs with stomach discomfort  HPI  Review of Systems    Constitutional: Positive for fatigue. Negative for fever.  HENT: Negative for ear pain.   Eyes: Negative for pain.  Respiratory: Negative for cough and shortness of breath.   Cardiovascular: Positive for leg swelling.  Gastrointestinal: Positive for diarrhea. Negative for constipation.  Genitourinary: Negative for difficulty urinating.  Musculoskeletal: Negative for back pain and neck pain.  Skin: Negative for rash.  Allergic/Immunologic: Positive for food allergies.  Neurological: Positive for weakness. Negative for numbness.  Hematological: Bruises/bleeds easily.  Psychiatric/Behavioral: Positive for sleep disturbance.     Objective: Vital Signs: BP 106/69 (BP Location: Left Arm, Patient Position: Sitting, Cuff Size: Normal)   Pulse 82   Ht 5' 3.5" (1.613 m)   Wt 175 lb (79.4 kg)   BMI 30.51 kg/m   Physical Exam  Ortho Exam awake alert and oriented x3.  Comfortable sitting.  No obvious distress.  Ambulates with a single crutch in the left hand.  Full extension right knee.  Effusion.  Incision is healing without any problems.  No instability.  No patellar crepitation.  Minimal swelling about the knee.  No pain with range of motion of her hip.  Vascular exam intact distally.  Specialty Comments:  No specialty comments available.  Imaging: No results found.   PMFS History: Patient Active Problem List   Diagnosis  Date Noted  . Closed displaced comminuted fracture of right patella 03/31/2018  . Distal radius fracture, left 01/21/2016   Past Medical History:  Diagnosis Date  . Anxiety   . Colitis   . GERD (gastroesophageal reflux disease)   . Headache   . Hypothyroidism     History reviewed. No pertinent family history.  Past Surgical History:  Procedure Laterality Date  . ABDOMINAL HYSTERECTOMY     partial  . BACK SURGERY  nov 2011   lower back  . BALLOON DILATION N/A 01/25/2015   Procedure: BALLOON DILATION;  Surgeon: Garlan Fair, MD;  Location: [facility];  Service: Endoscopy;  Laterality: N/A;  . BIOPSY BREAST Left yrs ago   benign  . COLONOSCOPY WITH PROPOFOL N/A 01/25/2015   Procedure: COLONOSCOPY WITH PROPOFOL;  Surgeon: Garlan Fair, MD;  [facility]: [facility];  Service: Endoscopy;  Laterality: N/A;  . ESOPHAGOGASTRODUODENOSCOPY (EGD) WITH PROPOFOL N/A 01/25/2015   Procedure: ESOPHAGOGASTRODUODENOSCOPY (EGD) WITH PROPOFOL;  Surgeon: Garlan Fair, MD;  Location: [facility];  Service: Endoscopy;  Laterality: N/A;  . KNEE SURGERY    . OPEN REDUCTION INTERNAL FIXATION (ORIF) DISTAL RADIAL FRACTURE Left 01/21/2016   Procedure: OPEN REDUCTION INTERNAL FIXATION (ORIF) DISTAL RADIAL FRACTURE;  Surgeon: Iran Planas, MD;  Location: [facility];  Service: Orthopedics;  Laterality: Left;  . rotator cuffr Right   . SHOULDER ARTHROSCOPY    . thyroid nodule removed  1970  . TONSILLECTOMY  age 31  . WRIST FRACTURE SURGERY     Social History   Occupational History  . Not on file  Tobacco Use  . Smoking status: Never Smoker  . Smokeless tobacco: Current User    Types: Chew  Substance and Sexual Activity  . Alcohol use: Yes    Comment: occasional  . Drug use: No  . Sexual activity: Not on file

## 2018-06-16 NOTE — Therapy (Signed)
[facility] [address] [place], [place], [address] Phone: [phone]   Fax:  [phone]  Physical Therapy Treatment  Patient Details  Name: Kylie Christensen MRN: [id number] Date of Birth: 09-01-1941 Referring Provider: Joni Fears, MD   Encounter Date: 06/16/2018  PT End of Session - 06/16/18 1359    Visit Number  13    Date for PT Re-Evaluation  07/10/18    Authorization Type  Medicare    PT Start Time  3818    PT Stop Time  1440    PT Time Calculation (min)  42 min    Activity Tolerance  Patient limited by pain    Behavior During Therapy  [facility] for tasks assessed/performed       Past Medical History:  Diagnosis Date  . Anxiety   . Colitis   . GERD (gastroesophageal reflux disease)   . Headache   . Hypothyroidism     Past Surgical History:  Procedure Laterality Date  . ABDOMINAL HYSTERECTOMY     partial  . BACK SURGERY  nov 2011   lower back  . BALLOON DILATION N/A 01/25/2015   Procedure: BALLOON DILATION;  Surgeon: Garlan Fair, MD;  Location: [facility];  Service: Endoscopy;  Laterality: N/A;  . BIOPSY BREAST Left yrs ago   benign  . COLONOSCOPY WITH PROPOFOL N/A 01/25/2015   Procedure: COLONOSCOPY WITH PROPOFOL;  Surgeon: Garlan Fair, MD;  [facility]: [facility];  Service: Endoscopy;  Laterality: N/A;  . ESOPHAGOGASTRODUODENOSCOPY (EGD) WITH PROPOFOL N/A 01/25/2015   Procedure: ESOPHAGOGASTRODUODENOSCOPY (EGD) WITH PROPOFOL;  Surgeon: Garlan Fair, MD;  Location: [facility];  Service: Endoscopy;  Laterality: N/A;  . KNEE SURGERY    . OPEN REDUCTION INTERNAL FIXATION (ORIF) DISTAL RADIAL FRACTURE Left 01/21/2016   Procedure: OPEN REDUCTION INTERNAL FIXATION (ORIF) DISTAL RADIAL FRACTURE;  Surgeon: Iran Planas, MD;  Location: [facility];  Service: Orthopedics;  Laterality: Left;  . rotator cuffr Right   . SHOULDER ARTHROSCOPY    . thyroid nodule removed  1970  . TONSILLECTOMY  age 6   . WRIST FRACTURE SURGERY      There were no vitals filed for this visit.  Subjective Assessment - 06/16/18 1402    Subjective  Pt saw MD. Nothing appears structurally wrong. Maybe consider pausing with the therapy for some time.     Pertinent History  Rt patellar fracture: ORIF surgery 04/03/18    Patient Stated Goals  wean from crutches, improve gait, improve strength and endurance    Currently in Pain?  Yes    Pain Score  3     Pain Location  Knee    Pain Orientation  Right    Pain Descriptors / Indicators  Dull    Aggravating Factors   Maybe PT    Pain Relieving Factors  meds, ice, rest    Multiple Pain Sites  No         [facility] PT Assessment - 06/16/18 0001      Observation/Other Assessments   Focus on Therapeutic Outcomes (FOTO)   70% limitation      AROM   Right Knee Extension  4    Right Knee Flexion  105                   [facility] Adult PT Treatment/Exercise - 06/16/18 0001      Knee/Hip Exercises: Aerobic   Nustep  L2 x10 min verbal cues for quad activation, dicussion of  what pt wants       Vasopneumatic   Number Minutes Vasopneumatic   15 minutes    Vasopnuematic Location   Knee    Vasopneumatic Pressure  Medium    Vasopneumatic Temperature   3 snowflakes             PT Education - 06/16/18 1444    Education Details  Review of appropriate things to do at home at pt request.     Person(s) Educated  Patient    Methods  Explanation    Comprehension  Verbalized understanding       PT Short Term Goals - 06/16/18 1405      PT SHORT TERM GOAL #1   Title  be independent in initial HEP    Time  4    Period  Weeks    Status  Achieved      PT SHORT TERM GOAL #2   Title  improve Rt LE strength to wean to 1 crutch for all distances    Time  4    Period  Weeks    Status  Achieved Slow        PT Long Term Goals - 06/16/18 1406      PT LONG TERM GOAL #1   Title  be independent in advanced HEP    Time  8    Period  Weeks    Status   On-going Pt wants to hold on any further exercises at this time and giver her knee some more healing time.       PT LONG TERM GOAL #2   Title  wean from crutches and demonstrate symmetry with ambulation on level surfaces    Baseline  pt continues to walk with 1 crutch for safety    Time  8    Period  Weeks    Status  On-going ambulating slow with 1 axillary crutch      PT LONG TERM GOAL #3   Title  demonstrate Rt knee A/ROM flexion to > or = to 110 degrees to improve squatting, negotiating steps and driving    Time  8    Period  Weeks    Status  On-going 105 degrees      PT LONG TERM GOAL #5   Title  improve Rt LE strength to walk for > or = to 30 minutes without limitation    Time  8    Period  Weeks    Status  On-going 10-15 and very slowly            Plan - 06/16/18 1359    Clinical Impression Statement  Pt has seen MD. MD feels everything is healing well, might be too soon for therapy. Pt and husband would like to take 2 weeks off from PT and concentrate on NWB exercises at home that include the Nustep. She feels at this time she is unable to progress in therapy. Her AROM was 105 degrees flexion and her FOTO score dropped to 70% limitation. No new goals will be met.     Rehab Potential  Good    PT Frequency  3x / week    PT Duration  8 weeks    PT Treatment/Interventions  ADLs/Self Care Home Management;Cryotherapy;Electrical Stimulation;Moist Heat;Gait training;Stair training;Functional mobility training;Therapeutic activities;Therapeutic exercise;Neuromuscular re-education;Patient/family education;Passive range of motion;Scar mobilization;Manual techniques;Taping;Vasopneumatic Device    PT Next Visit Plan  Hold PT for 2 weeks. Pt will call the week of 06/30/18 to let us know  to DC or schedule another appt. Her current order is good through 07/10/18.    PT Home Exercise Plan  Access Code: [email or web address]    Consulted and Agree with Plan of Care  Patient       Patient will benefit  from skilled therapeutic intervention in order to improve the following deficits and impairments:  Abnormal gait, Pain, Decreased scar mobility, Decreased strength, Decreased range of motion, Decreased endurance, Decreased activity tolerance, Increased edema, Impaired flexibility, Difficulty walking  Visit Diagnosis: Chronic pain of right knee  Stiffness of right knee, not elsewhere classified  Muscle weakness (generalized)  Other abnormalities of gait and mobility  Localized edema     Problem List Patient Active Problem List   Diagnosis Date Noted  . Closed displaced comminuted fracture of right patella 03/31/2018  . Distal radius fracture, left 01/21/2016    Dayannara Pascal, PTA 06/16/2018, 2:45 PM  [facility] [address] [place], [place], [address] Phone: [phone]   Fax:  [phone]  Name: NATASSJA OLLIS MRN: [id number] Date of Birth: [birth year]

## 2018-07-02 ENCOUNTER — Other Ambulatory Visit (INDEPENDENT_AMBULATORY_CARE_PROVIDER_SITE_OTHER): Payer: Self-pay | Admitting: Orthopaedic Surgery

## 2018-07-03 NOTE — Telephone Encounter (Signed)
Ok to prescribe

## 2018-07-03 NOTE — Telephone Encounter (Signed)
Please advise 

## 2018-07-07 ENCOUNTER — Encounter (INDEPENDENT_AMBULATORY_CARE_PROVIDER_SITE_OTHER): Payer: Self-pay | Admitting: Orthopaedic Surgery

## 2018-07-07 ENCOUNTER — Ambulatory Visit (INDEPENDENT_AMBULATORY_CARE_PROVIDER_SITE_OTHER): Payer: PPO

## 2018-07-07 ENCOUNTER — Ambulatory Visit (INDEPENDENT_AMBULATORY_CARE_PROVIDER_SITE_OTHER): Payer: PPO | Admitting: Orthopaedic Surgery

## 2018-07-07 VITALS — BP 106/72 | HR 75 | Ht 63.5 in | Wt 175.0 lb

## 2018-07-07 DIAGNOSIS — G8929 Other chronic pain: Secondary | ICD-10-CM | POA: Diagnosis not present

## 2018-07-07 DIAGNOSIS — M25561 Pain in right knee: Secondary | ICD-10-CM

## 2018-07-07 DIAGNOSIS — S82041D Displaced comminuted fracture of right patella, subsequent encounter for closed fracture with routine healing: Secondary | ICD-10-CM

## 2018-07-07 MED ORDER — HYDROCODONE-ACETAMINOPHEN 5-325 MG PO TABS: 1.0000 | ORAL_TABLET | Freq: Four times a day (QID) | ORAL | 0 refills | Status: DC | PRN

## 2018-07-07 MED ORDER — BUPIVACAINE HCL 0.5 % IJ SOLN
2.0000 mL | INTRAMUSCULAR | Status: AC | PRN
  Administered 2018-07-07: 2 mL via INTRA_ARTICULAR

## 2018-07-07 MED ORDER — LIDOCAINE HCL 1 % IJ SOLN
2.0000 mL | INTRAMUSCULAR | Status: AC | PRN
  Administered 2018-07-07: 2 mL

## 2018-07-07 MED ORDER — METHYLPREDNISOLONE ACETATE 40 MG/ML IJ SUSP
80.0000 mg | INTRAMUSCULAR | Status: AC | PRN
  Administered 2018-07-07: 80 mg

## 2018-07-07 NOTE — Addendum Note (Signed)
Addended by: Garald Balding on: 07/07/2018 12:19 PM   Modules accepted: Orders

## 2018-07-07 NOTE — Progress Notes (Signed)
Office Visit Note   Patient: Kylie Christensen           Date of Birth: [birth year]           MRN: [id number] Visit Date: 07/07/2018              Requested by: Kylie Huddle, MD [address] [place], [place] [address] PCP: Kylie Huddle, MD   Assessment & Plan: Visit Diagnoses:  1. Chronic pain of right knee   2. Closed displaced comminuted fracture of right patella with routine healing, subsequent encounter     Plan: Continue exercises right knee for strengthening.  Cortisone injection for diagnostic and therapeutic purposes.  I think some of her pain is related to the arthritis.  Might have some pain referable to one or both of the threaded screws.  Consider removing these in the next month.  Consider renewing hydrocodone depending upon her pain and continue with the Voltaren gel  Follow-Up Instructions: No follow-ups on file.   Orders:  Orders Placed This Encounter  Procedures  . XR KNEE 3 VIEW RIGHT   No orders of the defined types were placed in this encounter.     Procedures: Large Joint Inj: R knee on 07/07/2018 12:06 PM Indications: pain and diagnostic evaluation Details: 25 G 1.5 in needle, anterolateral approach  Arthrogram: No  Medications: 2 mL bupivacaine 0.5 %; 2 mL lidocaine 1 %; 80 mg methylPREDNISolone acetate 40 MG/ML Procedure, treatment alternatives, risks and benefits explained, specific risks discussed. Consent was given by the patient. Immediately prior to procedure a time out was called to verify the correct patient, procedure, equipment, support staff and site/side marked as required. Patient was prepped and draped in the usual sterile fashion.       Clinical Data: No additional findings.   Subjective: Chief Complaint  Patient presents with  . Follow-up    04/03/18 R KNEE/PATELLA SURGERY STILL HAVING SWELLING AND PAIN THAT WAKES FROM SLEEPING STOPPED [T BUT HAS EVALUATION ON WED. GABAPENTIN TOOK 2 TABLETS BUT ONLY HELPS A  LITTLE  Just over 3 months status post open reduction internal fixation comminuted displaced right patella fracture.  Lower pole was more comminuted.  I used to cross the screws and fed FiberWire through the screws in a figure-of-eight fashion.  I then supplemented that with a circumferential FiberWire about the patella.  Kylie Christensen course has been slow.  She seems to be having pain along the medial lateral compartment of her right knee.  Still using a single crutch.  Also experiencing some pain along the medial lateral aspect of her left knee  HPI  Review of Systems  Constitutional: Positive for fatigue. Negative for fever.  HENT: Negative for ear pain.   Eyes: Negative for pain.  Respiratory: Negative for cough and shortness of breath.   Cardiovascular: Positive for leg swelling.  Gastrointestinal: Positive for diarrhea. Negative for constipation.  Genitourinary: Negative for difficulty urinating.  Musculoskeletal: Negative for back pain and neck pain.  Skin: Negative for rash.  Allergic/Immunologic: Negative for food allergies.  Neurological: Positive for weakness. Negative for numbness.  Hematological: Bruises/bleeds easily.  Psychiatric/Behavioral: Positive for sleep disturbance.     Objective: Vital Signs: BP 106/72 (BP Location: Left Arm, Patient Position: Sitting, Cuff Size: Normal)   Pulse 75   Ht 5' 3.5" (1.613 m)   Wt 175 lb (79.4 kg)   BMI 30.51 kg/m   Physical Exam  Constitutional: She is oriented to person, place, and time.  She appears well-developed and well-nourished.  HENT:  Mouth/Throat: Oropharynx is clear and moist.  Eyes: Pupils are equal, round, and reactive to light. EOM are normal.  Pulmonary/Chest: Effort normal.  Neurological: She is alert and oriented to person, place, and time.  Skin: Skin is warm and dry.  Psychiatric: She has a normal mood and affect. Her behavior is normal.    Ortho Exam awake alert and oriented x3 comfortable sitting.  Full  quick right knee extension.  I measured 105 degrees of flexion with a goniometer.  May be for small effusion.  No pain referable to the patella.  There was no palpable pain there was no pain with flexion extension about the patella but did have some pain along the medial lateral compartments.  No distal edema.  Neurovascular exam intact.  Specialty Comments:  No specialty comments available.  Imaging: No results found.   PMFS History: Patient Active Problem List   Diagnosis Date Noted  . Closed displaced comminuted fracture of right patella 03/31/2018  . Distal radius fracture, left 01/21/2016   Past Medical History:  Diagnosis Date  . Anxiety   . Colitis   . GERD (gastroesophageal reflux disease)   . Headache   . Hypothyroidism     History reviewed. No pertinent family history.  Past Surgical History:  Procedure Laterality Date  . ABDOMINAL HYSTERECTOMY     partial  . BACK SURGERY  nov 2011   lower back  . BALLOON DILATION N/A 01/25/2015   Procedure: BALLOON DILATION;  Surgeon: Kylie Fair, MD;  Location: [facility];  Service: Endoscopy;  Laterality: N/A;  . BIOPSY BREAST Left yrs ago   benign  . COLONOSCOPY WITH PROPOFOL N/A 01/25/2015   Procedure: COLONOSCOPY WITH PROPOFOL;  Surgeon: Kylie Fair, MD;  Location: [facility];  Service: Endoscopy;  Laterality: N/A;  . ESOPHAGOGASTRODUODENOSCOPY (EGD) WITH PROPOFOL N/A 01/25/2015   Procedure: ESOPHAGOGASTRODUODENOSCOPY (EGD) WITH PROPOFOL;  Surgeon: Kylie Fair, MD;  Location: [facility];  Service: Endoscopy;  Laterality: N/A;  . KNEE SURGERY    . OPEN REDUCTION INTERNAL FIXATION (ORIF) DISTAL RADIAL FRACTURE Left 01/21/2016   Procedure: OPEN REDUCTION INTERNAL FIXATION (ORIF) DISTAL RADIAL FRACTURE;  Surgeon: Kylie Planas, MD;  Location: [facility];  Service: Orthopedics;  Laterality: Left;  . rotator cuffr Right   . SHOULDER ARTHROSCOPY    . thyroid nodule removed  1970  . TONSILLECTOMY  age 10  . WRIST FRACTURE  SURGERY     Social History   Occupational History  . Not on file  Tobacco Use  . Smoking status: Never Smoker  . Smokeless tobacco: Current User    Types: Chew  Substance and Sexual Activity  . Alcohol use: Yes    Comment: occasional  . Drug use: No  . Sexual activity: Not on file

## 2018-07-09 ENCOUNTER — Other Ambulatory Visit (INDEPENDENT_AMBULATORY_CARE_PROVIDER_SITE_OTHER): Payer: Self-pay | Admitting: Orthopaedic Surgery

## 2018-07-09 ENCOUNTER — Ambulatory Visit: Payer: PPO

## 2018-07-09 DIAGNOSIS — R600 Localized edema: Secondary | ICD-10-CM

## 2018-07-09 DIAGNOSIS — M6281 Muscle weakness (generalized): Secondary | ICD-10-CM

## 2018-07-09 DIAGNOSIS — M25661 Stiffness of right knee, not elsewhere classified: Secondary | ICD-10-CM

## 2018-07-09 DIAGNOSIS — R2689 Other abnormalities of gait and mobility: Secondary | ICD-10-CM

## 2018-07-09 DIAGNOSIS — M25561 Pain in right knee: Principal | ICD-10-CM

## 2018-07-09 DIAGNOSIS — G8929 Other chronic pain: Secondary | ICD-10-CM

## 2018-07-09 NOTE — Telephone Encounter (Signed)
CAN WE REFILL

## 2018-07-09 NOTE — Telephone Encounter (Signed)
yes

## 2018-07-09 NOTE — Therapy (Signed)
[facility] [address], [place], [place], [address] Phone: [phone]   Fax:  [phone]  Physical Therapy Treatment  Patient Details  Name: Kylie Christensen MRN: [id number] Date of Birth: [birth year] Referring Provider: Joni Fears, MD   Encounter Date: 07/09/2018  PT End of Session - 07/09/18 1434    Visit Number  14    Authorization Type  Medicare    PT Start Time  6962    PT Stop Time  1451 pain so limited ability to exercise    PT Time Calculation (min)  49 min    Activity Tolerance  Patient limited by pain    Behavior During Therapy  [facility] [place] [facility] for tasks assessed/performed       Past Medical History:  Diagnosis Date  . Anxiety   . Colitis   . GERD (gastroesophageal reflux disease)   . Headache   . Hypothyroidism     Past Surgical History:  Procedure Laterality Date  . ABDOMINAL HYSTERECTOMY     partial  . BACK SURGERY  nov 2011   lower back  . BALLOON DILATION N/A 01/25/2015   Procedure: BALLOON DILATION;  Surgeon: Garlan Fair, MD;  Location: [facility];  Service: Endoscopy;  Laterality: N/A;  . BIOPSY BREAST Left yrs ago   benign  . COLONOSCOPY WITH PROPOFOL N/A 01/25/2015   Procedure: COLONOSCOPY WITH PROPOFOL;  Surgeon: Garlan Fair, MD;  Location: [facility];  Service: Endoscopy;  Laterality: N/A;  . ESOPHAGOGASTRODUODENOSCOPY (EGD) WITH PROPOFOL N/A 01/25/2015   Procedure: ESOPHAGOGASTRODUODENOSCOPY (EGD) WITH PROPOFOL;  Surgeon: Garlan Fair, MD;  Location: [facility];  Service: Endoscopy;  Laterality: N/A;  . KNEE SURGERY    . OPEN REDUCTION INTERNAL FIXATION (ORIF) DISTAL RADIAL FRACTURE Left 01/21/2016   Procedure: OPEN REDUCTION INTERNAL FIXATION (ORIF) DISTAL RADIAL FRACTURE;  Surgeon: Iran Planas, MD;  Location: [facility];  Service: Orthopedics;  Laterality: Left;  . rotator cuffr Right   . SHOULDER ARTHROSCOPY    . thyroid nodule removed  1970  . TONSILLECTOMY  age 77  .  WRIST FRACTURE SURGERY      There were no vitals filed for this visit.  Subjective Assessment - 07/09/18 1403    Subjective  3 week lapse in treatment.  Pt had cortizone injection yesterday.  I am very stiff and sore.  I continue to have stabbing pains.  MD wants pt to continue to non weightbearing strength exercises either on her own or at PT    Pertinent History  Rt patellar fracture: ORIF surgery 04/03/18    Patient Stated Goals  wean from crutches, improve gait, improve strength and endurance    Currently in Pain?  Yes    Pain Score  7     Pain Location  Knee    Pain Orientation  Right    Pain Descriptors / Indicators  Sore;Tightness;Stabbing    Pain Type  Surgical pain;Chronic pain    Pain Onset  More than a month ago    Pain Frequency  Constant    Aggravating Factors   bending over to put on shoe, squatting    Pain Relieving Factors  rest, ice, meds         [facility] PT Assessment - 07/09/18 0001      Assessment   Medical Diagnosis  chronic pain of Rt knee. S/p patellar ORIF 04/03/18      Cognition   Overall Cognitive Status  Within Functional Limits for tasks assessed  Observation/Other Assessments   Focus on Therapeutic Outcomes (FOTO)   49% limitation      AROM   Right Knee Extension  3    Right Knee Flexion  105      Strength   Overall Strength  Deficits    Right Knee Flexion  4/5    Right Knee Extension  4-/5 quad pain                   [facility] Adult PT Treatment/Exercise - 07/09/18 0001      Knee/Hip Exercises: Aerobic   Nustep  L2 x10 min verbal cues for quad activation, dicussion of HEP        Knee/Hip Exercises: Supine   Heel Slides  AAROM;Right;1 set;10 reps pain so stopped      Vasopneumatic   Number Minutes Vasopneumatic   15 minutes    Vasopnuematic Location   Knee    Vasopneumatic Pressure  Medium    Vasopneumatic Temperature   3 snowflakes             PT Education - 07/09/18 1437    Education Details  discussion of HEP  advancement and how to manage pain    Person(s) Educated  Patient    Methods  Explanation    Comprehension  Verbalized understanding       PT Short Term Goals - 06/16/18 1405      PT SHORT TERM GOAL #1   Title  be independent in initial HEP    Time  4    Period  Weeks    Status  Achieved      PT SHORT TERM GOAL #2   Title  improve Rt LE strength to wean to 1 crutch for all distances    Time  4    Period  Weeks    Status  Achieved Slow        PT Long Term Goals - 07/09/18 1424      PT LONG TERM GOAL #1   Title  be independent in advanced HEP    Status  Achieved      PT LONG TERM GOAL #2   Title  wean from crutches and demonstrate symmetry with ambulation on level surfaces    Baseline  1 crutch and antalgia    Status  Not Met      PT LONG TERM GOAL #3   Title  demonstrate Rt knee A/ROM flexion to > or = to 110 degrees to improve squatting, negotiating steps and driving    Baseline  105    Status  Partially Met      PT LONG TERM GOAL #4   Title  reduce FOTO to < or = to 48% limitation    Baseline  49% limitation    Status  Partially Met      PT LONG TERM GOAL #5   Title  improve Rt LE strength to walk for > or = to 30 minutes without limitation    Baseline  10-15 minutes    Status  Not Met            Plan - 07/09/18 1417    Clinical Impression Statement  Pt had a 3 week lapse in treatment due to Rt knee pain.  Pt received an injection in the Rt knee and will see MD in a month.  Pt reports that MD would like her to continue to perform non weight bearing exercise at home or at PT.  Pt  has opted to work on strength at home and do NuStep.  Pt with persistent sharp pain with all Rt knee motion in the clinic and pt was not able to tolerate much activity. Pt demonstrates antalgia and reduced speed with gait.  PT reviewed all HEP with pt and she verbalized and demonstrated all exercises correctly.  Pt will D/C PT to HEP today and she will follow-up with MD next week.       PT Next Visit Plan  D/C PT to HEP    PT Home Exercise Plan  Access Code: [email or web address]    Consulted and Agree with Plan of Care  Patient       Patient will benefit from skilled therapeutic intervention in order to improve the following deficits and impairments:     Visit Diagnosis: Chronic pain of right knee  Stiffness of right knee, not elsewhere classified  Muscle weakness (generalized)  Localized edema  Other abnormalities of gait and mobility     Problem List Patient Active Problem List   Diagnosis Date Noted  . Closed displaced comminuted fracture of right patella 03/31/2018  . Distal radius fracture, left 01/21/2016   PHYSICAL THERAPY DISCHARGE SUMMARY  Visits from Start of Care: 14  Current functional level related to goals / functional outcomes: See above for current status.  Pt will D/C to HEP due to Rt knee pain with therapy   Remaining deficits: Rt knee: limited A/ROM and strength, antalgic gait, pain.   Education / Equipment: HEP Plan: Patient agrees to discharge.  Patient goals were partially met. Patient is being discharged due to the patient's request.  ?????         Sigurd Sos, PT 07/09/18 2:38 PM  [facility] [address] [place], [place], [address] Phone: [phone]   Fax:  [phone]  Name: Kylie Christensen MRN: [id number] Date of Birth: 08-18-1941

## 2018-07-23 ENCOUNTER — Other Ambulatory Visit: Payer: Self-pay | Admitting: Gastroenterology

## 2018-07-23 DIAGNOSIS — R5382 Chronic fatigue, unspecified: Secondary | ICD-10-CM | POA: Diagnosis not present

## 2018-07-23 DIAGNOSIS — R112 Nausea with vomiting, unspecified: Secondary | ICD-10-CM | POA: Diagnosis not present

## 2018-07-23 DIAGNOSIS — K52831 Collagenous colitis: Secondary | ICD-10-CM | POA: Diagnosis not present

## 2018-07-23 DIAGNOSIS — K529 Noninfective gastroenteritis and colitis, unspecified: Secondary | ICD-10-CM | POA: Diagnosis not present

## 2018-07-28 ENCOUNTER — Other Ambulatory Visit (INDEPENDENT_AMBULATORY_CARE_PROVIDER_SITE_OTHER): Payer: Self-pay | Admitting: Orthopaedic Surgery

## 2018-07-28 DIAGNOSIS — K529 Noninfective gastroenteritis and colitis, unspecified: Secondary | ICD-10-CM | POA: Diagnosis not present

## 2018-07-31 ENCOUNTER — Ambulatory Visit
Admission: RE | Admit: 2018-07-31 | Discharge: 2018-07-31 | Disposition: A | Discharge status: Home / Self Care | Payer: PPO | Source: Ambulatory Visit | Attending: Gastroenterology | Admitting: Gastroenterology

## 2018-07-31 DIAGNOSIS — R112 Nausea with vomiting, unspecified: Secondary | ICD-10-CM

## 2018-08-05 DIAGNOSIS — F439 Reaction to severe stress, unspecified: Secondary | ICD-10-CM | POA: Diagnosis not present

## 2018-08-05 DIAGNOSIS — K52831 Collagenous colitis: Secondary | ICD-10-CM | POA: Diagnosis not present

## 2018-08-05 DIAGNOSIS — M25561 Pain in right knee: Secondary | ICD-10-CM | POA: Diagnosis not present

## 2018-08-05 DIAGNOSIS — G4700 Insomnia, unspecified: Secondary | ICD-10-CM | POA: Diagnosis not present

## 2018-08-05 DIAGNOSIS — E782 Mixed hyperlipidemia: Secondary | ICD-10-CM | POA: Diagnosis not present

## 2018-08-05 DIAGNOSIS — R05 Cough: Secondary | ICD-10-CM | POA: Diagnosis not present

## 2018-08-05 DIAGNOSIS — E559 Vitamin D deficiency, unspecified: Secondary | ICD-10-CM | POA: Diagnosis not present

## 2018-08-05 DIAGNOSIS — R319 Hematuria, unspecified: Secondary | ICD-10-CM | POA: Diagnosis not present

## 2018-08-08 ENCOUNTER — Ambulatory Visit (INDEPENDENT_AMBULATORY_CARE_PROVIDER_SITE_OTHER): Payer: PPO | Admitting: Orthopaedic Surgery

## 2018-08-08 ENCOUNTER — Encounter (INDEPENDENT_AMBULATORY_CARE_PROVIDER_SITE_OTHER): Payer: Self-pay | Admitting: Orthopaedic Surgery

## 2018-08-08 VITALS — BP 104/78 | HR 88 | Ht 63.0 in | Wt 165.0 lb

## 2018-08-08 DIAGNOSIS — S82041D Displaced comminuted fracture of right patella, subsequent encounter for closed fracture with routine healing: Secondary | ICD-10-CM | POA: Diagnosis not present

## 2018-08-08 NOTE — Progress Notes (Signed)
Office Visit Note   Patient: Kylie Christensen           Date of Birth: March 02, 1941           MRN: [id number] Visit Date: 08/08/2018              Requested by: Josetta Huddle, MD [address] [place], [place] [address] PCP: Josetta Huddle, MD   Assessment & Plan: Visit Diagnoses:  1. Closed displaced comminuted fracture of right patella with routine healing, subsequent encounter     Plan: Just over 4 months status post open reduction internal fixation of a comminuted displaced right patella fracture.  Aggressively improving.  Today she drove herself and was not using any ambulatory aid.  Heart is on injection in the right knee last month made a difference.  She is working on exercises at home.  I think she is doing well.  Excellent range of motion and no obvious complications.  Will check again in 6 weeks and consider another injection  Follow-Up Instructions: Return in about 6 weeks (around 09/19/2018).   Orders:  No orders of the defined types were placed in this encounter.  No orders of the defined types were placed in this encounter.     Procedures: No procedures performed   Clinical Data: No additional findings.   Subjective: Chief Complaint  Patient presents with  . Follow-up    04/03/18 R PATELLA SURGERY STILL HAVING R KNEE STIFFNESS, PAIN AND PAIN ALONG THE FRONT AND SIDE OF LOWER LEG, SHOT HELPED SOME FOR A FEW DAYS BUT STILL WILL GET SHOOTING PAINS EVERY ONCE IN A WHILE PER PT.  Kylie Christensen is still having some discomfort in her right knee particularly at the end of the day.  She is finished her therapy and working on home exercise program she has excellent range of motion.  She is not using any ambulatory aid.  She cannot take NSAIDs.  HPI  Review of Systems  Constitutional: Negative for fatigue and fever.  HENT: Negative for ear pain.   Eyes: Negative for pain.  Respiratory: Negative for cough and shortness of breath.   Cardiovascular: Positive  for leg swelling.  Gastrointestinal: Negative for constipation and diarrhea.  Genitourinary: Negative for difficulty urinating.  Musculoskeletal: Negative for back pain and neck pain.  Skin: Negative for rash.  Allergic/Immunologic: Negative for food allergies.  Neurological: Positive for weakness and numbness.  Hematological: Does not bruise/bleed easily.  Psychiatric/Behavioral: Positive for sleep disturbance.     Objective: Vital Signs: BP 104/78 (BP Location: Left Arm, Patient Position: Sitting, Cuff Size: Normal)   Pulse 88   Ht 5\' 3"  (1.6 m)   Wt 165 lb (74.8 kg)   BMI 29.23 kg/m   Physical Exam  Constitutional: She is oriented to person, place, and time. She appears well-developed and well-nourished.  HENT:  Mouth/Throat: Oropharynx is clear and moist.  Eyes: Pupils are equal, round, and reactive to light. EOM are normal.  Pulmonary/Chest: Effort normal.  Neurological: She is alert and oriented to person, place, and time.  Skin: Skin is warm and dry.  Psychiatric: She has a normal mood and affect. Her behavior is normal.    Ortho Exam right knee with full extension and flexion almost 120 degrees.  No instability.  No effusion.  Incision healing nicely.  Some patellar crepitation but minimal discomfort.  No calf pain.  No distal edema.  No popliteal discomfort.  Painless range of motion of right hip.  No local tenderness in the thigh or the knee.  Specialty Comments:  No specialty comments available.  Imaging: No results found.   PMFS History: Patient Active Problem List   Diagnosis Date Noted  . Closed displaced comminuted fracture of right patella 03/31/2018  . Distal radius fracture, left 01/21/2016   Past Medical History:  Diagnosis Date  . Anxiety   . Colitis   . GERD (gastroesophageal reflux disease)   . Headache   . Hypothyroidism     History reviewed. No pertinent family history.  Past Surgical History:  Procedure Laterality Date  . ABDOMINAL  HYSTERECTOMY     partial  . BACK SURGERY  nov 2011   lower back  . BALLOON DILATION N/A 01/25/2015   Procedure: BALLOON DILATION;  Surgeon: Garlan Fair, MD;  Location: [facility];  Service: Endoscopy;  Laterality: N/A;  . BIOPSY BREAST Left yrs ago   benign  . COLONOSCOPY WITH PROPOFOL N/A 01/25/2015   Procedure: COLONOSCOPY WITH PROPOFOL;  Surgeon: Garlan Fair, MD;  Location: [facility];  Service: Endoscopy;  Laterality: N/A;  . ESOPHAGOGASTRODUODENOSCOPY (EGD) WITH PROPOFOL N/A 01/25/2015   Procedure: ESOPHAGOGASTRODUODENOSCOPY (EGD) WITH PROPOFOL;  Surgeon: Garlan Fair, MD;  Location: [facility];  Service: Endoscopy;  Laterality: N/A;  . KNEE SURGERY    . OPEN REDUCTION INTERNAL FIXATION (ORIF) DISTAL RADIAL FRACTURE Left 01/21/2016   Procedure: OPEN REDUCTION INTERNAL FIXATION (ORIF) DISTAL RADIAL FRACTURE;  Surgeon: Iran Planas, MD;  Location: [facility];  Service: Orthopedics;  Laterality: Left;  . rotator cuffr Right   . SHOULDER ARTHROSCOPY    . thyroid nodule removed  1970  . TONSILLECTOMY  age 34  . WRIST FRACTURE SURGERY     Social History   Occupational History  . Not on file  Tobacco Use  . Smoking status: Never Smoker  . Smokeless tobacco: Current User    Types: Chew  Substance and Sexual Activity  . Alcohol use: Yes    Comment: occasional  . Drug use: No  . Sexual activity: Not on file

## 2018-09-11 DIAGNOSIS — M1990 Unspecified osteoarthritis, unspecified site: Secondary | ICD-10-CM | POA: Diagnosis not present

## 2018-09-11 DIAGNOSIS — F329 Major depressive disorder, single episode, unspecified: Secondary | ICD-10-CM | POA: Diagnosis not present

## 2018-09-11 DIAGNOSIS — E782 Mixed hyperlipidemia: Secondary | ICD-10-CM | POA: Diagnosis not present

## 2018-09-11 DIAGNOSIS — M179 Osteoarthritis of knee, unspecified: Secondary | ICD-10-CM | POA: Diagnosis not present

## 2018-09-11 DIAGNOSIS — M810 Age-related osteoporosis without current pathological fracture: Secondary | ICD-10-CM | POA: Diagnosis not present

## 2018-09-11 DIAGNOSIS — J40 Bronchitis, not specified as acute or chronic: Secondary | ICD-10-CM | POA: Diagnosis not present

## 2018-09-11 DIAGNOSIS — E039 Hypothyroidism, unspecified: Secondary | ICD-10-CM | POA: Diagnosis not present

## 2018-09-22 DIAGNOSIS — K529 Noninfective gastroenteritis and colitis, unspecified: Secondary | ICD-10-CM | POA: Diagnosis not present

## 2018-09-22 DIAGNOSIS — K52831 Collagenous colitis: Secondary | ICD-10-CM | POA: Diagnosis not present

## 2018-09-24 DIAGNOSIS — Z961 Presence of intraocular lens: Secondary | ICD-10-CM | POA: Diagnosis not present

## 2018-09-24 DIAGNOSIS — H16223 Keratoconjunctivitis sicca, not specified as Sjogren's, bilateral: Secondary | ICD-10-CM | POA: Diagnosis not present

## 2018-09-24 DIAGNOSIS — H40013 Open angle with borderline findings, low risk, bilateral: Secondary | ICD-10-CM | POA: Diagnosis not present

## 2018-09-24 DIAGNOSIS — H47012 Ischemic optic neuropathy, left eye: Secondary | ICD-10-CM | POA: Diagnosis not present

## 2018-10-15 DIAGNOSIS — Z23 Encounter for immunization: Secondary | ICD-10-CM | POA: Diagnosis not present

## 2018-12-16 DIAGNOSIS — J9801 Acute bronchospasm: Secondary | ICD-10-CM | POA: Diagnosis not present

## 2018-12-16 DIAGNOSIS — J40 Bronchitis, not specified as acute or chronic: Secondary | ICD-10-CM | POA: Diagnosis not present

## 2019-01-06 DIAGNOSIS — K529 Noninfective gastroenteritis and colitis, unspecified: Secondary | ICD-10-CM | POA: Diagnosis not present

## 2019-01-06 DIAGNOSIS — K52831 Collagenous colitis: Secondary | ICD-10-CM | POA: Diagnosis not present

## 2019-01-15 DIAGNOSIS — E039 Hypothyroidism, unspecified: Secondary | ICD-10-CM | POA: Diagnosis not present

## 2019-01-16 DIAGNOSIS — E559 Vitamin D deficiency, unspecified: Secondary | ICD-10-CM | POA: Diagnosis not present

## 2019-01-16 DIAGNOSIS — Z8781 Personal history of (healed) traumatic fracture: Secondary | ICD-10-CM | POA: Diagnosis not present

## 2019-01-16 DIAGNOSIS — E042 Nontoxic multinodular goiter: Secondary | ICD-10-CM | POA: Diagnosis not present

## 2019-01-16 DIAGNOSIS — E039 Hypothyroidism, unspecified: Secondary | ICD-10-CM | POA: Diagnosis not present

## 2019-01-16 DIAGNOSIS — M85852 Other specified disorders of bone density and structure, left thigh: Secondary | ICD-10-CM | POA: Diagnosis not present

## 2019-02-09 ENCOUNTER — Other Ambulatory Visit (INDEPENDENT_AMBULATORY_CARE_PROVIDER_SITE_OTHER): Payer: Self-pay | Admitting: Orthopaedic Surgery

## 2019-02-09 NOTE — Telephone Encounter (Signed)
Ok to refill 

## 2019-02-17 NOTE — Telephone Encounter (Signed)
thanks

## 2019-02-23 DIAGNOSIS — M25511 Pain in right shoulder: Secondary | ICD-10-CM | POA: Diagnosis not present

## 2019-02-23 DIAGNOSIS — K529 Noninfective gastroenteritis and colitis, unspecified: Secondary | ICD-10-CM | POA: Diagnosis not present

## 2019-02-23 DIAGNOSIS — M7581 Other shoulder lesions, right shoulder: Secondary | ICD-10-CM | POA: Diagnosis not present

## 2019-02-23 DIAGNOSIS — K52831 Collagenous colitis: Secondary | ICD-10-CM | POA: Diagnosis not present

## 2019-02-23 DIAGNOSIS — E039 Hypothyroidism, unspecified: Secondary | ICD-10-CM | POA: Diagnosis not present

## 2019-02-23 DIAGNOSIS — M179 Osteoarthritis of knee, unspecified: Secondary | ICD-10-CM | POA: Diagnosis not present

## 2019-02-23 DIAGNOSIS — E782 Mixed hyperlipidemia: Secondary | ICD-10-CM | POA: Diagnosis not present

## 2019-02-23 DIAGNOSIS — M545 Low back pain: Secondary | ICD-10-CM | POA: Diagnosis not present

## 2019-02-23 DIAGNOSIS — F329 Major depressive disorder, single episode, unspecified: Secondary | ICD-10-CM | POA: Diagnosis not present

## 2019-02-23 DIAGNOSIS — Z1389 Encounter for screening for other disorder: Secondary | ICD-10-CM | POA: Diagnosis not present

## 2019-02-23 DIAGNOSIS — M810 Age-related osteoporosis without current pathological fracture: Secondary | ICD-10-CM | POA: Diagnosis not present

## 2019-02-23 DIAGNOSIS — E559 Vitamin D deficiency, unspecified: Secondary | ICD-10-CM | POA: Diagnosis not present

## 2019-02-23 DIAGNOSIS — Z0000 Encounter for general adult medical examination without abnormal findings: Secondary | ICD-10-CM | POA: Diagnosis not present

## 2019-04-02 DIAGNOSIS — K52831 Collagenous colitis: Secondary | ICD-10-CM | POA: Diagnosis not present

## 2019-04-02 DIAGNOSIS — K529 Noninfective gastroenteritis and colitis, unspecified: Secondary | ICD-10-CM | POA: Diagnosis not present

## 2019-04-09 DIAGNOSIS — E782 Mixed hyperlipidemia: Secondary | ICD-10-CM | POA: Diagnosis not present

## 2019-04-09 DIAGNOSIS — F329 Major depressive disorder, single episode, unspecified: Secondary | ICD-10-CM | POA: Diagnosis not present

## 2019-04-09 DIAGNOSIS — M1990 Unspecified osteoarthritis, unspecified site: Secondary | ICD-10-CM | POA: Diagnosis not present

## 2019-04-09 DIAGNOSIS — M179 Osteoarthritis of knee, unspecified: Secondary | ICD-10-CM | POA: Diagnosis not present

## 2019-04-09 DIAGNOSIS — J40 Bronchitis, not specified as acute or chronic: Secondary | ICD-10-CM | POA: Diagnosis not present

## 2019-04-09 DIAGNOSIS — M810 Age-related osteoporosis without current pathological fracture: Secondary | ICD-10-CM | POA: Diagnosis not present

## 2019-04-09 DIAGNOSIS — E039 Hypothyroidism, unspecified: Secondary | ICD-10-CM | POA: Diagnosis not present

## 2019-05-08 DIAGNOSIS — F329 Major depressive disorder, single episode, unspecified: Secondary | ICD-10-CM | POA: Diagnosis not present

## 2019-05-08 DIAGNOSIS — J40 Bronchitis, not specified as acute or chronic: Secondary | ICD-10-CM | POA: Diagnosis not present

## 2019-05-08 DIAGNOSIS — E039 Hypothyroidism, unspecified: Secondary | ICD-10-CM | POA: Diagnosis not present

## 2019-05-08 DIAGNOSIS — M1990 Unspecified osteoarthritis, unspecified site: Secondary | ICD-10-CM | POA: Diagnosis not present

## 2019-05-08 DIAGNOSIS — M810 Age-related osteoporosis without current pathological fracture: Secondary | ICD-10-CM | POA: Diagnosis not present

## 2019-05-08 DIAGNOSIS — M179 Osteoarthritis of knee, unspecified: Secondary | ICD-10-CM | POA: Diagnosis not present

## 2019-05-08 DIAGNOSIS — E782 Mixed hyperlipidemia: Secondary | ICD-10-CM | POA: Diagnosis not present

## 2019-05-21 DIAGNOSIS — Z961 Presence of intraocular lens: Secondary | ICD-10-CM | POA: Diagnosis not present

## 2019-05-21 DIAGNOSIS — H1045 Other chronic allergic conjunctivitis: Secondary | ICD-10-CM | POA: Diagnosis not present

## 2019-05-21 DIAGNOSIS — H47012 Ischemic optic neuropathy, left eye: Secondary | ICD-10-CM | POA: Diagnosis not present

## 2019-05-21 DIAGNOSIS — H16223 Keratoconjunctivitis sicca, not specified as Sjogren's, bilateral: Secondary | ICD-10-CM | POA: Diagnosis not present

## 2019-05-26 DIAGNOSIS — M179 Osteoarthritis of knee, unspecified: Secondary | ICD-10-CM | POA: Diagnosis not present

## 2019-05-26 DIAGNOSIS — M7581 Other shoulder lesions, right shoulder: Secondary | ICD-10-CM | POA: Diagnosis not present

## 2019-05-26 DIAGNOSIS — F419 Anxiety disorder, unspecified: Secondary | ICD-10-CM | POA: Diagnosis not present

## 2019-05-26 DIAGNOSIS — Z96653 Presence of artificial knee joint, bilateral: Secondary | ICD-10-CM | POA: Diagnosis not present

## 2019-05-26 DIAGNOSIS — M6281 Muscle weakness (generalized): Secondary | ICD-10-CM | POA: Diagnosis not present

## 2019-05-26 DIAGNOSIS — R2689 Other abnormalities of gait and mobility: Secondary | ICD-10-CM | POA: Diagnosis not present

## 2019-06-18 DIAGNOSIS — M179 Osteoarthritis of knee, unspecified: Secondary | ICD-10-CM | POA: Diagnosis not present

## 2019-06-18 DIAGNOSIS — R2689 Other abnormalities of gait and mobility: Secondary | ICD-10-CM | POA: Diagnosis not present

## 2019-06-18 DIAGNOSIS — Z96653 Presence of artificial knee joint, bilateral: Secondary | ICD-10-CM | POA: Diagnosis not present

## 2019-06-18 DIAGNOSIS — M6281 Muscle weakness (generalized): Secondary | ICD-10-CM | POA: Diagnosis not present

## 2019-06-18 DIAGNOSIS — F419 Anxiety disorder, unspecified: Secondary | ICD-10-CM | POA: Diagnosis not present

## 2019-06-30 DIAGNOSIS — H47012 Ischemic optic neuropathy, left eye: Secondary | ICD-10-CM | POA: Diagnosis not present

## 2019-07-08 DIAGNOSIS — E782 Mixed hyperlipidemia: Secondary | ICD-10-CM | POA: Diagnosis not present

## 2019-07-08 DIAGNOSIS — J40 Bronchitis, not specified as acute or chronic: Secondary | ICD-10-CM | POA: Diagnosis not present

## 2019-07-08 DIAGNOSIS — F329 Major depressive disorder, single episode, unspecified: Secondary | ICD-10-CM | POA: Diagnosis not present

## 2019-07-08 DIAGNOSIS — M810 Age-related osteoporosis without current pathological fracture: Secondary | ICD-10-CM | POA: Diagnosis not present

## 2019-07-08 DIAGNOSIS — M179 Osteoarthritis of knee, unspecified: Secondary | ICD-10-CM | POA: Diagnosis not present

## 2019-07-08 DIAGNOSIS — M1990 Unspecified osteoarthritis, unspecified site: Secondary | ICD-10-CM | POA: Diagnosis not present

## 2019-07-08 DIAGNOSIS — E039 Hypothyroidism, unspecified: Secondary | ICD-10-CM | POA: Diagnosis not present

## 2019-07-09 DIAGNOSIS — K52831 Collagenous colitis: Secondary | ICD-10-CM | POA: Diagnosis not present

## 2019-07-09 DIAGNOSIS — K529 Noninfective gastroenteritis and colitis, unspecified: Secondary | ICD-10-CM | POA: Diagnosis not present

## 2019-07-21 DIAGNOSIS — M6281 Muscle weakness (generalized): Secondary | ICD-10-CM | POA: Diagnosis not present

## 2019-07-21 DIAGNOSIS — M179 Osteoarthritis of knee, unspecified: Secondary | ICD-10-CM | POA: Diagnosis not present

## 2019-07-21 DIAGNOSIS — R2689 Other abnormalities of gait and mobility: Secondary | ICD-10-CM | POA: Diagnosis not present

## 2019-07-21 DIAGNOSIS — M7581 Other shoulder lesions, right shoulder: Secondary | ICD-10-CM | POA: Diagnosis not present

## 2019-07-21 DIAGNOSIS — F419 Anxiety disorder, unspecified: Secondary | ICD-10-CM | POA: Diagnosis not present

## 2019-07-21 DIAGNOSIS — Z96653 Presence of artificial knee joint, bilateral: Secondary | ICD-10-CM | POA: Diagnosis not present

## 2019-08-04 DIAGNOSIS — H5310 Unspecified subjective visual disturbances: Secondary | ICD-10-CM | POA: Diagnosis not present

## 2019-09-03 DIAGNOSIS — E782 Mixed hyperlipidemia: Secondary | ICD-10-CM | POA: Diagnosis not present

## 2019-09-03 DIAGNOSIS — M179 Osteoarthritis of knee, unspecified: Secondary | ICD-10-CM | POA: Diagnosis not present

## 2019-09-03 DIAGNOSIS — M810 Age-related osteoporosis without current pathological fracture: Secondary | ICD-10-CM | POA: Diagnosis not present

## 2019-09-03 DIAGNOSIS — J40 Bronchitis, not specified as acute or chronic: Secondary | ICD-10-CM | POA: Diagnosis not present

## 2019-09-03 DIAGNOSIS — M1990 Unspecified osteoarthritis, unspecified site: Secondary | ICD-10-CM | POA: Diagnosis not present

## 2019-09-03 DIAGNOSIS — E039 Hypothyroidism, unspecified: Secondary | ICD-10-CM | POA: Diagnosis not present

## 2019-09-03 DIAGNOSIS — F329 Major depressive disorder, single episode, unspecified: Secondary | ICD-10-CM | POA: Diagnosis not present

## 2019-11-18 DIAGNOSIS — K529 Noninfective gastroenteritis and colitis, unspecified: Secondary | ICD-10-CM | POA: Diagnosis not present

## 2019-11-18 DIAGNOSIS — K52831 Collagenous colitis: Secondary | ICD-10-CM | POA: Diagnosis not present

## 2019-12-08 DIAGNOSIS — M1990 Unspecified osteoarthritis, unspecified site: Secondary | ICD-10-CM | POA: Diagnosis not present

## 2019-12-08 DIAGNOSIS — F329 Major depressive disorder, single episode, unspecified: Secondary | ICD-10-CM | POA: Diagnosis not present

## 2019-12-08 DIAGNOSIS — M810 Age-related osteoporosis without current pathological fracture: Secondary | ICD-10-CM | POA: Diagnosis not present

## 2019-12-08 DIAGNOSIS — E782 Mixed hyperlipidemia: Secondary | ICD-10-CM | POA: Diagnosis not present

## 2019-12-08 DIAGNOSIS — E039 Hypothyroidism, unspecified: Secondary | ICD-10-CM | POA: Diagnosis not present

## 2019-12-08 DIAGNOSIS — M179 Osteoarthritis of knee, unspecified: Secondary | ICD-10-CM | POA: Diagnosis not present

## 2019-12-08 DIAGNOSIS — J40 Bronchitis, not specified as acute or chronic: Secondary | ICD-10-CM | POA: Diagnosis not present

## 2019-12-16 DIAGNOSIS — K118 Other diseases of salivary glands: Secondary | ICD-10-CM | POA: Diagnosis not present

## 2020-01-09 DIAGNOSIS — E782 Mixed hyperlipidemia: Secondary | ICD-10-CM | POA: Diagnosis not present

## 2020-01-09 DIAGNOSIS — E039 Hypothyroidism, unspecified: Secondary | ICD-10-CM | POA: Diagnosis not present

## 2020-01-09 DIAGNOSIS — M179 Osteoarthritis of knee, unspecified: Secondary | ICD-10-CM | POA: Diagnosis not present

## 2020-01-09 DIAGNOSIS — M1990 Unspecified osteoarthritis, unspecified site: Secondary | ICD-10-CM | POA: Diagnosis not present

## 2020-01-09 DIAGNOSIS — F329 Major depressive disorder, single episode, unspecified: Secondary | ICD-10-CM | POA: Diagnosis not present

## 2020-01-09 DIAGNOSIS — J40 Bronchitis, not specified as acute or chronic: Secondary | ICD-10-CM | POA: Diagnosis not present

## 2020-01-09 DIAGNOSIS — M810 Age-related osteoporosis without current pathological fracture: Secondary | ICD-10-CM | POA: Diagnosis not present

## 2020-01-19 DIAGNOSIS — E559 Vitamin D deficiency, unspecified: Secondary | ICD-10-CM | POA: Diagnosis not present

## 2020-01-19 DIAGNOSIS — E039 Hypothyroidism, unspecified: Secondary | ICD-10-CM | POA: Diagnosis not present

## 2020-01-19 DIAGNOSIS — E042 Nontoxic multinodular goiter: Secondary | ICD-10-CM | POA: Diagnosis not present

## 2020-02-09 DIAGNOSIS — M810 Age-related osteoporosis without current pathological fracture: Secondary | ICD-10-CM | POA: Diagnosis not present

## 2020-02-09 DIAGNOSIS — E039 Hypothyroidism, unspecified: Secondary | ICD-10-CM | POA: Diagnosis not present

## 2020-02-09 DIAGNOSIS — M179 Osteoarthritis of knee, unspecified: Secondary | ICD-10-CM | POA: Diagnosis not present

## 2020-02-09 DIAGNOSIS — M1990 Unspecified osteoarthritis, unspecified site: Secondary | ICD-10-CM | POA: Diagnosis not present

## 2020-02-09 DIAGNOSIS — E782 Mixed hyperlipidemia: Secondary | ICD-10-CM | POA: Diagnosis not present

## 2020-02-09 DIAGNOSIS — F329 Major depressive disorder, single episode, unspecified: Secondary | ICD-10-CM | POA: Diagnosis not present

## 2020-02-09 DIAGNOSIS — J40 Bronchitis, not specified as acute or chronic: Secondary | ICD-10-CM | POA: Diagnosis not present

## 2020-03-01 DIAGNOSIS — M1990 Unspecified osteoarthritis, unspecified site: Secondary | ICD-10-CM | POA: Diagnosis not present

## 2020-03-01 DIAGNOSIS — M810 Age-related osteoporosis without current pathological fracture: Secondary | ICD-10-CM | POA: Diagnosis not present

## 2020-03-01 DIAGNOSIS — M179 Osteoarthritis of knee, unspecified: Secondary | ICD-10-CM | POA: Diagnosis not present

## 2020-03-01 DIAGNOSIS — J40 Bronchitis, not specified as acute or chronic: Secondary | ICD-10-CM | POA: Diagnosis not present

## 2020-03-01 DIAGNOSIS — E039 Hypothyroidism, unspecified: Secondary | ICD-10-CM | POA: Diagnosis not present

## 2020-03-01 DIAGNOSIS — E782 Mixed hyperlipidemia: Secondary | ICD-10-CM | POA: Diagnosis not present

## 2020-03-01 DIAGNOSIS — F322 Major depressive disorder, single episode, severe without psychotic features: Secondary | ICD-10-CM | POA: Diagnosis not present

## 2020-03-01 DIAGNOSIS — G43009 Migraine without aura, not intractable, without status migrainosus: Secondary | ICD-10-CM | POA: Diagnosis not present

## 2020-03-01 DIAGNOSIS — F329 Major depressive disorder, single episode, unspecified: Secondary | ICD-10-CM | POA: Diagnosis not present

## 2020-03-07 DIAGNOSIS — Z0000 Encounter for general adult medical examination without abnormal findings: Secondary | ICD-10-CM | POA: Diagnosis not present

## 2020-03-07 DIAGNOSIS — E559 Vitamin D deficiency, unspecified: Secondary | ICD-10-CM | POA: Diagnosis not present

## 2020-03-07 DIAGNOSIS — G629 Polyneuropathy, unspecified: Secondary | ICD-10-CM | POA: Diagnosis not present

## 2020-03-07 DIAGNOSIS — Z1389 Encounter for screening for other disorder: Secondary | ICD-10-CM | POA: Diagnosis not present

## 2020-03-07 DIAGNOSIS — F322 Major depressive disorder, single episode, severe without psychotic features: Secondary | ICD-10-CM | POA: Diagnosis not present

## 2020-03-07 DIAGNOSIS — M810 Age-related osteoporosis without current pathological fracture: Secondary | ICD-10-CM | POA: Diagnosis not present

## 2020-03-07 DIAGNOSIS — K5289 Other specified noninfective gastroenteritis and colitis: Secondary | ICD-10-CM | POA: Diagnosis not present

## 2020-03-07 DIAGNOSIS — M25561 Pain in right knee: Secondary | ICD-10-CM | POA: Diagnosis not present

## 2020-03-07 DIAGNOSIS — M179 Osteoarthritis of knee, unspecified: Secondary | ICD-10-CM | POA: Diagnosis not present

## 2020-03-07 DIAGNOSIS — E039 Hypothyroidism, unspecified: Secondary | ICD-10-CM | POA: Diagnosis not present

## 2020-03-07 DIAGNOSIS — E782 Mixed hyperlipidemia: Secondary | ICD-10-CM | POA: Diagnosis not present

## 2020-03-07 DIAGNOSIS — R21 Rash and other nonspecific skin eruption: Secondary | ICD-10-CM | POA: Diagnosis not present

## 2020-03-07 DIAGNOSIS — R195 Other fecal abnormalities: Secondary | ICD-10-CM | POA: Diagnosis not present

## 2020-03-14 ENCOUNTER — Other Ambulatory Visit: Payer: Self-pay | Admitting: Internal Medicine

## 2020-03-14 DIAGNOSIS — Z1231 Encounter for screening mammogram for malignant neoplasm of breast: Secondary | ICD-10-CM

## 2020-03-14 DIAGNOSIS — E2839 Other primary ovarian failure: Secondary | ICD-10-CM

## 2020-04-11 DIAGNOSIS — M179 Osteoarthritis of knee, unspecified: Secondary | ICD-10-CM | POA: Diagnosis not present

## 2020-04-11 DIAGNOSIS — F322 Major depressive disorder, single episode, severe without psychotic features: Secondary | ICD-10-CM | POA: Diagnosis not present

## 2020-04-11 DIAGNOSIS — M810 Age-related osteoporosis without current pathological fracture: Secondary | ICD-10-CM | POA: Diagnosis not present

## 2020-04-11 DIAGNOSIS — E039 Hypothyroidism, unspecified: Secondary | ICD-10-CM | POA: Diagnosis not present

## 2020-04-11 DIAGNOSIS — E782 Mixed hyperlipidemia: Secondary | ICD-10-CM | POA: Diagnosis not present

## 2020-04-11 DIAGNOSIS — F329 Major depressive disorder, single episode, unspecified: Secondary | ICD-10-CM | POA: Diagnosis not present

## 2020-04-11 DIAGNOSIS — M1990 Unspecified osteoarthritis, unspecified site: Secondary | ICD-10-CM | POA: Diagnosis not present

## 2020-04-11 DIAGNOSIS — J40 Bronchitis, not specified as acute or chronic: Secondary | ICD-10-CM | POA: Diagnosis not present

## 2020-04-11 DIAGNOSIS — G43009 Migraine without aura, not intractable, without status migrainosus: Secondary | ICD-10-CM | POA: Diagnosis not present

## 2020-05-12 DIAGNOSIS — F322 Major depressive disorder, single episode, severe without psychotic features: Secondary | ICD-10-CM | POA: Diagnosis not present

## 2020-05-12 DIAGNOSIS — E782 Mixed hyperlipidemia: Secondary | ICD-10-CM | POA: Diagnosis not present

## 2020-05-12 DIAGNOSIS — E039 Hypothyroidism, unspecified: Secondary | ICD-10-CM | POA: Diagnosis not present

## 2020-05-12 DIAGNOSIS — M1990 Unspecified osteoarthritis, unspecified site: Secondary | ICD-10-CM | POA: Diagnosis not present

## 2020-05-12 DIAGNOSIS — M810 Age-related osteoporosis without current pathological fracture: Secondary | ICD-10-CM | POA: Diagnosis not present

## 2020-05-12 DIAGNOSIS — M179 Osteoarthritis of knee, unspecified: Secondary | ICD-10-CM | POA: Diagnosis not present

## 2020-05-12 DIAGNOSIS — F329 Major depressive disorder, single episode, unspecified: Secondary | ICD-10-CM | POA: Diagnosis not present

## 2020-05-12 DIAGNOSIS — G43009 Migraine without aura, not intractable, without status migrainosus: Secondary | ICD-10-CM | POA: Diagnosis not present

## 2020-05-27 ENCOUNTER — Ambulatory Visit
Admission: RE | Admit: 2020-05-27 | Discharge: 2020-05-27 | Disposition: A | Discharge status: Home / Self Care | Payer: PPO | Source: Ambulatory Visit | Attending: Internal Medicine | Admitting: Internal Medicine

## 2020-05-27 ENCOUNTER — Other Ambulatory Visit: Payer: Self-pay

## 2020-05-27 DIAGNOSIS — Z1231 Encounter for screening mammogram for malignant neoplasm of breast: Secondary | ICD-10-CM

## 2020-05-27 DIAGNOSIS — E2839 Other primary ovarian failure: Secondary | ICD-10-CM

## 2020-05-27 DIAGNOSIS — M85851 Other specified disorders of bone density and structure, right thigh: Secondary | ICD-10-CM | POA: Diagnosis not present

## 2020-05-27 DIAGNOSIS — Z780 Asymptomatic menopausal state: Secondary | ICD-10-CM | POA: Diagnosis not present

## 2020-08-09 DIAGNOSIS — H5310 Unspecified subjective visual disturbances: Secondary | ICD-10-CM | POA: Diagnosis not present

## 2020-08-09 DIAGNOSIS — Z961 Presence of intraocular lens: Secondary | ICD-10-CM | POA: Diagnosis not present

## 2020-08-09 DIAGNOSIS — H524 Presbyopia: Secondary | ICD-10-CM | POA: Diagnosis not present

## 2020-09-06 DIAGNOSIS — E039 Hypothyroidism, unspecified: Secondary | ICD-10-CM | POA: Diagnosis not present

## 2020-09-12 DIAGNOSIS — F329 Major depressive disorder, single episode, unspecified: Secondary | ICD-10-CM | POA: Diagnosis not present

## 2020-09-12 DIAGNOSIS — M1990 Unspecified osteoarthritis, unspecified site: Secondary | ICD-10-CM | POA: Diagnosis not present

## 2020-09-12 DIAGNOSIS — G43009 Migraine without aura, not intractable, without status migrainosus: Secondary | ICD-10-CM | POA: Diagnosis not present

## 2020-09-12 DIAGNOSIS — J40 Bronchitis, not specified as acute or chronic: Secondary | ICD-10-CM | POA: Diagnosis not present

## 2020-09-12 DIAGNOSIS — E782 Mixed hyperlipidemia: Secondary | ICD-10-CM | POA: Diagnosis not present

## 2020-09-12 DIAGNOSIS — M810 Age-related osteoporosis without current pathological fracture: Secondary | ICD-10-CM | POA: Diagnosis not present

## 2020-09-12 DIAGNOSIS — F322 Major depressive disorder, single episode, severe without psychotic features: Secondary | ICD-10-CM | POA: Diagnosis not present

## 2020-09-12 DIAGNOSIS — E039 Hypothyroidism, unspecified: Secondary | ICD-10-CM | POA: Diagnosis not present

## 2020-09-12 DIAGNOSIS — M179 Osteoarthritis of knee, unspecified: Secondary | ICD-10-CM | POA: Diagnosis not present

## 2020-10-14 DIAGNOSIS — M810 Age-related osteoporosis without current pathological fracture: Secondary | ICD-10-CM | POA: Diagnosis not present

## 2020-10-14 DIAGNOSIS — E782 Mixed hyperlipidemia: Secondary | ICD-10-CM | POA: Diagnosis not present

## 2020-10-14 DIAGNOSIS — E039 Hypothyroidism, unspecified: Secondary | ICD-10-CM | POA: Diagnosis not present

## 2020-10-14 DIAGNOSIS — F322 Major depressive disorder, single episode, severe without psychotic features: Secondary | ICD-10-CM | POA: Diagnosis not present

## 2020-10-14 DIAGNOSIS — J40 Bronchitis, not specified as acute or chronic: Secondary | ICD-10-CM | POA: Diagnosis not present

## 2020-10-14 DIAGNOSIS — M179 Osteoarthritis of knee, unspecified: Secondary | ICD-10-CM | POA: Diagnosis not present

## 2020-10-14 DIAGNOSIS — G43009 Migraine without aura, not intractable, without status migrainosus: Secondary | ICD-10-CM | POA: Diagnosis not present

## 2020-10-14 DIAGNOSIS — F329 Major depressive disorder, single episode, unspecified: Secondary | ICD-10-CM | POA: Diagnosis not present

## 2020-10-14 DIAGNOSIS — M1990 Unspecified osteoarthritis, unspecified site: Secondary | ICD-10-CM | POA: Diagnosis not present

## 2020-10-31 DIAGNOSIS — H0100A Unspecified blepharitis right eye, upper and lower eyelids: Secondary | ICD-10-CM | POA: Diagnosis not present

## 2020-10-31 DIAGNOSIS — H1131 Conjunctival hemorrhage, right eye: Secondary | ICD-10-CM | POA: Diagnosis not present

## 2020-11-08 DIAGNOSIS — H1131 Conjunctival hemorrhage, right eye: Secondary | ICD-10-CM | POA: Diagnosis not present

## 2020-11-30 DIAGNOSIS — G43009 Migraine without aura, not intractable, without status migrainosus: Secondary | ICD-10-CM | POA: Diagnosis not present

## 2020-11-30 DIAGNOSIS — E782 Mixed hyperlipidemia: Secondary | ICD-10-CM | POA: Diagnosis not present

## 2020-11-30 DIAGNOSIS — J40 Bronchitis, not specified as acute or chronic: Secondary | ICD-10-CM | POA: Diagnosis not present

## 2020-11-30 DIAGNOSIS — M810 Age-related osteoporosis without current pathological fracture: Secondary | ICD-10-CM | POA: Diagnosis not present

## 2020-11-30 DIAGNOSIS — E039 Hypothyroidism, unspecified: Secondary | ICD-10-CM | POA: Diagnosis not present

## 2020-11-30 DIAGNOSIS — K219 Gastro-esophageal reflux disease without esophagitis: Secondary | ICD-10-CM | POA: Diagnosis not present

## 2020-11-30 DIAGNOSIS — F329 Major depressive disorder, single episode, unspecified: Secondary | ICD-10-CM | POA: Diagnosis not present

## 2020-11-30 DIAGNOSIS — G4700 Insomnia, unspecified: Secondary | ICD-10-CM | POA: Diagnosis not present

## 2020-11-30 DIAGNOSIS — M1990 Unspecified osteoarthritis, unspecified site: Secondary | ICD-10-CM | POA: Diagnosis not present

## 2020-11-30 DIAGNOSIS — M179 Osteoarthritis of knee, unspecified: Secondary | ICD-10-CM | POA: Diagnosis not present

## 2021-01-11 DIAGNOSIS — G4700 Insomnia, unspecified: Secondary | ICD-10-CM | POA: Diagnosis not present

## 2021-01-11 DIAGNOSIS — E039 Hypothyroidism, unspecified: Secondary | ICD-10-CM | POA: Diagnosis not present

## 2021-01-11 DIAGNOSIS — K219 Gastro-esophageal reflux disease without esophagitis: Secondary | ICD-10-CM | POA: Diagnosis not present

## 2021-01-11 DIAGNOSIS — F329 Major depressive disorder, single episode, unspecified: Secondary | ICD-10-CM | POA: Diagnosis not present

## 2021-01-11 DIAGNOSIS — E782 Mixed hyperlipidemia: Secondary | ICD-10-CM | POA: Diagnosis not present

## 2021-01-11 DIAGNOSIS — M810 Age-related osteoporosis without current pathological fracture: Secondary | ICD-10-CM | POA: Diagnosis not present

## 2021-01-11 DIAGNOSIS — M179 Osteoarthritis of knee, unspecified: Secondary | ICD-10-CM | POA: Diagnosis not present

## 2021-01-11 DIAGNOSIS — J40 Bronchitis, not specified as acute or chronic: Secondary | ICD-10-CM | POA: Diagnosis not present

## 2021-01-11 DIAGNOSIS — G43009 Migraine without aura, not intractable, without status migrainosus: Secondary | ICD-10-CM | POA: Diagnosis not present

## 2021-01-19 ENCOUNTER — Other Ambulatory Visit: Payer: Self-pay | Admitting: Physician Assistant

## 2021-01-19 ENCOUNTER — Ambulatory Visit
Admission: RE | Admit: 2021-01-19 | Discharge: 2021-01-19 | Disposition: A | Discharge status: Home / Self Care | Payer: PPO | Source: Ambulatory Visit | Attending: Physician Assistant | Admitting: Physician Assistant

## 2021-01-19 DIAGNOSIS — M40204 Unspecified kyphosis, thoracic region: Secondary | ICD-10-CM | POA: Diagnosis not present

## 2021-01-19 DIAGNOSIS — M4319 Spondylolisthesis, multiple sites in spine: Secondary | ICD-10-CM | POA: Diagnosis not present

## 2021-01-19 DIAGNOSIS — M546 Pain in thoracic spine: Secondary | ICD-10-CM

## 2021-01-19 DIAGNOSIS — M47814 Spondylosis without myelopathy or radiculopathy, thoracic region: Secondary | ICD-10-CM | POA: Diagnosis not present

## 2021-01-19 DIAGNOSIS — Z981 Arthrodesis status: Secondary | ICD-10-CM | POA: Diagnosis not present

## 2021-01-19 DIAGNOSIS — R300 Dysuria: Secondary | ICD-10-CM | POA: Diagnosis not present

## 2021-01-19 DIAGNOSIS — M545 Low back pain, unspecified: Secondary | ICD-10-CM

## 2021-01-19 DIAGNOSIS — M5135 Other intervertebral disc degeneration, thoracolumbar region: Secondary | ICD-10-CM | POA: Diagnosis not present

## 2021-01-20 DIAGNOSIS — E042 Nontoxic multinodular goiter: Secondary | ICD-10-CM | POA: Diagnosis not present

## 2021-01-20 DIAGNOSIS — E039 Hypothyroidism, unspecified: Secondary | ICD-10-CM | POA: Diagnosis not present

## 2021-01-20 DIAGNOSIS — R202 Paresthesia of skin: Secondary | ICD-10-CM | POA: Diagnosis not present

## 2021-01-20 DIAGNOSIS — Z9889 Other specified postprocedural states: Secondary | ICD-10-CM | POA: Diagnosis not present

## 2021-03-07 DIAGNOSIS — K219 Gastro-esophageal reflux disease without esophagitis: Secondary | ICD-10-CM | POA: Diagnosis not present

## 2021-03-07 DIAGNOSIS — K529 Noninfective gastroenteritis and colitis, unspecified: Secondary | ICD-10-CM | POA: Diagnosis not present

## 2021-03-07 DIAGNOSIS — K52831 Collagenous colitis: Secondary | ICD-10-CM | POA: Diagnosis not present

## 2021-03-10 DIAGNOSIS — Z7189 Other specified counseling: Secondary | ICD-10-CM | POA: Diagnosis not present

## 2021-03-10 DIAGNOSIS — K52831 Collagenous colitis: Secondary | ICD-10-CM | POA: Diagnosis not present

## 2021-03-10 DIAGNOSIS — E039 Hypothyroidism, unspecified: Secondary | ICD-10-CM | POA: Diagnosis not present

## 2021-03-10 DIAGNOSIS — G4700 Insomnia, unspecified: Secondary | ICD-10-CM | POA: Diagnosis not present

## 2021-03-10 DIAGNOSIS — E782 Mixed hyperlipidemia: Secondary | ICD-10-CM | POA: Diagnosis not present

## 2021-03-10 DIAGNOSIS — Z0000 Encounter for general adult medical examination without abnormal findings: Secondary | ICD-10-CM | POA: Diagnosis not present

## 2021-03-10 DIAGNOSIS — F322 Major depressive disorder, single episode, severe without psychotic features: Secondary | ICD-10-CM | POA: Diagnosis not present

## 2021-03-10 DIAGNOSIS — F329 Major depressive disorder, single episode, unspecified: Secondary | ICD-10-CM | POA: Diagnosis not present

## 2021-03-10 DIAGNOSIS — Z79899 Other long term (current) drug therapy: Secondary | ICD-10-CM | POA: Diagnosis not present

## 2021-03-10 DIAGNOSIS — G43009 Migraine without aura, not intractable, without status migrainosus: Secondary | ICD-10-CM | POA: Diagnosis not present

## 2021-03-10 DIAGNOSIS — E559 Vitamin D deficiency, unspecified: Secondary | ICD-10-CM | POA: Diagnosis not present

## 2021-03-10 DIAGNOSIS — G629 Polyneuropathy, unspecified: Secondary | ICD-10-CM | POA: Diagnosis not present

## 2021-03-10 DIAGNOSIS — K219 Gastro-esophageal reflux disease without esophagitis: Secondary | ICD-10-CM | POA: Diagnosis not present

## 2021-03-10 DIAGNOSIS — E042 Nontoxic multinodular goiter: Secondary | ICD-10-CM | POA: Diagnosis not present

## 2021-03-11 DIAGNOSIS — E039 Hypothyroidism, unspecified: Secondary | ICD-10-CM | POA: Diagnosis not present

## 2021-03-11 DIAGNOSIS — G43009 Migraine without aura, not intractable, without status migrainosus: Secondary | ICD-10-CM | POA: Diagnosis not present

## 2021-03-11 DIAGNOSIS — M810 Age-related osteoporosis without current pathological fracture: Secondary | ICD-10-CM | POA: Diagnosis not present

## 2021-03-11 DIAGNOSIS — M1990 Unspecified osteoarthritis, unspecified site: Secondary | ICD-10-CM | POA: Diagnosis not present

## 2021-03-11 DIAGNOSIS — F329 Major depressive disorder, single episode, unspecified: Secondary | ICD-10-CM | POA: Diagnosis not present

## 2021-03-11 DIAGNOSIS — E782 Mixed hyperlipidemia: Secondary | ICD-10-CM | POA: Diagnosis not present

## 2021-03-11 DIAGNOSIS — K219 Gastro-esophageal reflux disease without esophagitis: Secondary | ICD-10-CM | POA: Diagnosis not present

## 2021-03-11 DIAGNOSIS — G4700 Insomnia, unspecified: Secondary | ICD-10-CM | POA: Diagnosis not present

## 2021-03-11 DIAGNOSIS — J40 Bronchitis, not specified as acute or chronic: Secondary | ICD-10-CM | POA: Diagnosis not present

## 2021-05-01 DIAGNOSIS — M1990 Unspecified osteoarthritis, unspecified site: Secondary | ICD-10-CM | POA: Diagnosis not present

## 2021-05-01 DIAGNOSIS — G4700 Insomnia, unspecified: Secondary | ICD-10-CM | POA: Diagnosis not present

## 2021-05-01 DIAGNOSIS — M810 Age-related osteoporosis without current pathological fracture: Secondary | ICD-10-CM | POA: Diagnosis not present

## 2021-05-01 DIAGNOSIS — K219 Gastro-esophageal reflux disease without esophagitis: Secondary | ICD-10-CM | POA: Diagnosis not present

## 2021-05-01 DIAGNOSIS — E039 Hypothyroidism, unspecified: Secondary | ICD-10-CM | POA: Diagnosis not present

## 2021-05-01 DIAGNOSIS — E782 Mixed hyperlipidemia: Secondary | ICD-10-CM | POA: Diagnosis not present

## 2021-05-01 DIAGNOSIS — G43009 Migraine without aura, not intractable, without status migrainosus: Secondary | ICD-10-CM | POA: Diagnosis not present

## 2021-05-01 DIAGNOSIS — M179 Osteoarthritis of knee, unspecified: Secondary | ICD-10-CM | POA: Diagnosis not present

## 2021-05-01 DIAGNOSIS — F329 Major depressive disorder, single episode, unspecified: Secondary | ICD-10-CM | POA: Diagnosis not present

## 2021-05-01 DIAGNOSIS — J40 Bronchitis, not specified as acute or chronic: Secondary | ICD-10-CM | POA: Diagnosis not present

## 2021-05-02 ENCOUNTER — Other Ambulatory Visit: Payer: Self-pay | Admitting: Pediatric Surgery

## 2021-05-02 ENCOUNTER — Other Ambulatory Visit: Payer: Self-pay | Admitting: Internal Medicine

## 2021-05-02 DIAGNOSIS — Z1231 Encounter for screening mammogram for malignant neoplasm of breast: Secondary | ICD-10-CM

## 2021-06-12 DIAGNOSIS — H903 Sensorineural hearing loss, bilateral: Secondary | ICD-10-CM | POA: Diagnosis not present

## 2021-06-15 DIAGNOSIS — K52831 Collagenous colitis: Secondary | ICD-10-CM | POA: Diagnosis not present

## 2021-06-15 DIAGNOSIS — K529 Noninfective gastroenteritis and colitis, unspecified: Secondary | ICD-10-CM | POA: Diagnosis not present

## 2021-06-22 DIAGNOSIS — H903 Sensorineural hearing loss, bilateral: Secondary | ICD-10-CM | POA: Diagnosis not present

## 2021-06-28 ENCOUNTER — Other Ambulatory Visit: Payer: Self-pay

## 2021-06-28 ENCOUNTER — Ambulatory Visit
Admission: RE | Admit: 2021-06-28 | Discharge: 2021-06-28 | Disposition: A | Discharge status: Home / Self Care | Payer: PPO | Source: Ambulatory Visit | Attending: Internal Medicine | Admitting: Internal Medicine

## 2021-06-28 DIAGNOSIS — Z1231 Encounter for screening mammogram for malignant neoplasm of breast: Secondary | ICD-10-CM

## 2021-06-29 DIAGNOSIS — G43009 Migraine without aura, not intractable, without status migrainosus: Secondary | ICD-10-CM | POA: Diagnosis not present

## 2021-06-29 DIAGNOSIS — F322 Major depressive disorder, single episode, severe without psychotic features: Secondary | ICD-10-CM | POA: Diagnosis not present

## 2021-06-29 DIAGNOSIS — E782 Mixed hyperlipidemia: Secondary | ICD-10-CM | POA: Diagnosis not present

## 2021-06-29 DIAGNOSIS — F329 Major depressive disorder, single episode, unspecified: Secondary | ICD-10-CM | POA: Diagnosis not present

## 2021-06-29 DIAGNOSIS — E039 Hypothyroidism, unspecified: Secondary | ICD-10-CM | POA: Diagnosis not present

## 2021-06-29 DIAGNOSIS — G4700 Insomnia, unspecified: Secondary | ICD-10-CM | POA: Diagnosis not present

## 2021-06-29 DIAGNOSIS — K219 Gastro-esophageal reflux disease without esophagitis: Secondary | ICD-10-CM | POA: Diagnosis not present

## 2021-06-29 DIAGNOSIS — M810 Age-related osteoporosis without current pathological fracture: Secondary | ICD-10-CM | POA: Diagnosis not present

## 2021-06-29 DIAGNOSIS — J40 Bronchitis, not specified as acute or chronic: Secondary | ICD-10-CM | POA: Diagnosis not present

## 2021-06-29 DIAGNOSIS — M1990 Unspecified osteoarthritis, unspecified site: Secondary | ICD-10-CM | POA: Diagnosis not present

## 2021-06-29 DIAGNOSIS — M179 Osteoarthritis of knee, unspecified: Secondary | ICD-10-CM | POA: Diagnosis not present

## 2021-07-07 ENCOUNTER — Emergency Department (HOSPITAL_BASED_OUTPATIENT_CLINIC_OR_DEPARTMENT_OTHER): Payer: PPO | Admitting: Radiology

## 2021-07-07 ENCOUNTER — Other Ambulatory Visit: Payer: Self-pay

## 2021-07-07 ENCOUNTER — Emergency Department (HOSPITAL_BASED_OUTPATIENT_CLINIC_OR_DEPARTMENT_OTHER)
Admission: EM | Admit: 2021-07-07 | Discharge: 2021-07-07 | Disposition: A | Discharge status: Home / Self Care | Payer: PPO | Attending: Emergency Medicine | Admitting: Emergency Medicine

## 2021-07-07 ENCOUNTER — Encounter (HOSPITAL_BASED_OUTPATIENT_CLINIC_OR_DEPARTMENT_OTHER): Payer: Self-pay

## 2021-07-07 ENCOUNTER — Emergency Department (HOSPITAL_BASED_OUTPATIENT_CLINIC_OR_DEPARTMENT_OTHER): Payer: PPO

## 2021-07-07 DIAGNOSIS — E039 Hypothyroidism, unspecified: Secondary | ICD-10-CM | POA: Diagnosis not present

## 2021-07-07 DIAGNOSIS — S7001XA Contusion of right hip, initial encounter: Secondary | ICD-10-CM | POA: Insufficient documentation

## 2021-07-07 DIAGNOSIS — M25551 Pain in right hip: Secondary | ICD-10-CM | POA: Diagnosis not present

## 2021-07-07 DIAGNOSIS — S42201A Unspecified fracture of upper end of right humerus, initial encounter for closed fracture: Secondary | ICD-10-CM | POA: Diagnosis not present

## 2021-07-07 DIAGNOSIS — S32591A Other specified fracture of right pubis, initial encounter for closed fracture: Secondary | ICD-10-CM

## 2021-07-07 DIAGNOSIS — R102 Pelvic and perineal pain: Secondary | ICD-10-CM | POA: Insufficient documentation

## 2021-07-07 DIAGNOSIS — S42351A Displaced comminuted fracture of shaft of humerus, right arm, initial encounter for closed fracture: Secondary | ICD-10-CM | POA: Diagnosis not present

## 2021-07-07 DIAGNOSIS — W010XXA Fall on same level from slipping, tripping and stumbling without subsequent striking against object, initial encounter: Secondary | ICD-10-CM | POA: Insufficient documentation

## 2021-07-07 DIAGNOSIS — Z79899 Other long term (current) drug therapy: Secondary | ICD-10-CM | POA: Diagnosis not present

## 2021-07-07 DIAGNOSIS — S4991XA Unspecified injury of right shoulder and upper arm, initial encounter: Secondary | ICD-10-CM | POA: Diagnosis present

## 2021-07-07 DIAGNOSIS — M25521 Pain in right elbow: Secondary | ICD-10-CM | POA: Insufficient documentation

## 2021-07-07 DIAGNOSIS — S32501A Unspecified fracture of right pubis, initial encounter for closed fracture: Secondary | ICD-10-CM | POA: Diagnosis not present

## 2021-07-07 MED ORDER — HYDROCODONE-ACETAMINOPHEN 5-325 MG PO TABS: 1.0000 | ORAL_TABLET | Freq: Four times a day (QID) | ORAL | 0 refills | Status: DC | PRN

## 2021-07-07 MED ORDER — HYDROCODONE-ACETAMINOPHEN 5-325 MG PO TABS
1.0000 | ORAL_TABLET | Freq: Once | ORAL | Status: AC
  Administered 2021-07-07: 1 via ORAL
  Filled 2021-07-07: qty 1

## 2021-07-07 NOTE — ED Notes (Signed)
Pt transported to vehicle via w/c by NT

## 2021-07-07 NOTE — ED Provider Notes (Addendum)
[facility] EMERGENCY DEPT Provider Note   CSN: [id number] Arrival date & time: 07/07/21  1408     History Chief Complaint  Patient presents with  . Fall    Kylie Christensen is a 80 y.o. female.   Fall   Patient presented to the ED for evaluation of injuries associated with a fall.  Patient was playing pickle ball today when she tripped and fell landing on her right shoulder elbow and hip.  Patient states initially the pain was primarily in her shoulder and elbow but she has had increasing pain in her hip.  It seems to be more in the right groin area now.  She was able to get up and stand.  She denies any headache or loss of consciousness.  No numbness or weakness.  Past Medical History:  Diagnosis Date  . Anxiety   . Colitis   . GERD (gastroesophageal reflux disease)   . Headache   . Hypothyroidism     Patient Active Problem List   Diagnosis Date Noted  . Closed displaced comminuted fracture of right patella 03/31/2018  . Distal radius fracture, left 01/21/2016    Past Surgical History:  Procedure Laterality Date  . ABDOMINAL HYSTERECTOMY     partial  . BACK SURGERY  nov 2011   lower back  . BALLOON DILATION N/A 01/25/2015   Procedure: BALLOON DILATION;  Surgeon: Garlan Fair, MD;  Location: [facility];  Service: Endoscopy;  Laterality: N/A;  . BIOPSY BREAST Left yrs ago   benign  . COLONOSCOPY WITH PROPOFOL N/A 01/25/2015   Procedure: COLONOSCOPY WITH PROPOFOL;  Surgeon: Garlan Fair, MD;  Location: [facility];  Service: Endoscopy;  Laterality: N/A;  . ESOPHAGOGASTRODUODENOSCOPY (EGD) WITH PROPOFOL N/A 01/25/2015   Procedure: ESOPHAGOGASTRODUODENOSCOPY (EGD) WITH PROPOFOL;  Surgeon: Garlan Fair, MD;  Location: [facility];  Service: Endoscopy;  Laterality: N/A;  . KNEE SURGERY    . OPEN REDUCTION INTERNAL FIXATION (ORIF) DISTAL RADIAL FRACTURE Left 01/21/2016   Procedure: OPEN REDUCTION INTERNAL FIXATION (ORIF) DISTAL RADIAL FRACTURE;   Surgeon: Iran Planas, MD;  Location: [facility];  Service: Orthopedics;  Laterality: Left;  . rotator cuffr Right   . SHOULDER ARTHROSCOPY    . thyroid nodule removed  1970  . TONSILLECTOMY  age 18  . WRIST FRACTURE SURGERY       OB History   No obstetric history on file.     History reviewed. No pertinent family history.  Social History   Tobacco Use  . Smoking status: Never  . Smokeless tobacco: Current    Types: Chew  Vaping Use  . Vaping Use: Never used  Substance Use Topics  . Alcohol use: Yes    Comment: occasional  . Drug use: No    Home Medications Prior to Admission medications   Medication Sig Start Date End Date Taking? Authorizing Provider  HYDROcodone-acetaminophen (NORCO/VICODIN) 5-325 MG tablet Take 1 tablet by mouth every 6 (six) hours as needed. 07/07/21  Yes Dorie Rank, MD  B Complex-C (B-COMPLEX WITH VITAMIN C) tablet Take 1 tablet by mouth daily.    [provider]  beta carotene w/minerals (OCUVITE) tablet Take 1 tablet by mouth daily.    [provider]  Budesonide ER 9 MG TB24 TAKE 1 TABLET BY MOUTH EVERY DAY IN THE MORNING 07/24/18   [provider]  calcium carbonate (OS-CAL - DOSED IN MG OF ELEMENTAL CALCIUM) 1250 (500 Ca) MG tablet Take 1 tablet by mouth.  [provider]  cholecalciferol (VITAMIN D) 1000 units tablet Take 2,000 Units by mouth daily.    [provider]  colestipol (COLESTID) 1 g tablet TAKE 2 TABLETS BY MOUTH AT NIGHT 07/31/18   [provider]  cyclobenzaprine (FLEXERIL) 5 MG tablet Take 5 mg by mouth 3 (three) times daily as needed. 04/03/18   [provider]  cyclobenzaprine (FLEXERIL) 5 MG tablet Take 1 tablet (5 mg total) by mouth 3 (three) times daily as needed for muscle spasms. 04/29/18   Garald Balding, MD  cycloSPORINE (RESTASIS) 0.05 % ophthalmic emulsion Place 1 drop into both eyes 2 (two) times daily.     [provider]  diclofenac sodium (VOLTAREN) 1  % GEL APPLY 4G TOPICALLY 4 TIMES DAILY TO LARGE JOINT AS NEEDED 02/16/19   Garald Balding, MD  docusate sodium (COLACE) 100 MG capsule Take 1 capsule (100 mg total) by mouth 2 (two) times daily. 01/21/16   Iran Planas, MD  FLUoxetine (PROZAC) 20 MG capsule Take 20 mg by mouth daily.    [provider]  gabapentin (NEURONTIN) 100 MG capsule TAKE ONE CAPSULE BY MOUTH AT BEDTIME 07/15/18   Garald Balding, MD  levothyroxine (SYNTHROID, LEVOTHROID) 50 MCG tablet Take 25-50 mcg by mouth every other day. Alternates between 4mg and 50 mcg. Brand name only    [provider]  levothyroxine (SYNTHROID, LEVOTHROID) 50 MCG tablet Take by mouth.    [provider]  methocarbamol (ROBAXIN) 500 MG tablet Take 1 tablet (500 mg total) by mouth 4 (four) times daily. 01/21/16   OIran Planas MD  Multiple Vitamin (MULTIVITAMIN WITH MINERALS) TABS tablet Take 1 tablet by mouth daily.    [provider]  omega-3 acid ethyl esters (LOVAZA) 1 g capsule Take 2 g by mouth daily.    [provider]  oxyCODONE-acetaminophen (ROXICET) 5-325 MG tablet Take 1 tablet by mouth every 4 (four) hours as needed for severe pain. 01/21/16   OIran Planas MD  Polyvinyl Alcohol-Povidone (REFRESH OP) Apply 1 drop to eye 3 (three) times daily as needed (Dry eyes).    [provider]  sertraline (ZOLOFT) 50 MG tablet TAKE 1/2 TABLET BY MOUTH ONCE A DAY FOR ONE WEEK THEN 1 TABLET BY MOUTH DAILY 02/18/18   [provider]  Skin Protectants, Misc. (EUCERIN) cream Apply 1 application topically daily as needed for dry skin.    [provider]  vitamin C (ASCORBIC ACID) 500 MG tablet Take 1 tablet (500 mg total) by mouth daily. 01/21/16   OIran Planas MD    Allergies    Amoxicillin  Review of Systems   Review of Systems  All other systems reviewed and are negative.  Physical Exam Updated Vital Signs BP 132/75 (BP Location: Left Arm)   Pulse 73   Temp 97.6 F (36.4  C) (Oral) Comment: Pt just had water for medication  Resp 17   Ht 1.6 m ('5\' 3"'$ )   Wt 81.6 kg   SpO2 98%   BMI 31.89 kg/m   Physical Exam Vitals and nursing note reviewed.  Constitutional:      General: She is not in acute distress.    Appearance: Normal appearance. She is well-developed. She is not diaphoretic.  HENT:     Head: Normocephalic and atraumatic. No raccoon eyes or Battle's sign.     Right Ear: External ear normal.     Left Ear: External ear normal.  Eyes:     General:  Lids are normal.        Right eye: No discharge.     Conjunctiva/sclera:     Right eye: No hemorrhage.    Left eye: No hemorrhage. Neck:     Trachea: No tracheal deviation.  Cardiovascular:     Rate and Rhythm: Normal rate and regular rhythm.     Heart sounds: Normal heart sounds.  Pulmonary:     Effort: Pulmonary effort is normal. No respiratory distress.     Breath sounds: Normal breath sounds. No stridor.  Chest:     Chest wall: No tenderness.  Abdominal:     General: Bowel sounds are normal. There is no distension.     Palpations: Abdomen is soft. There is no mass.     Tenderness: There is no abdominal tenderness.     Comments:    Musculoskeletal:     Right shoulder: Tenderness and bony tenderness present. Decreased range of motion.     Right elbow: No deformity. Tenderness present.     Right wrist: Normal.     Cervical back: No swelling, edema, deformity or tenderness. No spinous process tenderness.     Thoracic back: No swelling, deformity or tenderness.     Lumbar back: No swelling or tenderness.     Right hip: No deformity. Normal range of motion.     Comments: Pelvis stable, pain with range of motion of the right lower extremity in the right hip groin area  Neurological:     Mental Status: She is alert.     GCS: GCS eye subscore is 4. GCS verbal subscore is 5. GCS motor subscore is 6.     Sensory: No sensory deficit.     Motor: No abnormal muscle tone.     Comments: Able to move  all extremities, sensation intact throughout  Psychiatric:        Mood and Affect: Mood normal.        Speech: Speech normal.        Behavior: Behavior normal.    ED Results / Procedures / Treatments   Labs (all labs ordered are listed, but only abnormal results are displayed) Labs Reviewed - No data to display  EKG None  Radiology DG Shoulder Right  Result Date: 07/07/2021 CLINICAL DATA:  Right shoulder pain after fall. EXAM: RIGHT SHOULDER - 2+ VIEW COMPARISON:  None. FINDINGS: Comminuted and impacted fracture is seen involving the right humeral neck. No dislocation is noted. IMPRESSION: Comminuted and impacted right humeral neck fracture. Electronically Signed   By: Marijo Conception M.D.   On: 07/07/2021 15:26   DG Elbow Complete Right  Result Date: 07/07/2021 CLINICAL DATA:  Right elbow pain after fall. EXAM: RIGHT ELBOW - COMPLETE 3+ VIEW COMPARISON:  None. FINDINGS: There is no evidence of fracture, dislocation, or joint effusion. There is no evidence of arthropathy or other focal bone abnormality. Soft tissues are unremarkable. IMPRESSION: Negative. Electronically Signed   By: Marijo Conception M.D.   On: 07/07/2021 15:24   CT PELVIS WO CONTRAST  Result Date: 07/07/2021 CLINICAL DATA:  Right hip pain after fall. EXAM: CT PELVIS WITHOUT CONTRAST TECHNIQUE: Multidetector CT imaging of the pelvis was performed following the standard protocol without intravenous contrast. COMPARISON:  January 19, 2021. FINDINGS: Urinary Tract:  No abnormality visualized. Bowel:  Unremarkable visualized pelvic bowel loops. Vascular/Lymphatic: No pathologically enlarged lymph nodes. No significant vascular abnormality seen. Reproductive: Status post hysterectomy. No adnexal abnormality is noted. Other: Small hematoma is seen anteriorly  and to the right of the urinary bladder in the pelvis. Musculoskeletal: Minimally displaced fracture is seen involving the right superior pubic ramus. Hip joints are  unremarkable. IMPRESSION: Minimally displaced fracture is seen involving the right inferior pubic ramus. There is an adjacent small hematoma which is anterior to the right of the urinary bladder in the pelvis. Electronically Signed   By: Marijo Conception M.D.   On: 07/07/2021 16:25   DG Hip Unilat  With Pelvis 2-3 Views Right  Result Date: 07/07/2021 CLINICAL DATA:  Right hip pain after fall today. EXAM: DG HIP (WITH OR WITHOUT PELVIS) 2-3V RIGHT COMPARISON:  None. FINDINGS: There is no evidence of hip fracture or dislocation. There is no evidence of arthropathy or other focal bone abnormality. IMPRESSION: Negative. Electronically Signed   By: Marijo Conception M.D.   On: 07/07/2021 15:23    Procedures Procedures   Medications Ordered in ED Medications  HYDROcodone-acetaminophen (NORCO/VICODIN) 5-325 MG per tablet 1 tablet (1 tablet Oral Given 07/07/21 1609)    ED Course  I have reviewed the triage vital signs and the nursing notes.  Pertinent labs & imaging results that were available during my care of the patient were reviewed by me and considered in my medical decision making (see chart for details).  Clinical Course as of 07/08/21 2153  Fri Jul 07, 2021  1553 X-ray shows proximal humerus fracture in the surgical neck.  Elbow and hip without acute fracture. [id number] With the patient's persistent pain will CT to rule out occult pelvic fracture [JK]  1634 CT scan negative for hip fracture [JK]    Clinical Course User Index [JK] Dorie Rank, MD   MDM Rules/Calculators/A&P                           Patient presented to the ED for evaluation of hip shoulder and elbow pain after a fall.  Shoulder x-ray does show evidence of a proximal humerus fracture.  Patient is otherwise neurovascular intact.  We will treat with a sling in the ED.  Outpatient follow-up with orthopedics.  Patient previously has seen Dr. Onnie Graham for shoulder injuries.  Patient also was complaining of hip and pelvis pain.   Plain film did not show any hip fracture.  Did proceed with CT scan to look for evidence of fracture or not noted on the plain films.  No signs of  hip fracture on CT.  There is still a remote possibility of fracture not noted on CT scan.  Patient however has been able to ambulate.  MRI is not available at this facility I do not feel that she needs that done emergently.  We will have her follow-up with her orthopedic doctor. Final Clinical Impression(s) / ED Diagnoses Final diagnoses:  Closed fracture of proximal end of right humerus, unspecified fracture morphology, initial encounter  Contusion of right hip, initial encounter    Rx / DC Orders ED Discharge Orders          Ordered    HYDROcodone-acetaminophen (NORCO/VICODIN) 5-325 MG tablet  Every 6 hours PRN        07/07/21 1653             Dorie Rank, MD 07/07/21 1655 Addendum to chart.  CT did demonstrate right inferior pubic ramus fracture.  Hip fracture not noted   Dorie Rank, MD 07/08/21 2153

## 2021-07-07 NOTE — Discharge Instructions (Addendum)
Wear the sling at all times.  Apply ice to help with the swelling.  Take the medications as needed for pain.

## 2021-07-07 NOTE — ED Triage Notes (Signed)
"  Tripped and fell on right side, now having right shoulder, right hip, right elbow pain" per pt  Denies head injury or LOC

## 2021-07-07 NOTE — ED Notes (Signed)
Patient transported to CT 

## 2021-07-08 ENCOUNTER — Encounter (HOSPITAL_COMMUNITY): Payer: Self-pay | Admitting: Internal Medicine

## 2021-07-08 ENCOUNTER — Inpatient Hospital Stay (HOSPITAL_COMMUNITY)
Admission: EM | Admit: 2021-07-08 | Discharge: 2021-07-12 | DRG: 563 | Disposition: A | Discharge status: Skilled Nursing Facility | Payer: PPO | Source: Other Acute Inpatient Hospital | Attending: Internal Medicine | Admitting: Internal Medicine

## 2021-07-08 ENCOUNTER — Other Ambulatory Visit: Payer: Self-pay

## 2021-07-08 DIAGNOSIS — R5381 Other malaise: Secondary | ICD-10-CM | POA: Diagnosis not present

## 2021-07-08 DIAGNOSIS — E038 Other specified hypothyroidism: Secondary | ICD-10-CM | POA: Diagnosis not present

## 2021-07-08 DIAGNOSIS — H04123 Dry eye syndrome of bilateral lacrimal glands: Secondary | ICD-10-CM | POA: Diagnosis not present

## 2021-07-08 DIAGNOSIS — F339 Major depressive disorder, recurrent, unspecified: Secondary | ICD-10-CM | POA: Diagnosis not present

## 2021-07-08 DIAGNOSIS — E669 Obesity, unspecified: Secondary | ICD-10-CM | POA: Diagnosis present

## 2021-07-08 DIAGNOSIS — S42211D Unspecified displaced fracture of surgical neck of right humerus, subsequent encounter for fracture with routine healing: Secondary | ICD-10-CM | POA: Diagnosis not present

## 2021-07-08 DIAGNOSIS — H04129 Dry eye syndrome of unspecified lacrimal gland: Secondary | ICD-10-CM | POA: Diagnosis not present

## 2021-07-08 DIAGNOSIS — S32591S Other specified fracture of right pubis, sequela: Secondary | ICD-10-CM | POA: Diagnosis not present

## 2021-07-08 DIAGNOSIS — Z79899 Other long term (current) drug therapy: Secondary | ICD-10-CM | POA: Diagnosis not present

## 2021-07-08 DIAGNOSIS — M79672 Pain in left foot: Secondary | ICD-10-CM | POA: Diagnosis not present

## 2021-07-08 DIAGNOSIS — R52 Pain, unspecified: Secondary | ICD-10-CM | POA: Diagnosis not present

## 2021-07-08 DIAGNOSIS — S42211A Unspecified displaced fracture of surgical neck of right humerus, initial encounter for closed fracture: Secondary | ICD-10-CM | POA: Diagnosis not present

## 2021-07-08 DIAGNOSIS — Z8616 Personal history of COVID-19: Secondary | ICD-10-CM

## 2021-07-08 DIAGNOSIS — S42211S Unspecified displaced fracture of surgical neck of right humerus, sequela: Secondary | ICD-10-CM | POA: Diagnosis not present

## 2021-07-08 DIAGNOSIS — J309 Allergic rhinitis, unspecified: Secondary | ICD-10-CM | POA: Diagnosis not present

## 2021-07-08 DIAGNOSIS — Z833 Family history of diabetes mellitus: Secondary | ICD-10-CM

## 2021-07-08 DIAGNOSIS — G4700 Insomnia, unspecified: Secondary | ICD-10-CM | POA: Diagnosis not present

## 2021-07-08 DIAGNOSIS — K52831 Collagenous colitis: Secondary | ICD-10-CM | POA: Diagnosis not present

## 2021-07-08 DIAGNOSIS — Z20822 Contact with and (suspected) exposure to covid-19: Secondary | ICD-10-CM | POA: Diagnosis present

## 2021-07-08 DIAGNOSIS — Z7401 Bed confinement status: Secondary | ICD-10-CM | POA: Diagnosis not present

## 2021-07-08 DIAGNOSIS — R262 Difficulty in walking, not elsewhere classified: Secondary | ICD-10-CM | POA: Diagnosis present

## 2021-07-08 DIAGNOSIS — S32591A Other specified fracture of right pubis, initial encounter for closed fracture: Secondary | ICD-10-CM | POA: Diagnosis present

## 2021-07-08 DIAGNOSIS — M8589 Other specified disorders of bone density and structure, multiple sites: Secondary | ICD-10-CM | POA: Diagnosis not present

## 2021-07-08 DIAGNOSIS — Z8249 Family history of ischemic heart disease and other diseases of the circulatory system: Secondary | ICD-10-CM | POA: Diagnosis not present

## 2021-07-08 DIAGNOSIS — F419 Anxiety disorder, unspecified: Secondary | ICD-10-CM | POA: Diagnosis not present

## 2021-07-08 DIAGNOSIS — R4789 Other speech disturbances: Secondary | ICD-10-CM | POA: Diagnosis not present

## 2021-07-08 DIAGNOSIS — S32501A Unspecified fracture of right pubis, initial encounter for closed fracture: Secondary | ICD-10-CM | POA: Diagnosis not present

## 2021-07-08 DIAGNOSIS — G2581 Restless legs syndrome: Secondary | ICD-10-CM | POA: Diagnosis not present

## 2021-07-08 DIAGNOSIS — R2681 Unsteadiness on feet: Secondary | ICD-10-CM | POA: Diagnosis not present

## 2021-07-08 DIAGNOSIS — K219 Gastro-esophageal reflux disease without esophagitis: Secondary | ICD-10-CM | POA: Diagnosis present

## 2021-07-08 DIAGNOSIS — M6281 Muscle weakness (generalized): Secondary | ICD-10-CM | POA: Diagnosis not present

## 2021-07-08 DIAGNOSIS — Z6831 Body mass index (BMI) 31.0-31.9, adult: Secondary | ICD-10-CM

## 2021-07-08 DIAGNOSIS — Z880 Allergy status to penicillin: Secondary | ICD-10-CM

## 2021-07-08 DIAGNOSIS — E039 Hypothyroidism, unspecified: Secondary | ICD-10-CM | POA: Diagnosis present

## 2021-07-08 DIAGNOSIS — I959 Hypotension, unspecified: Secondary | ICD-10-CM | POA: Diagnosis not present

## 2021-07-08 DIAGNOSIS — Z79890 Hormone replacement therapy (postmenopausal): Secondary | ICD-10-CM | POA: Diagnosis not present

## 2021-07-08 DIAGNOSIS — R2689 Other abnormalities of gait and mobility: Secondary | ICD-10-CM | POA: Diagnosis not present

## 2021-07-08 DIAGNOSIS — W010XXA Fall on same level from slipping, tripping and stumbling without subsequent striking against object, initial encounter: Secondary | ICD-10-CM | POA: Diagnosis present

## 2021-07-08 DIAGNOSIS — S32591D Other specified fracture of right pubis, subsequent encounter for fracture with routine healing: Secondary | ICD-10-CM | POA: Diagnosis not present

## 2021-07-08 DIAGNOSIS — W19XXXA Unspecified fall, initial encounter: Secondary | ICD-10-CM | POA: Diagnosis not present

## 2021-07-08 DIAGNOSIS — S72001B Fracture of unspecified part of neck of right femur, initial encounter for open fracture type I or II: Secondary | ICD-10-CM | POA: Diagnosis not present

## 2021-07-08 DIAGNOSIS — Y9373 Activity, racquet and hand sports: Secondary | ICD-10-CM | POA: Diagnosis not present

## 2021-07-08 HISTORY — DX: Microscopic colitis, unspecified: K52.839

## 2021-07-08 LAB — CBC
HCT: 44.7 % (ref 36.0–46.0)
Hemoglobin: 14.8 g/dL (ref 12.0–15.0)
MCH: 32.3 pg (ref 26.0–34.0)
MCHC: 33.1 g/dL (ref 30.0–36.0)
MCV: 97.6 fL (ref 80.0–100.0)
Platelets: 200 10*3/uL (ref 150–400)
RBC: 4.58 MIL/uL (ref 3.87–5.11)
RDW: 14.6 % (ref 11.5–15.5)
WBC: 8.4 10*3/uL (ref 4.0–10.5)
nRBC: 0 % (ref 0.0–0.2)

## 2021-07-08 LAB — BASIC METABOLIC PANEL
Anion gap: 9 (ref 5–15)
BUN: 14 mg/dL (ref 8–23)
CO2: 24 mmol/L (ref 22–32)
Calcium: 9.2 mg/dL (ref 8.9–10.3)
Chloride: 104 mmol/L (ref 98–111)
Creatinine, Ser: 0.98 mg/dL (ref 0.44–1.00)
GFR, Estimated: 58 mL/min — ABNORMAL LOW (ref >60)
Glucose, Bld: 109 mg/dL — ABNORMAL HIGH (ref 70–99)
Potassium: 4 mmol/L (ref 3.5–5.1)
Sodium: 137 mmol/L (ref 135–145)

## 2021-07-08 MED ORDER — MORPHINE SULFATE (PF) 2 MG/ML IV SOLN: 2.0000 mg | INTRAVENOUS | Status: DC | PRN

## 2021-07-08 MED ORDER — BISACODYL 5 MG PO TBEC: 5.0000 mg | DELAYED_RELEASE_TABLET | Freq: Every day | ORAL | Status: DC | PRN

## 2021-07-08 MED ORDER — HYDROCODONE-ACETAMINOPHEN 5-325 MG PO TABS
1.0000 | ORAL_TABLET | ORAL | Status: DC | PRN
  Administered 2021-07-09: 1 via ORAL
  Filled 2021-07-08: qty 1

## 2021-07-08 MED ORDER — ONDANSETRON HCL 4 MG/2ML IJ SOLN: 4.0000 mg | Freq: Four times a day (QID) | INTRAMUSCULAR | Status: DC | PRN

## 2021-07-08 MED ORDER — ACETAMINOPHEN 650 MG RE SUPP: 650.0000 mg | Freq: Four times a day (QID) | RECTAL | Status: DC | PRN

## 2021-07-08 MED ORDER — SENNOSIDES-DOCUSATE SODIUM 8.6-50 MG PO TABS: 1.0000 | ORAL_TABLET | Freq: Every evening | ORAL | Status: DC | PRN

## 2021-07-08 MED ORDER — ONDANSETRON HCL 4 MG PO TABS: 4.0000 mg | ORAL_TABLET | Freq: Four times a day (QID) | ORAL | Status: DC | PRN

## 2021-07-08 MED ORDER — MORPHINE SULFATE (PF) 4 MG/ML IV SOLN
4.0000 mg | Freq: Once | INTRAVENOUS | Status: AC
  Administered 2021-07-08: 4 mg via INTRAVENOUS
  Filled 2021-07-08: qty 1

## 2021-07-08 MED ORDER — ACETAMINOPHEN 325 MG PO TABS: 650.0000 mg | ORAL_TABLET | Freq: Four times a day (QID) | ORAL | Status: DC | PRN

## 2021-07-08 MED ORDER — ENOXAPARIN SODIUM 40 MG/0.4ML IJ SOSY
40.0000 mg | PREFILLED_SYRINGE | INTRAMUSCULAR | Status: DC
  Administered 2021-07-10 – 2021-07-11 (×2): 40 mg via SUBCUTANEOUS
  Filled 2021-07-08 (×2): qty 0.4

## 2021-07-08 NOTE — ED Triage Notes (Signed)
Pt BIB by EMS to having continued right hip and shoulder pain. Pt fell yesterday and was seen at [facility]. Pt had imaging of shoulder and hip yesterday. Per pt, PO pain meds are not helping with pain.

## 2021-07-08 NOTE — H&P (Signed)
History and Physical    Kylie Christensen [id number] DOB: 06/15/1941 DOA: 07/08/2021  PCP: Josetta Huddle, MD  Patient coming from: Home  I have personally briefly reviewed patient's old medical records in [facility].  Chief Complaint: pain in right pelvis and arm after fall  HPI: Kylie Christensen is a 80 y.o. female with medical history significant for hypothyroidism who presents to the emergency department on 07/08/2021 with right pelvis/hip and shoulder pain. Onset of patient's symptoms was 07/07/2021 after falling while playing pickle ball and duration is intermittent. She reached down to hit the ball and shoe caught; she fell and landed on her right shoulder. As the day progressed she was hurting in her right shoulder and right hip. She went to the emergency department on the day of the fall, was sent home with oral pain medication. Regarding the right shoulder: Pain is located in the right shoulder and radiates to right elbow and sometimes to wrist. It is up to 9-10/10 at times and is characterized as throbbing. Symptoms are alleviated by nothing and exacerbated by moving her arm. Regarding the right hip: Pain is located right hip/ right groin and radiates to the entire leg. It is up to 9/10 at times and is characterized as aching and also sharp. Symptoms are alleviated by nothing and exacerbated by weight bearing or trying to walk. Associated symptoms: Unable to walk. No shortness of breath, cough, or wheezing. No chest pain or palpitations. No abdominal pain, vomiting, or diarrhea. History: She had Covid diagnosed Memorial Day weekend 2022 and recovered without difficulty. Treatments: Patient was taking the home pain medication as prescribed for her shoulder and hip area but it was not helping, so she presented to the emergency department.  ED Course: Imaging reveals right arm fracture and right inferior ramus fracture. Patient is requiring IV morphine for relief of her pain.  Review of  Systems: As per HPI otherwise all other systems reviewed and are unremarable.  GI: No abdominal pain, nausea, vomiting, diarrhea, constipation, or bloody stool.  GENITOURINARY: No dysuria or hematuria.    Past Medical History:  Diagnosis Date   Anxiety    Colitis    GERD (gastroesophageal reflux disease)    Headache    Hypothyroidism     Past Surgical History:  Procedure Laterality Date   ABDOMINAL HYSTERECTOMY     partial   BACK SURGERY  nov 2011   lower back   BALLOON DILATION N/A 01/25/2015   Procedure: BALLOON DILATION;  Surgeon: Garlan Fair, MD;  Location: [facility];  Service: Endoscopy;  Laterality: N/A;   BIOPSY BREAST Left yrs ago   benign   COLONOSCOPY WITH PROPOFOL N/A 01/25/2015   Procedure: COLONOSCOPY WITH PROPOFOL;  Surgeon: Garlan Fair, MD;  Location: [facility];  Service: Endoscopy;  Laterality: N/A;   ESOPHAGOGASTRODUODENOSCOPY (EGD) WITH PROPOFOL N/A 01/25/2015   Procedure: ESOPHAGOGASTRODUODENOSCOPY (EGD) WITH PROPOFOL;  Surgeon: Garlan Fair, MD;  Location: [facility];  Service: Endoscopy;  Laterality: N/A;   KNEE SURGERY     OPEN REDUCTION INTERNAL FIXATION (ORIF) DISTAL RADIAL FRACTURE Left 01/21/2016   Procedure: OPEN REDUCTION INTERNAL FIXATION (ORIF) DISTAL RADIAL FRACTURE;  Surgeon: Iran Planas, MD;  Location: [place];  Service: Orthopedics;  Laterality: Left;   rotator cuffr Right    SHOULDER ARTHROSCOPY     thyroid nodule removed  1970   TONSILLECTOMY  age 9   WRIST FRACTURE SURGERY      Social History  reports that she  has never smoked. She has never used smokeless tobacco. She reports current alcohol use. She reports that she does not use drugs.  Allergies  Allergen Reactions   Amoxicillin Diarrhea and Rash    Family History  Problem Relation Age of Onset   Diabetes Mother    Other Mother        Zollinger-Ellison syndrome   Heart attack Father 28       Died of MI at 46 yo.   Goiter Paternal Grandmother      Home  Medications  Prior to Admission medications   Medication Sig Start Date End Date Taking? Authorizing Provider  B Complex-C (B-COMPLEX WITH VITAMIN C) tablet Take 1 tablet by mouth daily.    [provider]  beta carotene w/minerals (OCUVITE) tablet Take 1 tablet by mouth daily.    [provider]  Budesonide ER 9 MG TB24 TAKE 1 TABLET BY MOUTH EVERY DAY IN THE MORNING 07/24/18   [provider]  calcium carbonate (OS-CAL - DOSED IN MG OF ELEMENTAL CALCIUM) 1250 (500 Ca) MG tablet Take 1 tablet by mouth.    [provider]  cholecalciferol (VITAMIN D) 1000 units tablet Take 2,000 Units by mouth daily.    [provider]  colestipol (COLESTID) 1 g tablet TAKE 2 TABLETS BY MOUTH AT NIGHT 07/31/18   [provider]  cyclobenzaprine (FLEXERIL) 5 MG tablet Take 5 mg by mouth 3 (three) times daily as needed. 04/03/18   [provider]  cyclobenzaprine (FLEXERIL) 5 MG tablet Take 1 tablet (5 mg total) by mouth 3 (three) times daily as needed for muscle spasms. 04/29/18   Garald Balding, MD  cycloSPORINE (RESTASIS) 0.05 % ophthalmic emulsion Place 1 drop into both eyes 2 (two) times daily.     [provider]  diclofenac sodium (VOLTAREN) 1 % GEL APPLY 4G TOPICALLY 4 TIMES DAILY TO LARGE JOINT AS NEEDED 02/16/19   Garald Balding, MD  docusate sodium (COLACE) 100 MG capsule Take 1 capsule (100 mg total) by mouth 2 (two) times daily. 01/21/16   Iran Planas, MD  FLUoxetine (PROZAC) 20 MG capsule Take 20 mg by mouth daily.    [provider]  gabapentin (NEURONTIN) 100 MG capsule TAKE ONE CAPSULE BY MOUTH AT BEDTIME 07/15/18   Garald Balding, MD  HYDROcodone-acetaminophen (NORCO/VICODIN) 5-325 MG tablet Take 1 tablet by mouth every 6 (six) hours as needed. 07/07/21   Dorie Rank, MD  levothyroxine (SYNTHROID, LEVOTHROID) 50 MCG tablet Take 25-50 mcg by mouth every other day. Alternates between 28mg and 50 mcg. Brand name only     [provider]  levothyroxine (SYNTHROID, LEVOTHROID) 50 MCG tablet Take by mouth.    [provider]  methocarbamol (ROBAXIN) 500 MG tablet Take 1 tablet (500 mg total) by mouth 4 (four) times daily. 01/21/16   OIran Planas MD  Multiple Vitamin (MULTIVITAMIN WITH MINERALS) TABS tablet Take 1 tablet by mouth daily.    [provider]  omega-3 acid ethyl esters (LOVAZA) 1 g capsule Take 2 g by mouth daily.    [provider]  oxyCODONE-acetaminophen (ROXICET) 5-325 MG tablet Take 1 tablet by mouth every 4 (four) hours as needed for severe pain. 01/21/16   OIran Planas MD  Polyvinyl Alcohol-Povidone (REFRESH OP) Apply 1 drop to eye 3 (three) times daily as needed (Dry eyes).    [provider]  sertraline (ZOLOFT) 50 MG tablet TAKE 1/2 TABLET BY MOUTH ONCE A DAY FOR ONE  WEEK THEN 1 TABLET BY MOUTH DAILY 02/18/18   [provider]  Skin Protectants, Misc. (EUCERIN) cream Apply 1 application topically daily as needed for dry skin.    [provider]  vitamin C (ASCORBIC ACID) 500 MG tablet Take 1 tablet (500 mg total) by mouth daily. 01/21/16   Iran Planas, MD    Physical Exam: Vitals:   07/08/21 2145 07/08/21 2200 07/08/21 2215 07/08/21 2230  BP: 117/62 121/73 132/73 132/73  Pulse: 88 97 94 86  Resp:    16  Temp:      TempSrc:      SpO2: 93% 97% 97% 97%  Weight:      Height:        Constitutional: Calm, ill-appearing, in acute distress from pain. Vitals:   07/08/21 2145 07/08/21 2200 07/08/21 2215 07/08/21 2230  BP: 117/62 121/73 132/73 132/73  Pulse: 88 97 94 86  Resp:    16  Temp:      TempSrc:      SpO2: 93% 97% 97% 97%  Weight:      Height:       Eyes: Pupils equal and round, lids and conjunctivae without icterus or erythema. ENMT: Mucous membranes are dry. Posterior pharynx clear of any exudate or lesions. Nares patent without discharge or bleeding.  Normocephalic, atraumatic.  Normal dentition.  Neck: normal,  supple, no masses, trachea midline.  Thyroid nontender, no masses appreciated, no thyromegaly. Respiratory: clear to auscultation bilaterally. Chest wall movements are symmetric. No wheezing, no crackles.  No rhonchi.  Normal respiratory effort. No accessory muscle use.  Cardiovascular: Regular rate and rhythm, no murmurs / rubs / gallops. Pulses: DP pulses 2+ bilaterally. No carotid bruits.  Capillary refill less than 3 seconds. Edema: None bilaterally. GI: soft, non-distended, normal active bowel sounds. No hepatosplenomegaly. No rigidity, rebound, or guarding. Non-tender. No masses palpated.  Musculoskeletal: no clubbing / cyanosis. No joint deformity in lower extremities. Good ROM, no contractures. Normal muscle tone.  No tenderness or deformity in the back bilaterally.Right shoulder and right elbow tenderness. Able to move fingers on right. Right inguinal/right hip area tenderness.  Integument: no rashes, lesions, ulcers. No induration. Clean, dry, intact. Neurologic: CN 2-12 grossly intact. Sensation grossly intact to light touch. DTR 2+ bilaterally.  Babinski: Toes downgoing bilaterally.  Strength 5/5 in left upper and left lower extremity. Strength: Right lower extremity patient unable to lift due to pain.  Intact rapid alternating movements on left; unable on right due to fracture.  No pronator drift on left; unable to test on right due to fracture. Psychiatric: Normal judgment and insight. Alert and oriented x 3. Normal mood.  Normal and appropriate affect. Lymphatic: No cervical lymphadenopathy. No supraclavicular lymphadenopathy.   Labs on Admission: I have personally reviewed the following labs and imaging studies.  CBC: Recent Labs  Lab 07/08/21 2200  WBC 8.4  HGB 14.8  HCT 44.7  MCV 97.6  PLT [id number]    Basic Metabolic Panel: Recent Labs  Lab 07/08/21 2200  NA 137  K 4.0  CL 104  CO2 24  GLUCOSE 109*  BUN 14  CREATININE 0.98  CALCIUM 9.2    GFR: Estimated Creatinine  Clearance: 45.6 mL/min (by C-G formula based on SCr of 0.98 mg/dL).   Radiological Exams on Admission:  Viewed personally.  DG Shoulder Right  Result Date: 07/07/2021 CLINICAL DATA:  Right shoulder pain after fall. EXAM: RIGHT SHOULDER - 2+ VIEW COMPARISON:  None. FINDINGS: Comminuted and impacted fracture is  seen involving the right humeral neck. No dislocation is noted. IMPRESSION: Comminuted and impacted right humeral neck fracture. Electronically Signed   By: Marijo Conception M.D.   On: 07/07/2021 15:26   DG Elbow Complete Right  Result Date: 07/07/2021 CLINICAL DATA:  Right elbow pain after fall. EXAM: RIGHT ELBOW - COMPLETE 3+ VIEW COMPARISON:  None. FINDINGS: There is no evidence of fracture, dislocation, or joint effusion. There is no evidence of arthropathy or other focal bone abnormality. Soft tissues are unremarkable. IMPRESSION: Negative. Electronically Signed   By: Marijo Conception M.D.   On: 07/07/2021 15:24   CT PELVIS WO CONTRAST  Result Date: 07/07/2021 CLINICAL DATA:  Right hip pain after fall. EXAM: CT PELVIS WITHOUT CONTRAST TECHNIQUE: Multidetector CT imaging of the pelvis was performed following the standard protocol without intravenous contrast. COMPARISON:  January 19, 2021. FINDINGS: Urinary Tract:  No abnormality visualized. Bowel:  Unremarkable visualized pelvic bowel loops. Vascular/Lymphatic: No pathologically enlarged lymph nodes. No significant vascular abnormality seen. Reproductive: Status post hysterectomy. No adnexal abnormality is noted. Other: Small hematoma is seen anteriorly and to the right of the urinary bladder in the pelvis. Musculoskeletal: Minimally displaced fracture is seen involving the right superior pubic ramus. Hip joints are unremarkable. IMPRESSION: Minimally displaced fracture is seen involving the right inferior pubic ramus. There is an adjacent small hematoma which is anterior to the right of the urinary bladder in the pelvis. Electronically  Signed   By: Marijo Conception M.D.   On: 07/07/2021 16:25   DG Hip Unilat  With Pelvis 2-3 Views Right  Result Date: 07/07/2021 CLINICAL DATA:  Right hip pain after fall today. EXAM: DG HIP (WITH OR WITHOUT PELVIS) 2-3V RIGHT COMPARISON:  None. FINDINGS: There is no evidence of hip fracture or dislocation. There is no evidence of arthropathy or other focal bone abnormality. IMPRESSION: Negative. Electronically Signed   By: Marijo Conception M.D.   On: 07/07/2021 15:23     Assessment/Plan Principal Problem:   Fracture of right inferior pubic ramus, closed, initial encounter [facility]) Active Problems:   Intractable pain   Fx humeral neck, right, closed, initial encounter   Hypothyroidism (acquired)   Ambulatory dysfunction   Dry eye syndrome    Principal Problem:    Fracture of right inferior pubic ramus, closed, initial encounter ([place]) Failed outpatient management. Plan: IV morphine. Orthopedic surgery consult. PT/OT consult.  Active Problems:    Intractable pain, acute Plan: IV morphine.    Fx humeral neck, right, closed, initial encounter Failed outpatient management. Plan: IV morphine. Orthopedic surgery consult. PT/OT consult.    Hypothyroidism (acquired) Plan: Continue home Synthroid.    Ambulatory dysfunction Acute. Due to pubic ramus fracture. Plan: PT.    Dry eye syndrome Plan Continue home eye drops.     DVT prophylaxis: Lovenox.  Code Status:   Full Code   Disposition Plan:   Patient is from:  Home  Anticipated DC to:  Home  Anticipated DC date:  07/09/21  Anticipated DC barriers: Need for IV pain medication.  Consults called:  ED consulted orthopedic surgeon, Dr. Marlou Sa, who will see the patient in the AM on 07/09/21.  Admission status:  Observation   Severity of Illness: The appropriate patient status for this patient is OBSERVATION. Observation status is judged to be reasonable and necessary in order to provide the required intensity of service to ensure the  patient's safety. The patient's presenting symptoms, physical exam findings, and initial radiographic and laboratory data  in the context of their medical condition is felt to place them at decreased risk for further clinical deterioration. Furthermore, it is anticipated that the patient will be medically stable for discharge from the hospital within 2 midnights of admission. The following factors support the patient status of observation.   " The patient's presenting symptoms include pain in right arm and right groin/hip with inability to walk.. " The physical exam findings include inability to lift right leg due to pain, tenderness of right arm and right groin/hip area.. " The initial radiographic and laboratory data are revealing fractures in right arm and right inferior pubic rami.    Tacey Ruiz MD [facility]  How to contact the [facility] Attending or Consulting provider [place] or covering provider during after hours [place], for this patient?   Check the care team in [facility] and look for a) attending/consulting [facility] provider listed and b) the [facility] team listed Log into [email or web address] and use [place] universal password to access. If you do not have the password, please contact the hospital operator. Locate the [facility] provider you are looking for under [facility] and page to a number that you can be directly reached. If you still have difficulty reaching the provider, please page the [facility] (Director on Call) for the Hospitalists listed on amion for assistance.  07/08/2021, 10:52 PM

## 2021-07-08 NOTE — ED Provider Notes (Signed)
[facility] DEPT Provider Note   CSN: [id number] Arrival date & time: 07/08/21  2110     History Chief Complaint  Patient presents with   Hip Pain   Shoulder Pain    Kylie Christensen is a 80 y.o. female.   Hip Pain  Shoulder Pain  Patient was playing pickle ball yesterday when she stumbled and fell.  Patient ended up landing on her right side.  She ended up injuring her shoulder and her hip.  Patient initially was able to bear weight and walk.  She was seen in the emergency room yesterday by me at [facility].  Patient had x-rays of the shoulder that showed a proximal humerus fracture.  She had a CT scan of her pelvis that demonstrated a pubic rami fracture but no hip fracture.  Patient was given pain medications and discharged with plans for outpatient orthopedic follow-up.  Patient returns because she is unable to care for herself at home.  He is having too much pain with walking as well as pain in her shoulder.  She has been taking the hydrocodone without relief  Past Medical History:  Diagnosis Date   Anxiety    Colitis    GERD (gastroesophageal reflux disease)    Headache    Hypothyroidism     Patient Active Problem List   Diagnosis Date Noted   Closed displaced comminuted fracture of right patella 03/31/2018   Distal radius fracture, left 01/21/2016    Past Surgical History:  Procedure Laterality Date   ABDOMINAL HYSTERECTOMY     partial   BACK SURGERY  nov 2011   lower back   BALLOON DILATION N/A 01/25/2015   Procedure: BALLOON DILATION;  Surgeon: Garlan Fair, MD;  Location: [facility];  Service: Endoscopy;  Laterality: N/A;   BIOPSY BREAST Left yrs ago   benign   COLONOSCOPY WITH PROPOFOL N/A 01/25/2015   Procedure: COLONOSCOPY WITH PROPOFOL;  Surgeon: Garlan Fair, MD;  Location: [facility];  Service: Endoscopy;  Laterality: N/A;   ESOPHAGOGASTRODUODENOSCOPY (EGD) WITH PROPOFOL N/A 01/25/2015    Procedure: ESOPHAGOGASTRODUODENOSCOPY (EGD) WITH PROPOFOL;  Surgeon: Garlan Fair, MD;  Location: [facility];  Service: Endoscopy;  Laterality: N/A;   KNEE SURGERY     OPEN REDUCTION INTERNAL FIXATION (ORIF) DISTAL RADIAL FRACTURE Left 01/21/2016   Procedure: OPEN REDUCTION INTERNAL FIXATION (ORIF) DISTAL RADIAL FRACTURE;  Surgeon: Iran Planas, MD;  Location: [facility];  Service: Orthopedics;  Laterality: Left;   rotator cuffr Right    SHOULDER ARTHROSCOPY     thyroid nodule removed  1970   TONSILLECTOMY  age 80   WRIST FRACTURE SURGERY       OB History   No obstetric history on file.     No family history on file.  Social History   Tobacco Use   Smoking status: Never   Smokeless tobacco: Current    Types: Chew  Vaping Use   Vaping Use: Never used  Substance Use Topics   Alcohol use: Yes    Comment: occasional   Drug use: No    Home Medications Prior to Admission medications   Medication Sig Start Date End Date Taking? Authorizing Provider  B Complex-C (B-COMPLEX WITH VITAMIN C) tablet Take 1 tablet by mouth daily.    [provider]  beta carotene w/minerals (OCUVITE) tablet Take 1 tablet by mouth daily.    [provider]  Budesonide ER 9 MG TB24 TAKE 1 TABLET BY MOUTH EVERY  DAY IN THE MORNING 07/24/18   [provider]  calcium carbonate (OS-CAL - DOSED IN MG OF ELEMENTAL CALCIUM) 1250 (500 Ca) MG tablet Take 1 tablet by mouth.    [provider]  cholecalciferol (VITAMIN D) 1000 units tablet Take 2,000 Units by mouth daily.    [provider]  colestipol (COLESTID) 1 g tablet TAKE 2 TABLETS BY MOUTH AT NIGHT 07/31/18   [provider]  cyclobenzaprine (FLEXERIL) 5 MG tablet Take 5 mg by mouth 3 (three) times daily as needed. 04/03/18   [provider]  cyclobenzaprine (FLEXERIL) 5 MG tablet Take 1 tablet (5 mg total) by mouth 3 (three) times daily as needed for muscle spasms. 04/29/18   Garald Balding, MD   cycloSPORINE (RESTASIS) 0.05 % ophthalmic emulsion Place 1 drop into both eyes 2 (two) times daily.     [provider]  diclofenac sodium (VOLTAREN) 1 % GEL APPLY 4G TOPICALLY 4 TIMES DAILY TO LARGE JOINT AS NEEDED 02/16/19   Garald Balding, MD  docusate sodium (COLACE) 100 MG capsule Take 1 capsule (100 mg total) by mouth 2 (two) times daily. 01/21/16   Iran Planas, MD  FLUoxetine (PROZAC) 20 MG capsule Take 20 mg by mouth daily.    [provider]  gabapentin (NEURONTIN) 100 MG capsule TAKE ONE CAPSULE BY MOUTH AT BEDTIME 07/15/18   Garald Balding, MD  HYDROcodone-acetaminophen (NORCO/VICODIN) 5-325 MG tablet Take 1 tablet by mouth every 6 (six) hours as needed. 07/07/21   Dorie Rank, MD  levothyroxine (SYNTHROID, LEVOTHROID) 50 MCG tablet Take 25-50 mcg by mouth every other day. Alternates between 67mg and 50 mcg. Brand name only    [provider]  levothyroxine (SYNTHROID, LEVOTHROID) 50 MCG tablet Take by mouth.    [provider]  methocarbamol (ROBAXIN) 500 MG tablet Take 1 tablet (500 mg total) by mouth 4 (four) times daily. 01/21/16   OIran Planas MD  Multiple Vitamin (MULTIVITAMIN WITH MINERALS) TABS tablet Take 1 tablet by mouth daily.    [provider]  omega-3 acid ethyl esters (LOVAZA) 1 g capsule Take 2 g by mouth daily.    [provider]  oxyCODONE-acetaminophen (ROXICET) 5-325 MG tablet Take 1 tablet by mouth every 4 (four) hours as needed for severe pain. 01/21/16   OIran Planas MD  Polyvinyl Alcohol-Povidone (REFRESH OP) Apply 1 drop to eye 3 (three) times daily as needed (Dry eyes).    [provider]  sertraline (ZOLOFT) 50 MG tablet TAKE 1/2 TABLET BY MOUTH ONCE A DAY FOR ONE WEEK THEN 1 TABLET BY MOUTH DAILY 02/18/18   [provider]  Skin Protectants, Misc. (EUCERIN) cream Apply 1 application topically daily as needed for dry skin.    [provider]  vitamin C (ASCORBIC ACID) 500 MG  tablet Take 1 tablet (500 mg total) by mouth daily. 01/21/16   OIran Planas MD    Allergies    Amoxicillin  Review of Systems   Review of Systems  All other systems reviewed and are negative.  Physical Exam Updated Vital Signs BP 117/67 (BP Location: Left Arm)   Pulse 84   Temp 98.2 F (36.8 C) (Oral)   Resp 16   Ht 1.6 m ('5\' 3"'$ )   Wt 81.6 kg   SpO2 96%   BMI 31.89 kg/m   Physical Exam Vitals and nursing note reviewed.  Constitutional:      General: She is not in acute distress.  Appearance: She is well-developed.  HENT:     Head: Normocephalic and atraumatic.     Right Ear: External ear normal.     Left Ear: External ear normal.  Eyes:     General: No scleral icterus.       Right eye: No discharge.        Left eye: No discharge.     Conjunctiva/sclera: Conjunctivae normal.  Neck:     Trachea: No tracheal deviation.  Cardiovascular:     Rate and Rhythm: Normal rate and regular rhythm.  Pulmonary:     Effort: Pulmonary effort is normal. No respiratory distress.     Breath sounds: Normal breath sounds. No stridor. No wheezing or rales.  Abdominal:     General: Bowel sounds are normal. There is no distension.     Palpations: Abdomen is soft.     Tenderness: There is no abdominal tenderness. There is no guarding or rebound.  Musculoskeletal:        General: Tenderness present. No deformity.     Right shoulder: Tenderness present.     Cervical back: Neck supple.     Comments: Ttp right pubic region  Skin:    General: Skin is warm and dry.     Findings: No rash.  Neurological:     General: No focal deficit present.     Mental Status: She is alert.     Cranial Nerves: No cranial nerve deficit (no facial droop, extraocular movements intact, no slurred speech).     Sensory: No sensory deficit.     Motor: No abnormal muscle tone or seizure activity.     Coordination: Coordination normal.  Psychiatric:        Mood and Affect: Mood normal.    ED Results /  Procedures / Treatments   Labs (all labs ordered are listed, but only abnormal results are displayed) Labs Reviewed  SARS CORONAVIRUS 2 (TAT 6-24 HRS)  CBC  BASIC METABOLIC PANEL    EKG None  Radiology DG Shoulder Right  Result Date: 07/07/2021 CLINICAL DATA:  Right shoulder pain after fall. EXAM: RIGHT SHOULDER - 2+ VIEW COMPARISON:  None. FINDINGS: Comminuted and impacted fracture is seen involving the right humeral neck. No dislocation is noted. IMPRESSION: Comminuted and impacted right humeral neck fracture. Electronically Signed   By: Marijo Conception M.D.   On: 07/07/2021 15:26   DG Elbow Complete Right  Result Date: 07/07/2021 CLINICAL DATA:  Right elbow pain after fall. EXAM: RIGHT ELBOW - COMPLETE 3+ VIEW COMPARISON:  None. FINDINGS: There is no evidence of fracture, dislocation, or joint effusion. There is no evidence of arthropathy or other focal bone abnormality. Soft tissues are unremarkable. IMPRESSION: Negative. Electronically Signed   By: Marijo Conception M.D.   On: 07/07/2021 15:24   CT PELVIS WO CONTRAST  Result Date: 07/07/2021 CLINICAL DATA:  Right hip pain after fall. EXAM: CT PELVIS WITHOUT CONTRAST TECHNIQUE: Multidetector CT imaging of the pelvis was performed following the standard protocol without intravenous contrast. COMPARISON:  January 19, 2021. FINDINGS: Urinary Tract:  No abnormality visualized. Bowel:  Unremarkable visualized pelvic bowel loops. Vascular/Lymphatic: No pathologically enlarged lymph nodes. No significant vascular abnormality seen. Reproductive: Status post hysterectomy. No adnexal abnormality is noted. Other: Small hematoma is seen anteriorly and to the right of the urinary bladder in the pelvis. Musculoskeletal: Minimally displaced fracture is seen involving the right superior pubic ramus. Hip joints are unremarkable. IMPRESSION: Minimally displaced fracture is seen involving the right inferior  pubic ramus. There is an adjacent small hematoma  which is anterior to the right of the urinary bladder in the pelvis. Electronically Signed   By: Marijo Conception M.D.   On: 07/07/2021 16:25   DG Hip Unilat  With Pelvis 2-3 Views Right  Result Date: 07/07/2021 CLINICAL DATA:  Right hip pain after fall today. EXAM: DG HIP (WITH OR WITHOUT PELVIS) 2-3V RIGHT COMPARISON:  None. FINDINGS: There is no evidence of hip fracture or dislocation. There is no evidence of arthropathy or other focal bone abnormality. IMPRESSION: Negative. Electronically Signed   By: Marijo Conception M.D.   On: 07/07/2021 15:23    Procedures Procedures   Medications Ordered in ED Medications  morphine 4 MG/ML injection 4 mg (4 mg Intravenous Given 07/08/21 2157)    ED Course  I have reviewed the triage vital signs and the nursing notes.  Pertinent labs & imaging results that were available during my care of the patient were reviewed by me and considered in my medical decision making (see chart for details).  Clinical Course as of 07/10/21 [id number]  Sat Jul 08, 2021  2227 Discussed with Dr. Marlou Sa.  He will evaluate the patient tomorrow [JK]    Clinical Course User Index [JK] Dorie Rank, MD   MDM Rules/Calculators/A&P                           Patient presented to ED yesterday for evaluation after fall.  Patient was noted to have a proximal right humerus fracture.  X-rays did not show any acute hip fractures but CT scan did demonstrate a pubic rami fracture.  Patient was discharged home with pain medications.  Patient has been taking her medications for pain at home but has not offered relief.  We will treat with IV pain medications.  Anticipate patient will need to be admitted for pain management.  Possibly short-term rehab  Final Clinical Impression(s) / ED Diagnoses Final diagnoses:  Closed fracture of neck of right humerus, initial encounter  Closed fracture of ramus of right pubis, initial encounter ([place])     Dorie Rank, MD 07/10/21 [id number]

## 2021-07-09 ENCOUNTER — Encounter (HOSPITAL_COMMUNITY): Payer: Self-pay | Admitting: Internal Medicine

## 2021-07-09 DIAGNOSIS — S32591A Other specified fracture of right pubis, initial encounter for closed fracture: Secondary | ICD-10-CM | POA: Diagnosis not present

## 2021-07-09 LAB — BASIC METABOLIC PANEL
Anion gap: 7 (ref 5–15)
BUN: 11 mg/dL (ref 8–23)
CO2: 26 mmol/L (ref 22–32)
Calcium: 8.6 mg/dL — ABNORMAL LOW (ref 8.9–10.3)
Chloride: 103 mmol/L (ref 98–111)
Creatinine, Ser: 0.88 mg/dL (ref 0.44–1.00)
GFR, Estimated: >60 mL/min (ref >60)
Glucose, Bld: 100 mg/dL — ABNORMAL HIGH (ref 70–99)
Potassium: 4.3 mmol/L (ref 3.5–5.1)
Sodium: 136 mmol/L (ref 135–145)

## 2021-07-09 LAB — CBC
HCT: 42.8 % (ref 36.0–46.0)
Hemoglobin: 13.9 g/dL (ref 12.0–15.0)
MCH: 32.1 pg (ref 26.0–34.0)
MCHC: 32.5 g/dL (ref 30.0–36.0)
MCV: 98.8 fL (ref 80.0–100.0)
Platelets: 187 10*3/uL (ref 150–400)
RBC: 4.33 MIL/uL (ref 3.87–5.11)
RDW: 14.6 % (ref 11.5–15.5)
WBC: 7.5 10*3/uL (ref 4.0–10.5)
nRBC: 0 % (ref 0.0–0.2)

## 2021-07-09 LAB — SARS CORONAVIRUS 2 (TAT 6-24 HRS): SARS Coronavirus 2: NEGATIVE

## 2021-07-09 MED ORDER — SERTRALINE HCL 50 MG PO TABS: 50.0000 mg | ORAL_TABLET | Freq: Every day | ORAL | Status: DC

## 2021-07-09 MED ORDER — ADULT MULTIVITAMIN W/MINERALS CH
1.0000 | ORAL_TABLET | Freq: Every day | ORAL | Status: DC
  Administered 2021-07-10 – 2021-07-11 (×2): 1 via ORAL
  Filled 2021-07-09 (×2): qty 1

## 2021-07-09 MED ORDER — BUDESONIDE 3 MG PO CPEP
9.0000 mg | ORAL_CAPSULE | Freq: Every day | ORAL | Status: DC
  Administered 2021-07-09 – 2021-07-11 (×3): 9 mg via ORAL
  Filled 2021-07-09 (×4): qty 3

## 2021-07-09 MED ORDER — OXYCODONE HCL 5 MG PO TABS
5.0000 mg | ORAL_TABLET | Freq: Four times a day (QID) | ORAL | Status: DC | PRN
  Administered 2021-07-10 – 2021-07-11 (×4): 5 mg via ORAL
  Filled 2021-07-09 (×4): qty 1

## 2021-07-09 MED ORDER — GABAPENTIN 100 MG PO CAPS: 100.0000 mg | ORAL_CAPSULE | Freq: Every day | ORAL | Status: DC

## 2021-07-09 MED ORDER — B COMPLEX-C PO TABS
1.0000 | ORAL_TABLET | Freq: Every day | ORAL | Status: DC
  Administered 2021-07-10 – 2021-07-11 (×2): 1 via ORAL
  Filled 2021-07-09 (×2): qty 1

## 2021-07-09 MED ORDER — CYCLOSPORINE 0.05 % OP EMUL
1.0000 [drp] | Freq: Two times a day (BID) | OPHTHALMIC | Status: DC
  Filled 2021-07-09 (×5): qty 1

## 2021-07-09 MED ORDER — VITAMIN D 25 MCG (1000 UNIT) PO TABS
2000.0000 [IU] | ORAL_TABLET | Freq: Every day | ORAL | Status: DC
  Administered 2021-07-10 – 2021-07-11 (×2): 2000 [IU] via ORAL
  Filled 2021-07-09 (×2): qty 2

## 2021-07-09 MED ORDER — TRAMADOL HCL 50 MG PO TABS
50.0000 mg | ORAL_TABLET | Freq: Four times a day (QID) | ORAL | Status: DC | PRN
Start: 2021-07-09 — End: 2021-07-12
  Administered 2021-07-10 – 2021-07-12 (×5): 50 mg via ORAL
  Filled 2021-07-09 (×6): qty 1

## 2021-07-09 MED ORDER — BUDESONIDE ER 9 MG PO TB24: 9.0000 mg | ORAL_TABLET | Freq: Every day | ORAL | Status: DC

## 2021-07-09 MED ORDER — POLYVINYL ALCOHOL 1.4 % OP SOLN: 1.0000 [drp] | OPHTHALMIC | Status: DC | PRN

## 2021-07-09 MED ORDER — POLYVINYL ALCOHOL-POVIDONE PF 1.4-0.6 % OP SOLN: 1.0000 [drp] | Freq: Three times a day (TID) | OPHTHALMIC | Status: DC | PRN

## 2021-07-09 MED ORDER — CYCLOBENZAPRINE HCL 10 MG PO TABS: 5.0000 mg | ORAL_TABLET | Freq: Three times a day (TID) | ORAL | Status: DC | PRN

## 2021-07-09 MED ORDER — SERTRALINE HCL 25 MG PO TABS
25.0000 mg | ORAL_TABLET | Freq: Every day | ORAL | Status: DC
  Administered 2021-07-09 – 2021-07-11 (×3): 25 mg via ORAL
  Filled 2021-07-09 (×3): qty 1

## 2021-07-09 MED ORDER — OMEGA-3-ACID ETHYL ESTERS 1 G PO CAPS: 2.0000 g | ORAL_CAPSULE | Freq: Every day | ORAL | Status: DC

## 2021-07-09 MED ORDER — ACETAMINOPHEN 500 MG PO TABS
1000.0000 mg | ORAL_TABLET | Freq: Three times a day (TID) | ORAL | Status: DC
  Administered 2021-07-09 – 2021-07-11 (×8): 1000 mg via ORAL
  Filled 2021-07-09 (×8): qty 2

## 2021-07-09 MED ORDER — LEVOTHYROXINE SODIUM 25 MCG PO TABS
25.0000 ug | ORAL_TABLET | ORAL | Status: DC
  Administered 2021-07-10: 25 ug via ORAL
  Filled 2021-07-09: qty 1

## 2021-07-09 MED ORDER — CALCIUM CARBONATE 1250 (500 CA) MG PO TABS
1.0000 | ORAL_TABLET | Freq: Every day | ORAL | Status: DC
  Administered 2021-07-10 – 2021-07-11 (×2): 500 mg via ORAL
  Filled 2021-07-09 (×3): qty 1

## 2021-07-09 MED ORDER — LEVOTHYROXINE SODIUM 50 MCG PO TABS
50.0000 ug | ORAL_TABLET | ORAL | Status: DC
  Administered 2021-07-11: 50 ug via ORAL
  Filled 2021-07-09: qty 1

## 2021-07-09 MED ORDER — COLESTIPOL HCL 1 G PO TABS
2.0000 g | ORAL_TABLET | Freq: Every day | ORAL | Status: DC
  Administered 2021-07-09 – 2021-07-11 (×3): 2 g via ORAL
  Filled 2021-07-09 (×3): qty 2

## 2021-07-09 MED ORDER — DICLOFENAC SODIUM 1 % TD GEL: 4.0000 g | Freq: Four times a day (QID) | TRANSDERMAL | Status: DC

## 2021-07-09 MED ORDER — MORPHINE SULFATE (PF) 2 MG/ML IV SOLN
2.0000 mg | INTRAVENOUS | Status: DC | PRN
  Administered 2021-07-09: 2 mg via INTRAVENOUS
  Filled 2021-07-09: qty 1

## 2021-07-09 MED ORDER — ACETAMINOPHEN 650 MG RE SUPP: 650.0000 mg | Freq: Three times a day (TID) | RECTAL | Status: DC

## 2021-07-09 NOTE — Evaluation (Signed)
Occupational Therapy Evaluation Patient Details Name: Kylie Christensen MRN: [id number] DOB: 24-Sep-1941 Today's Date: 07/09/2021    History of Present Illness Patient is an 80 year old female seen at [facility] 7/22 post fall playing pickleball resulting in R Comminuted and impacted humeral neck fracture and Minimally displaced fracture involving right inferior pubic ramus. Patient treated non operatively with sling for UE and ambulate as tolerated R LE and D/C home. Patient returned 7/23 due to increased pain and inability to ambulate. PMH includes patella fx, radial fx, back surgery   Clinical Impression   Patient resides at independent living facility [facility] home with her spouse on 2nd floor with elevator access. At baseline patient is independent with self care and mobility. Currently patient limited by pain in R UE and R LE impacting her overall activity tolerance, balance, safety. Patient needing min A for bed mobility and min A to slide feet along floor to head of bed. Provided walker for L UE support to attempt marching in place however patient unable to fully weight shift and take full weight through R LE due to pain. Patient also needing increased assistance with self care tasks due to immobilization of dominate R UE/pain. Educated patient on positioning of sling and how to prop with pillows or rolled blanket in bed to support at elbow as patient states feels like R arm is "sliding behind me." Would recommend short term rehab prior to D/C home as spouse is unable to provide extensive physical assistance. Acute OT to follow.     Follow Up Recommendations  SNF    Equipment Recommendations  3 in 1 bedside commode       Precautions / Restrictions Precautions Precautions: Fall Restrictions Weight Bearing Restrictions: Yes RUE Weight Bearing: Non weight bearing Other Position/Activity Restrictions: per note 7/22 treat UE with sling and "able to ambulate" R LE      Mobility Bed  Mobility Overal bed mobility: Needs Assistance Bed Mobility: Supine to Sit;Sit to Supine     Supine to sit: Min assist;HOB elevated Sit to supine: Min assist   General bed mobility comments: patient needing min A to upright trunk to sitting. able to scoot hips out to edge of bed. min A to assist lifting LEs back into bed    Transfers Overall transfer level: Needs assistance Equipment used: Rolling walker (2 wheeled) Transfers: Sit to/from Stand Sit to Stand: Min guard         General transfer comment: min G to power up to standing for safety, min A to slide feet along floor to edge of bed as patient unable to weight shift and clear feet from the floor. provided walker for L UE weight bearing however providing little assistance    Balance Overall balance assessment: History of Falls;Needs assistance Sitting-balance support: Feet supported Sitting balance-Leahy Scale: Good     Standing balance support: Single extremity supported Standing balance-Leahy Scale: Poor Standing balance comment: reliant on external assist                           ADL either performed or assessed with clinical judgement   ADL Overall ADL's : Needs assistance/impaired Eating/Feeding: Set up;Bed level   Grooming: Set up;Bed level   Upper Body Bathing: Sitting;Moderate assistance   Lower Body Bathing: Moderate assistance;Sitting/lateral leans;Sit to/from stand   Upper Body Dressing : Maximal assistance;Sitting   Lower Body Dressing: Maximal assistance;Sitting/lateral leans;Sit to/from stand Lower Body Dressing Details (indicate cue type  and reason): due to pelvic pain, limited dominate UE use 2* fracture and in sling, Toilet Transfer: Minimal assistance;RW Toilet Transfer Details (indicate cue type and reason): patient able to power up to standing from edge of bed however max difficulty trying to weight shift due to pelvic pain. walker provided for L UE weight bearing, patient needing  min A for stability to slide feet along the floor, unable to pick up. Toileting- Clothing Manipulation and Hygiene: Moderate assistance;Sitting/lateral lean;Sit to/from stand       Functional mobility during ADLs: Minimal assistance General ADL Comments: patient requiring increased assistance for self care tasks due to pain, UE restrictions limiting overall activity tolerance, balance, safety      Pertinent Vitals/Pain Pain Assessment: Faces Faces Pain Scale: Hurts even more Pain Location: R pelvis Pain Descriptors / Indicators: Aching;Grimacing;Discomfort Pain Intervention(s): Limited activity within patient's tolerance     Hand Dominance Right   Extremity/Trunk Assessment Upper Extremity Assessment Upper Extremity Assessment: RUE deficits/detail RUE Deficits / Details: R hand ROM intact RUE: Unable to fully assess due to immobilization   Lower Extremity Assessment Lower Extremity Assessment: Defer to PT evaluation       Communication Communication Communication: No difficulties   Cognition Arousal/Alertness: Awake/alert Behavior During Therapy: WFL for tasks assessed/performed Overall Cognitive Status: Within Functional Limits for tasks assessed                                                Home Living Family/patient expects to be discharged to:: Private residence Living Arrangements: Spouse/significant other Available Help at Discharge: Family Type of Home: Apartment (ILF) Home Access: Elevator     Home Layout: One level     Bathroom Shower/Tub: Occupational psychologist: Handicapped height     Home Equipment: Grab bars - tub/shower;Grab bars - toilet;Shower seat;Cane - single point          Prior Functioning/Environment Level of Independence: Independent                 OT Problem List: Decreased activity tolerance;Decreased range of motion;Impaired balance (sitting and/or standing);Decreased safety  awareness;Decreased knowledge of precautions;Decreased knowledge of use of DME or AE;Pain;Impaired UE functional use      OT Treatment/Interventions: Self-care/ADL training;Balance training;Patient/family education;Therapeutic activities;DME and/or AE instruction    OT Goals(Current goals can be found in the care plan section) Acute Rehab OT Goals Patient Stated Goal: return to independence OT Goal Formulation: With patient Time For Goal Achievement: 07/23/21 Potential to Achieve Goals: Good  OT Frequency: Min 2X/week   Barriers to D/C: Decreased caregiver support  patient reports spouse can provide limited physical assistance       Co-evaluation PT/OT/SLP Co-Evaluation/Treatment: Yes Reason for Co-Treatment: To address functional/ADL transfers PT goals addressed during session: Mobility/safety with mobility OT goals addressed during session: ADL's and self-care      AM-PAC OT "6 Clicks" Daily Activity     Outcome Measure Help from another person eating meals?: A Little Help from another person taking care of personal grooming?: A Little Help from another person toileting, which includes using toliet, bedpan, or urinal?: A Lot Help from another person bathing (including washing, rinsing, drying)?: A Lot Help from another person to put on and taking off regular upper body clothing?: A Lot Help from another person to put on and taking off regular lower body  clothing?: A Lot 6 Click Score: 14   End of Session Equipment Utilized During Treatment: Rolling walker;Other (comment) (sling) Nurse Communication: Mobility status  Activity Tolerance: Patient tolerated treatment well Patient left: in bed;with call bell/phone within reach  OT Visit Diagnosis: Other abnormalities of gait and mobility (R26.89);History of falling (Z91.81);Pain Pain - Right/Left: Right Pain - part of body: Arm;Hip                Time: 1006-1030 OT Time Calculation (min): 24 min Charges:  OT General  Charges $OT Visit: 1 Visit OT Evaluation $OT Eval Low Complexity: [place] OT OT pager: [place] 07/09/2021, 12:34 PM

## 2021-07-09 NOTE — Evaluation (Signed)
Physical Therapy Evaluation Patient Details Name: Kylie Christensen MRN: [id number] DOB: 10-04-1941 Today's Date: 07/09/2021   History of Present Illness  Patient is an 80 year old female seen at [facility] 7/22 post fall playing pickleball resulting in R Comminuted and impacted humeral neck fracture and Minimally displaced fracture involving right inferior pubic ramus. Patient treated non operatively with sling for UE and ambulate as tolerated R LE and D/C home. Patient returned 7/23 due to increased pain and inability to ambulate. PMH includes patella fx, radial fx, back surgery  Clinical Impression  Patient resides at independent living facility friend's home with her spouse on 2nd floor with elevator access. At baseline patient is independent with self care and mobility. Currently patient limited by pain in R UE and R LE impacting her overall activity tolerance, balance, safety. Patient needing min A for bed mobility and min A to slide feet along floor to head of bed. Provided walker for L UE support to attempt marching in place however patient unable to fully weight shift and take full weight through R LE due to pain.  Would recommend short term rehab prior to D/C home as spouse is unable to provide extensive physical assistance. Acute PT to follow.     Follow Up Recommendations SNF    Equipment Recommendations  None recommended by PT    Recommendations for Other Services       Precautions / Restrictions Precautions Precautions: Fall Restrictions Weight Bearing Restrictions: Yes RUE Weight Bearing: Non weight bearing Other Position/Activity Restrictions: per note 7/22 treat UE with sling and "able to ambulate" R LE      Mobility  Bed Mobility Overal bed mobility: Needs Assistance Bed Mobility: Supine to Sit;Sit to Supine     Supine to sit: Min assist;HOB elevated Sit to supine: Min assist   General bed mobility comments: patient needing min A to upright trunk to sitting.  able to scoot hips out to edge of bed. min A to assist lifting LEs back into bed    Transfers Overall transfer level: Needs assistance Equipment used: Rolling walker (2 wheeled) Transfers: Sit to/from Stand Sit to Stand: Min guard         General transfer comment: min G to power up to standing for safety  Ambulation/Gait Ambulation/Gait assistance: Min assist;+2 safety/equipment   Assistive device: Rolling walker (2 wheeled) Gait Pattern/deviations: Shuffle     General Gait Details: Pt unable to WB sufficiently on L UE and R LE to advance R LE.  Pt did slow heel/toe side-shuffle along side of bed to move up in bed  Stairs            Wheelchair Mobility    Modified Rankin (Stroke Patients Only)       Balance Overall balance assessment: History of Falls;Needs assistance Sitting-balance support: Feet supported Sitting balance-Leahy Scale: Good     Standing balance support: Single extremity supported Standing balance-Leahy Scale: Poor Standing balance comment: reliant on external assist                             Pertinent Vitals/Pain Pain Assessment: Faces Faces Pain Scale: Hurts even more Pain Location: R pelvis with WB Pain Descriptors / Indicators: Aching;Grimacing;Discomfort Pain Intervention(s): Limited activity within patient's tolerance;Monitored during session;Premedicated before session    Home Living Family/patient expects to be discharged to:: Private residence Living Arrangements: Spouse/significant other Available Help at Discharge: Family Type of Home: Apartment Home Access: Elevator  Home Layout: One level Home Equipment: Grab bars - tub/shower;Grab bars - toilet;Shower seat;Cane - single point Additional Comments: Home with spouse who has prior CVA and is limited in ability to assist    Prior Function Level of Independence: Independent               Hand Dominance   Dominant Hand: Right    Extremity/Trunk  Assessment   Upper Extremity Assessment Upper Extremity Assessment: RUE deficits/detail RUE Deficits / Details: R hand ROM intact RUE: Unable to fully assess due to immobilization    Lower Extremity Assessment Lower Extremity Assessment: RLE deficits/detail RLE Deficits / Details: ROM WFL; MMT not attempted 2* pain RLE: Unable to fully assess due to pain    Cervical / Trunk Assessment Cervical / Trunk Assessment: Normal  Communication   Communication: No difficulties  Cognition Arousal/Alertness: Awake/alert Behavior During Therapy: WFL for tasks assessed/performed Overall Cognitive Status: Within Functional Limits for tasks assessed                                        General Comments      Exercises     Assessment/Plan    PT Assessment Patient needs continued PT services  PT Problem List Decreased activity tolerance;Decreased balance;Decreased mobility;Decreased knowledge of use of DME;Pain       PT Treatment Interventions DME instruction;Gait training;Functional mobility training;Therapeutic activities;Therapeutic exercise;Patient/family education    PT Goals (Current goals can be found in the Care Plan section)  Acute Rehab PT Goals Patient Stated Goal: return to independence PT Goal Formulation: With patient Time For Goal Achievement: 07/23/21 Potential to Achieve Goals: Fair    Frequency Min 3X/week   Barriers to discharge Decreased caregiver support      Co-evaluation PT/OT/SLP Co-Evaluation/Treatment: Yes Reason for Co-Treatment: To address functional/ADL transfers PT goals addressed during session: Mobility/safety with mobility OT goals addressed during session: ADL's and self-care       AM-PAC PT "6 Clicks" Mobility  Outcome Measure Help needed turning from your back to your side while in a flat bed without using bedrails?: A Little Help needed moving from lying on your back to sitting on the side of a flat bed without using  bedrails?: A Little Help needed moving to and from a bed to a chair (including a wheelchair)?: A Lot Help needed standing up from a chair using your arms (e.g., wheelchair or bedside chair)?: A Lot Help needed to walk in hospital room?: Total   6 Click Score: 11    End of Session Equipment Utilized During Treatment: Gait belt Activity Tolerance: Patient limited by pain Patient left: in bed;with call bell/phone within reach Nurse Communication: Mobility status PT Visit Diagnosis: Difficulty in walking, not elsewhere classified (R26.2);Pain;History of falling (Z91.81) Pain - Right/Left: Right    Time: 1010-1032 PT Time Calculation (min) (ACUTE ONLY): 22 min   Charges:   PT Evaluation $PT Eval Low Complexity: 1 Low          [place] Pager [phone] Office [phone]   Iyanna Drummer 07/09/2021, 1:03 PM

## 2021-07-09 NOTE — Progress Notes (Signed)
PROGRESS NOTE    CERES AILLS   [id number]  DOB: 1941-02-05  PCP: Josetta Huddle, MD    DOA: 07/08/2021 LOS: 0   Assessment & Plan   Principal Problem:   Fracture of right inferior pubic ramus, closed, initial encounter [facility]) Active Problems:   Intractable pain   Dry eye syndrome   Fx humeral neck, right, closed, initial encounter   Hypothyroidism (acquired)   Ambulatory dysfunction   Mechanical Fall resulting in Right Humeral Neck fracture and Right inferior pubic ramus fracture Pain control - scheduled Tylenol, PRN Tramadol or OxyIR, IV morphine PRN breakthrough Orthopedic surgery consulted, recs pending Anticipate nonoperative conservative management. PT/OT evaluations, expect to need SNF   Hypothyroidism - continue Synthroid  Ambulatory dysfunction - due to fall and multiple fractures as above.  PT/OT evaluations.  Dry eye syndrome - continue home drops  Hx of Microscopic Colitis - continue budesonide, currently on 90 mg daily through 7/30, then 60 mg daily.  Follows with GI.   Patient BMI: Body mass index is 31.89 kg/m.   DVT prophylaxis: enoxaparin (LOVENOX) injection 40 mg Start: 07/09/21 1000 SCDs Start: 07/08/21 2246   Diet:  Diet Orders (From admission, onward)     Start     Ordered   07/08/21 2246  Diet regular Room service appropriate? Yes; Fluid consistency: Thin  Diet effective now       Question Answer Comment  Room service appropriate? Yes   Fluid consistency: Thin      07/08/21 2250              Code Status: Full Code   Brief Narrative / Hospital Course to Date:   80 y.o. female with medical history significant for hypothyroidism who presents to the emergency department on 07/08/2021 with right pelvis/hip and shoulder pain. Onset of patient's symptoms was 07/07/2021 after falling while playing pickle ball.  She was found to have a Comminuted and impacted right humeral neck fracture, and a minimally displaced fracture is seen  involving the right inferior pubic ramus with small adjacent hematoma  Admitted to [facility] service with orthopedic surgery consulted.  Anticipate non-operative management, but ortho recommendations pending.    Subjective 07/09/21    Pt seen in ED this AM, holding for a bed.  Reports pain controlled as long as she is still.  Shoulder pain has started to flare up again.  Pain medicine helpful but reports it makes her groggy.  No other acute complaints aside from pain in shoulder and hip/groin with any movement of her legs.   Disposition Plan & Communication   Status is: Observation  The patient remains OBS appropriate and will d/c before 2 midnights.    Dispo: The patient is from: Home              Anticipated d/c is to: SNF              Patient currently is not medically stable to d/c.   Difficult to place patient No    Consults, Procedures, Significant Events   Consultants:  Orthopedic surgery  Procedures:  None  Antimicrobials:  Anti-infectives (From admission, onward)    None         Micro    Objective   Vitals:   07/09/21 1200 07/09/21 1300 07/09/21 1330 07/09/21 1600  BP: (!) 103/58 (!) 123/59 (!) 123/59 105/64  Pulse: 70 75 75 76  Resp: '16  16 16  '$ Temp:      TempSrc:  SpO2: 94% 97% 97% 99%  Weight:      Height:       No intake or output data in the 24 hours ending 07/09/21 1706 Filed Weights   07/08/21 2124  Weight: 81.6 kg    Physical Exam:  General exam: awake, alert, no acute distress HEENT: atraumatic, clear conjunctiva, anicteric sclera, moist mucus membranes, hearing grossly normal  Respiratory system: CTAB, no wheezes, rales or rhonchi, normal respiratory effort. Cardiovascular system: normal S1/S2, RRR, no pedal edema.   Gastrointestinal system: soft, NT, ND, no HSM felt, +bowel sounds. Central nervous system: A&O x4. no gross focal neurologic deficits, normal speech Extremities: R upper extremity held immobilized due to pain, no  edema, normal tone Psychiatry: normal mood, congruent affect, judgement and insight appear normal  Labs   Data Reviewed: I have personally reviewed following labs and imaging studies  CBC: Recent Labs  Lab 07/08/21 2200 07/09/21 0550  WBC 8.4 7.5  HGB 14.8 13.9  HCT 44.7 42.8  MCV 97.6 98.8  PLT 200 [id number]   Basic Metabolic Panel: Recent Labs  Lab 07/08/21 2200 07/09/21 0550  NA 137 136  K 4.0 4.3  CL 104 103  CO2 24 26  GLUCOSE 109* 100*  BUN 14 11  CREATININE 0.98 0.88  CALCIUM 9.2 8.6*   GFR: Estimated Creatinine Clearance: 50.7 mL/min (by C-G formula based on SCr of 0.88 mg/dL). Liver Function Tests: No results for input(s): AST, ALT, ALKPHOS, BILITOT, PROT, ALBUMIN in the last 168 hours. No results for input(s): LIPASE, AMYLASE in the last 168 hours. No results for input(s): AMMONIA in the last 168 hours. Coagulation Profile: No results for input(s): INR, PROTIME in the last 168 hours. Cardiac Enzymes: No results for input(s): CKTOTAL, CKMB, CKMBINDEX, TROPONINI in the last 168 hours. BNP (last 3 results) No results for input(s): PROBNP in the last 8760 hours. HbA1C: No results for input(s): HGBA1C in the last 72 hours. CBG: No results for input(s): GLUCAP in the last 168 hours. Lipid Profile: No results for input(s): CHOL, HDL, LDLCALC, TRIG, CHOLHDL, LDLDIRECT in the last 72 hours. Thyroid Function Tests: No results for input(s): TSH, T4TOTAL, FREET4, T3FREE, THYROIDAB in the last 72 hours. Anemia Panel: No results for input(s): VITAMINB12, FOLATE, FERRITIN, TIBC, IRON, RETICCTPCT in the last 72 hours. Sepsis Labs: No results for input(s): PROCALCITON, LATICACIDVEN in the last 168 hours.  No results found for this or any previous visit (from the past 240 hour(s)).    Imaging Studies   No results found.   Medications   Scheduled Meds:  acetaminophen  1,000 mg Oral Q8H   Or   acetaminophen  650 mg Rectal Q8H   B-complex with vitamin C  1 tablet  Oral Daily   budesonide  9 mg Oral Daily   calcium carbonate  1 tablet Oral Q breakfast   cholecalciferol  2,000 Units Oral Daily   colestipol  2 g Oral QHS   cycloSPORINE  1 drop Both Eyes BID   enoxaparin (LOVENOX) injection  40 mg Subcutaneous Q24H   [START ON 07/10/2021] levothyroxine  25 mcg Oral QODAY   levothyroxine  50 mcg Oral QODAY   multivitamin with minerals  1 tablet Oral Daily   sertraline  25 mg Oral Daily   Continuous Infusions:     LOS: 0 days    Time spent: 30 minutes    Ezekiel Slocumb, DO [facility]  07/09/2021, 5:06 PM      If 7PM-7AM, please contact  night-coverage. How to contact the [facility] Attending or Consulting provider [place] or covering provider during after hours [place], for this patient?    Check the care team in [facility] and look for a) attending/consulting [facility] provider listed and b) the [facility] team listed Log into [email or web address] and use [place]'s universal password to access. If you do not have the password, please contact the hospital operator. Locate the [facility] provider you are looking for under [facility] and page to a number that you can be directly reached. If you still have difficulty reaching the provider, please page the [facility] (Director on Call) for the Hospitalists listed on amion for assistance.

## 2021-07-10 ENCOUNTER — Observation Stay (HOSPITAL_COMMUNITY): Payer: PPO

## 2021-07-10 DIAGNOSIS — S32591A Other specified fracture of right pubis, initial encounter for closed fracture: Secondary | ICD-10-CM | POA: Diagnosis not present

## 2021-07-10 DIAGNOSIS — M79672 Pain in left foot: Secondary | ICD-10-CM | POA: Diagnosis not present

## 2021-07-10 NOTE — Consult Note (Signed)
Reason for Consult: Shoulder and hip pain Referring Physician: Dr. [place]  Kylie Christensen is an 80 y.o. female.  HPI: Kylie Christensen is an 80 year old patient seen yesterday with shoulder and hip pain.  She had a fall at home 2 days ago.  Was seen at [facility] urgent care and tried to make it at home but could not do so because of the pain..  She is a Hydrographic surveyor.  CT scan shows nondisplaced right pubic rami fracture.  Plain x-rays of the shoulder show impacted humeral neck fracture with no dislocation. Past Medical History:  Diagnosis Date   Anxiety    Colitis    GERD (gastroesophageal reflux disease)    Headache    Hypothyroidism    Microscopic colitis     Past Surgical History:  Procedure Laterality Date   ABDOMINAL HYSTERECTOMY     partial   BACK SURGERY  nov 2011   lower back   BALLOON DILATION N/A 01/25/2015   Procedure: BALLOON DILATION;  Surgeon: Garlan Fair, MD;  Location: [facility];  Service: Endoscopy;  Laterality: N/A;   BIOPSY BREAST Left yrs ago   benign   COLONOSCOPY WITH PROPOFOL N/A 01/25/2015   Procedure: COLONOSCOPY WITH PROPOFOL;  Surgeon: Garlan Fair, MD;  Location: [facility];  Service: Endoscopy;  Laterality: N/A;   ESOPHAGOGASTRODUODENOSCOPY (EGD) WITH PROPOFOL N/A 01/25/2015   Procedure: ESOPHAGOGASTRODUODENOSCOPY (EGD) WITH PROPOFOL;  Surgeon: Garlan Fair, MD;  Location: [facility];  Service: Endoscopy;  Laterality: N/A;   KNEE SURGERY     OPEN REDUCTION INTERNAL FIXATION (ORIF) DISTAL RADIAL FRACTURE Left 01/21/2016   Procedure: OPEN REDUCTION INTERNAL FIXATION (ORIF) DISTAL RADIAL FRACTURE;  Surgeon: Iran Planas, MD;  Location: [place];  Service: Orthopedics;  Laterality: Left;   rotator cuffr Right    SHOULDER ARTHROSCOPY     thyroid nodule removed  1970   TONSILLECTOMY  age 14   WRIST FRACTURE SURGERY      Family History  Problem Relation Age of Onset   Diabetes Mother    Other Mother        Zollinger-Ellison syndrome    Heart attack Father 52       Died of MI at 77 yo.   Goiter Paternal Grandmother     Social History:  reports that she has never smoked. She has never used smokeless tobacco. She reports current alcohol use. She reports that she does not use drugs.  Allergies:  Allergies  Allergen Reactions   Amoxicillin Diarrhea and Rash    Medications: I have reviewed the patient's current medications.  Results for orders placed or performed during the hospital encounter of 07/08/21 (from the past 48 hour(s))  CBC     Status: None   Collection Time: 07/08/21 10:00 PM  Result Value Ref Range   WBC 8.4 4.0 - 10.5 K/uL   RBC 4.58 3.87 - 5.11 MIL/uL   Hemoglobin 14.8 12.0 - 15.0 g/dL   HCT 44.7 36.0 - 46.0 %   MCV 97.6 80.0 - 100.0 fL   MCH 32.3 26.0 - 34.0 pg   MCHC 33.1 30.0 - 36.0 g/dL   RDW 14.6 11.5 - 15.5 %   Platelets 200 150 - 400 K/uL   nRBC 0.0 0.0 - 0.2 %    Comment: Performed at [facility], [place] [address], [place], [place] [id number]  Basic metabolic panel     Status: Abnormal   Collection Time: 07/08/21 10:00 PM  Result Value Ref Range  Sodium 137 135 - 145 mmol/L   Potassium 4.0 3.5 - 5.1 mmol/L   Chloride 104 98 - 111 mmol/L   CO2 24 22 - 32 mmol/L   Glucose, Bld 109 (H) 70 - 99 mg/dL    Comment: Glucose reference range applies only to samples taken after fasting for at least 8 hours.   BUN 14 8 - 23 mg/dL   Creatinine, Ser 0.98 0.44 - 1.00 mg/dL   Calcium 9.2 8.9 - 10.3 mg/dL   GFR, Estimated 58 (L) >60 mL/min    Comment: (NOTE) Calculated using the CKD-EPI Creatinine Equation (2021)    Anion gap 9 5 - 15    Comment: Performed at [facility], [place] [address], [place], [place] [address]  SARS CORONAVIRUS 2 (TAT 6-24 HRS) Nasopharyngeal Nasopharyngeal Swab     Status: None   Collection Time: 07/08/21 10:00 PM   Specimen: Nasopharyngeal Swab  Result Value Ref Range   SARS Coronavirus 2 NEGATIVE NEGATIVE    Comment:  (NOTE) SARS-CoV-2 target nucleic acids are NOT DETECTED.  The SARS-CoV-2 RNA is generally detectable in upper and lower respiratory specimens during the acute phase of infection. Negative results do not preclude SARS-CoV-2 infection, do not rule out co-infections with other pathogens, and should not be used as the sole basis for treatment or other patient management decisions. Negative results must be combined with clinical observations, patient history, and epidemiological information. The expected result is Negative.  Fact Sheet for Patients: SugarRoll.be  Fact Sheet for Healthcare Providers: https://www.woods-mathews.com/  This test is not yet approved or cleared by the [place] FDA and  has been authorized for detection and/or diagnosis of SARS-CoV-2 by FDA under an Emergency Use Authorization (EUA). This EUA will remain  in effect (meaning this test can be used) for the duration of the COVID-19 declaration under Se ction 564(b)(1) of the Act, 21 U.S.C. section 360bbb-3(b)(1), unless the authorization is terminated or revoked sooner.  Performed at [facility], [place] [address] [place], [place] [id number]   Basic metabolic panel     Status: Abnormal   Collection Time: 07/09/21  5:50 AM  Result Value Ref Range   Sodium 136 135 - 145 mmol/L   Potassium 4.3 3.5 - 5.1 mmol/L   Chloride 103 98 - 111 mmol/L   CO2 26 22 - 32 mmol/L   Glucose, Bld 100 (H) 70 - 99 mg/dL    Comment: Glucose reference range applies only to samples taken after fasting for at least 8 hours.   BUN 11 8 - 23 mg/dL   Creatinine, Ser 0.88 0.44 - 1.00 mg/dL   Calcium 8.6 (L) 8.9 - 10.3 mg/dL   GFR, Estimated >60 >60 mL/min    Comment: (NOTE) Calculated using the CKD-EPI Creatinine Equation (2021)    Anion gap 7 5 - 15    Comment: Performed at [facility], [place] [address], [place],  [address]  CBC     Status: None   Collection  Time: 07/09/21  5:50 AM  Result Value Ref Range   WBC 7.5 4.0 - 10.5 K/uL   RBC 4.33 3.87 - 5.11 MIL/uL   Hemoglobin 13.9 12.0 - 15.0 g/dL   HCT 42.8 36.0 - 46.0 %   MCV 98.8 80.0 - 100.0 fL   MCH 32.1 26.0 - 34.0 pg   MCHC 32.5 30.0 - 36.0 g/dL   RDW 14.6 11.5 - 15.5 %   Platelets 187 150 - 400 K/uL  nRBC 0.0 0.0 - 0.2 %    Comment: Performed at [facility], [place] [address] [place], [place] [address]    No results found.  Review of Systems  Musculoskeletal:  Positive for arthralgias.  All other systems reviewed and are negative. Blood pressure 123/64, pulse 71, temperature 98 F (36.7 C), temperature source Oral, resp. rate 16, height '5\' 3"'$  (1.6 m), weight 79.3 kg, SpO2 98 %. Physical Exam Vitals reviewed.  HENT:     Head: Normocephalic.     Nose: Nose normal.     Mouth/Throat:     Mouth: Mucous membranes are moist.  Cardiovascular:     Rate and Rhythm: Normal rate.     Pulses: Normal pulses.  Pulmonary:     Effort: Pulmonary effort is normal.  Abdominal:     General: Abdomen is flat.  Musculoskeletal:     Cervical back: Normal range of motion.  Skin:    General: Skin is warm.     Capillary Refill: Capillary refill takes less than 2 seconds.  Neurological:     General: No focal deficit present.     Mental Status: She is alert.  Psychiatric:        Mood and Affect: Mood normal.  Examination of the right arm demonstrates swelling but intact radial pulse on the right.  EPL FPL interosseous function intact.  Left upper extremity has good range of motion shoulder elbow and wrist with good hand function.  Left lower extremity demonstrates good range of motion of the knee ankle and hip.  On the right-hand side there is some pain with internal ex rotation of the right leg.  Ankle dorsiflexion intact.  Bilateral palpable pedal pulses intact.  Assessment/Plan: Impression is right shoulder proximal humerus fracture which should heal with sling  immobilization for 3 weeks then initiation of pendulum exercises.  Nonweightbearing through the right arm during that time.  Pubic rami fracture nondisplaced.  Anticipate improvement of about 50% over the next 10 days.  Okay to be weightbearing as tolerated on that right leg is much as pain will allow.  Follow-up with [place] in 2 weeks for repeat clinical assessment and radiographs on the shoulder.  All this is explained to the patient yesterday in detail.  G Scott Valor [place] 07/10/2021, 7:00 AM

## 2021-07-10 NOTE — NC FL2 (Addendum)
[place] LEVEL OF CARE SCREENING TOOL     IDENTIFICATION  Patient Name: Kylie Christensen Birthdate: [birth year] Sex: female Admission Date (Current Location): 07/08/2021  [facility] and [place] Number:  Herbalist and Address:  [facility],  [place], [place]      Provider Number: [id number]  Attending Physician Name and Address:  [place] Eric J, DO  Relative Name and Phone Number:  Raisa Aurelio (spouse) Ph: [phone]    Current Level of Care: Hospital Recommended Level of Care: [place] Prior Approval Number:    Date Approved/Denied:   PASRR Number: [id number]  Discharge Plan: SNF    Current Diagnoses: Patient Active Problem List   Diagnosis Date Noted   Intractable pain 07/08/2021   Fracture of right inferior pubic ramus, closed, initial encounter ([place]) 07/08/2021   Fx humeral neck, right, closed, initial encounter 07/08/2021   Hypothyroidism (acquired) 07/08/2021   Ambulatory dysfunction 07/08/2021   Closed displaced comminuted fracture of right patella 03/31/2018   Distal radius fracture, left 01/21/2016   Dry eye syndrome 02/16/2013   Ischemic optic neuropathy of left eye 02/16/2013    Orientation RESPIRATION BLADDER Height & Weight     Self, Time, Situation, Place  Normal Continent Weight: 178 lb 9.2 oz (81 kg) Height:  '5\' 3"'$  (160 cm)  BEHAVIORAL SYMPTOMS/MOOD NEUROLOGICAL BOWEL NUTRITION STATUS      Continent Diet (Regular diet)  AMBULATORY STATUS COMMUNICATION OF NEEDS Skin   Limited Assist Verbally Other (Comment) (Ecchymosis: right arm, hip)                       Personal Care Assistance Level of Assistance  Bathing, Feeding, Dressing Bathing Assistance: Limited assistance Feeding assistance: Independent Dressing Assistance: Limited assistance     Functional Limitations Info  Sight, Hearing, Speech Sight Info: Adequate Hearing Info: Adequate Speech Info: Adequate     SPECIAL CARE FACTORS FREQUENCY  PT (By licensed PT), OT (By licensed OT)     PT Frequency: 5x's/week OT Frequency: 5x's/week            Contractures Contractures Info: Not present    Additional Factors Info  Code Status, Allergies, Psychotropic Code Status Info: Full Allergies Info: Amoxicillin Psychotropic Info: Zoloft         Current Medications (07/11/2021):  This is the current hospital active medication list Current Facility-Administered Medications  Medication Dose Route Frequency Provider Last Rate Last Admin   acetaminophen (TYLENOL) tablet 1,000 mg  1,000 mg Oral Q8H Griffith, Kelly A, DO   1,000 mg at 07/11/21 [id number]   Or   acetaminophen (TYLENOL) suppository 650 mg  650 mg Rectal Q8H Nicole Kindred A, DO       B-complex with vitamin C tablet 1 tablet  1 tablet Oral Daily Tacey Ruiz, MD   1 tablet at 07/11/21 1033   bisacodyl (DULCOLAX) EC tablet 5 mg  5 mg Oral Daily PRN Tacey Ruiz, MD       budesonide (ENTOCORT EC) 24 hr capsule 9 mg  9 mg Oral Daily Nicole Kindred A, DO   9 mg at 07/11/21 1033   calcium carbonate (OS-CAL - dosed in mg of elemental calcium) tablet 500 mg of elemental calcium  1 tablet Oral Q breakfast Tacey Ruiz, MD   500 mg of elemental calcium at 07/11/21 1033   cholecalciferol (VITAMIN D3) tablet 2,000 Units  2,000 Units Oral Daily Tacey Ruiz, MD   2,000 Units  at 07/11/21 1033   colestipol (COLESTID) tablet 2 g  2 g Oral QHS Tacey Ruiz, MD   2 g at 07/10/21 2344   cycloSPORINE (RESTASIS) 0.05 % ophthalmic emulsion 1 drop  1 drop Both Eyes BID Tacey Ruiz, MD       enoxaparin (LOVENOX) injection 40 mg  40 mg Subcutaneous Q24H Tacey Ruiz, MD   40 mg at 07/11/21 1033   levothyroxine (SYNTHROID) tablet 25 mcg  25 mcg Oral Eliezer Lofts, MD   25 mcg at 07/10/21 [id number]   levothyroxine (SYNTHROID) tablet 50 mcg  50 mcg Oral Eliezer Lofts, MD   50 mcg at 07/11/21 [id number]   morphine 2 MG/ML injection 2 mg   2 mg Intravenous Q2H PRN Nicole Kindred A, DO   2 mg at 07/09/21 2114   multivitamin with minerals tablet 1 tablet  1 tablet Oral Daily Tacey Ruiz, MD   1 tablet at 07/11/21 1033   ondansetron (ZOFRAN) tablet 4 mg  4 mg Oral Q6H PRN Tacey Ruiz, MD       Or   ondansetron [facility]) injection 4 mg  4 mg Intravenous Q6H PRN Tacey Ruiz, MD       oxyCODONE (Oxy IR/ROXICODONE) immediate release tablet 10 mg  10 mg Oral Q6H PRN [place] Eric J, DO       polyvinyl alcohol (LIQUIFILM TEARS) 1.4 % ophthalmic solution 1 drop  1 drop Both Eyes PRN Nicole Kindred A, DO       senna-docusate (Senokot-S) tablet 1 tablet  1 tablet Oral QHS PRN Tacey Ruiz, MD       sertraline (ZOLOFT) tablet 25 mg  25 mg Oral Daily Nicole Kindred A, DO   25 mg at 07/11/21 1033   traMADol (ULTRAM) tablet 50 mg  50 mg Oral Q6H PRN Nicole Kindred A, DO   50 mg at 07/11/21 1033     Discharge Medications: Please see discharge summary for a list of discharge medications.  Relevant Imaging Results:  Relevant Lab Results:   Additional Information SSN: [id number]  Lennart Pall, LCSW

## 2021-07-10 NOTE — Progress Notes (Addendum)
PROGRESS NOTE    Kylie Christensen  [id number] DOB: Jan 03, 1941 DOA: 07/08/2021 PCP: Josetta Huddle, MD    Brief Narrative:  Kylie Christensen is a 80 year old female with past medical history significant for hypothyroidism who presented to [facility] ED on 7/23 with right pelvis/hip pain and right shoulder pain.  Patient reports fall while playing pickle ball on 7/22.  In the ED, patient was found to have a comminuted impacted right humeral neck fracture with minimally displaced fracture involving the right inferior pubic ramus with small adjacent hematoma.  Orthopedics was consulted.  TRH consulted for further evaluation and management.   Assessment & Plan:   Principal Problem:   Fracture of right inferior pubic ramus, closed, initial encounter ([place]) Active Problems:   Intractable pain   Dry eye syndrome   Fx humeral neck, right, closed, initial encounter   Hypothyroidism (acquired)   Ambulatory dysfunction   Right humeral neck fracture Right inferior pubic ramus fracture Patient presenting to the ED for fall mechanical fall while playing pickle ball.  Patient was noted to have a comminuted, impacted right humeral neck fracture and minimally displaced fracture involving the right inferior pubic ramus.  Orthopedics was consulted, seen by Dr. Marlou Sa who recommended nonoperative management with immobilization of right shoulder with sling x3 weeks and weightbearing as tolerates to right lower extremity. --Sling to RUE, non-weight bearing x 3wks then initiation of pendulum exercises --Tramadol 50 mg p.o. every 6 hours as needed moderate pain --Oxycodone 5 mg p.o. every 6 hours severe pain --Morphine 2 mg IV every 2 hours as needed breakthrough pain --Outpatient follow-up with orthopedics, Dr. Marlou Sa 2 weeks for repeat assessment and x-rays right shoulder  Hypothyroidism --Continue levothyroxine   Ambulatory dysfunction Due to fall and multiple fractures as above. --Continue PT/OT while  inpatient, pending SNF placement  Dry eye syndrome --Continue home eyedrops  History of microscopic colitis Follows with gastroenterology outpatient. --Continues on budesonide 80 mg p.o. daily through 7/30, then 60 mg p.o. daily.  Obesity Body mass index is 30.97 kg/m.  Discussed with patient needs for aggressive lifestyle changes/weight loss as this complicates all facets of care.  Outpatient follow-up with PCP.   DVT prophylaxis: enoxaparin (LOVENOX) injection 40 mg Start: 07/09/21 1000 SCDs Start: 07/08/21 2246   Code Status: Full Code Family Communication: No family present at bedside this morning  Disposition Plan:  Level of care: Med-Surg Status is: Observation  The patient remains OBS appropriate and will d/c before 2 midnights.  Dispo: The patient is from: Home              Anticipated d/c is to: SNF              Patient currently is medically stable to d/c.   Difficult to place patient No  Consultants:  Orthopedics, Dr. Marlou Sa  Procedures:  No  Antimicrobials:  None   Subjective: Patient seen examined bedside, resting comfortably.  Continues with mild pain.  Right arm in sling.  Seen by orthopedics this morning with recommendations on nonoperative management.  Pending SNF placement.  No other questions or concerns at this time.  Denies headache, no fever/chills/night sweats, no nausea/vomiting/diarrhea, no chest pain, no palpitations, no shortness of breath, no abdominal pain, no weakness, no fatigue, no paresthesias.  No acute events overnight per nursing staff.  Objective: Vitals:   07/09/21 2100 07/10/21 0127 07/10/21 0526 07/10/21 1349  BP:  122/64 123/64 122/62  Pulse:  67 71 74  Resp:  18 16 18  Temp:  98.1 F (36.7 C) 98 F (36.7 C) (!) 97.3 F (36.3 C)  TempSrc:  Oral Oral Oral  SpO2:  99% 98% 96%  Weight: 79.3 kg     Height: '5\' 3"'$  (1.6 m)       Intake/Output Summary (Last 24 hours) at 07/10/2021 1443 Last data filed at 07/10/2021 1058 Gross  per 24 hour  Intake 240 ml  Output --  Net 240 ml   Filed Weights   07/08/21 2124 07/09/21 2100  Weight: 81.6 kg 79.3 kg    Examination:  General exam: Appears calm and comfortable  Respiratory system: Clear to auscultation. Respiratory effort normal.  On room air Cardiovascular system: S1 & S2 heard, RRR. No JVD, murmurs, rubs, gallops or clicks. No pedal edema. Gastrointestinal system: Abdomen is nondistended, soft and nontender. No organomegaly or masses felt. Normal bowel sounds heard. Central nervous system: Alert and oriented. No focal neurological deficits. Extremities: Right arm/shoulder in sling, otherwise moves all extremities independently, neurovascular intact Skin: No rashes, lesions or ulcers Psychiatry: Judgement and insight appear normal. Mood & affect appropriate.     Data Reviewed: I have personally reviewed following labs and imaging studies  CBC: Recent Labs  Lab 07/08/21 2200 07/09/21 0550  WBC 8.4 7.5  HGB 14.8 13.9  HCT 44.7 42.8  MCV 97.6 98.8  PLT 200 [id number]   Basic Metabolic Panel: Recent Labs  Lab 07/08/21 2200 07/09/21 0550  NA 137 136  K 4.0 4.3  CL 104 103  CO2 24 26  GLUCOSE 109* 100*  BUN 14 11  CREATININE 0.98 0.88  CALCIUM 9.2 8.6*   GFR: Estimated Creatinine Clearance: 50 mL/min (by C-G formula based on SCr of 0.88 mg/dL). Liver Function Tests: No results for input(s): AST, ALT, ALKPHOS, BILITOT, PROT, ALBUMIN in the last 168 hours. No results for input(s): LIPASE, AMYLASE in the last 168 hours. No results for input(s): AMMONIA in the last 168 hours. Coagulation Profile: No results for input(s): INR, PROTIME in the last 168 hours. Cardiac Enzymes: No results for input(s): CKTOTAL, CKMB, CKMBINDEX, TROPONINI in the last 168 hours. BNP (last 3 results) No results for input(s): PROBNP in the last 8760 hours. HbA1C: No results for input(s): HGBA1C in the last 72 hours. CBG: No results for input(s): GLUCAP in the last 168  hours. Lipid Profile: No results for input(s): CHOL, HDL, LDLCALC, TRIG, CHOLHDL, LDLDIRECT in the last 72 hours. Thyroid Function Tests: No results for input(s): TSH, T4TOTAL, FREET4, T3FREE, THYROIDAB in the last 72 hours. Anemia Panel: No results for input(s): VITAMINB12, FOLATE, FERRITIN, TIBC, IRON, RETICCTPCT in the last 72 hours. Sepsis Labs: No results for input(s): PROCALCITON, LATICACIDVEN in the last 168 hours.  Recent Results (from the past 240 hour(s))  SARS CORONAVIRUS 2 (TAT 6-24 HRS) Nasopharyngeal Nasopharyngeal Swab     Status: None   Collection Time: 07/08/21 10:00 PM   Specimen: Nasopharyngeal Swab  Result Value Ref Range Status   SARS Coronavirus 2 NEGATIVE NEGATIVE Final    Comment: (NOTE) SARS-CoV-2 target nucleic acids are NOT DETECTED.  The SARS-CoV-2 RNA is generally detectable in upper and lower respiratory specimens during the acute phase of infection. Negative results do not preclude SARS-CoV-2 infection, do not rule out co-infections with other pathogens, and should not be used as the sole basis for treatment or other patient management decisions. Negative results must be combined with clinical observations, patient history, and epidemiological information. The expected result is Negative.  Fact Sheet for Patients: SugarRoll.be  Fact Sheet for Healthcare Providers: https://www.woods-mathews.com/  This test is not yet approved or cleared by the [place] FDA and  has been authorized for detection and/or diagnosis of SARS-CoV-2 by FDA under an Emergency Use Authorization (EUA). This EUA will remain  in effect (meaning this test can be used) for the duration of the COVID-19 declaration under Se ction 564(b)(1) of the Act, 21 U.S.C. section 360bbb-3(b)(1), unless the authorization is terminated or revoked sooner.  Performed at [facility], [place] [address] [place], [place] [address]           Radiology Studies: DG Foot Complete Left  Result Date: 07/10/2021 CLINICAL DATA:  Left foot pain EXAM: LEFT FOOT - COMPLETE 3+ VIEW COMPARISON:  None. FINDINGS: Old healed distal 5th metatarsal fracture. No acute bony abnormality. Specifically, no fracture, subluxation, or dislocation. IMPRESSION: No acute bony abnormality. Electronically Signed   By: Rolm Baptise M.D.   On: 07/10/2021 10:08        Scheduled Meds:  acetaminophen  1,000 mg Oral Q8H   Or   acetaminophen  650 mg Rectal Q8H   B-complex with vitamin C  1 tablet Oral Daily   budesonide  9 mg Oral Daily   calcium carbonate  1 tablet Oral Q breakfast   cholecalciferol  2,000 Units Oral Daily   colestipol  2 g Oral QHS   cycloSPORINE  1 drop Both Eyes BID   enoxaparin (LOVENOX) injection  40 mg Subcutaneous Q24H   levothyroxine  25 mcg Oral QODAY   levothyroxine  50 mcg Oral QODAY   multivitamin with minerals  1 tablet Oral Daily   sertraline  25 mg Oral Daily   Continuous Infusions:   LOS: 0 days    Time spent: 36 minutes spent on chart review, discussion with nursing staff, consultants, updating family and interview/physical exam; more than 50% of that time was spent in counseling and/or coordination of care.    Leonel Mccollum J [place] DO [facility] Available via Epic secure chat 7am-7pm After these hours, please refer to coverage provider listed on [email or web address] 07/10/2021, 2:43 PM

## 2021-07-10 NOTE — Care Management (Signed)
RE: Kylie Christensen Date of Birth: 07/08/1941 Date: 07/10/2021 MUST ID: [id number]   To Whom It May Concern:   Please be advised that the above name patient will require a short-term nursing home stay--anticipated 30 days or less rehabilitation and strengthening. The plan is for return home.

## 2021-07-10 NOTE — TOC Initial Note (Addendum)
Transition of Care [facility]) - Initial/Assessment Note   Patient Details  Name: Kylie Christensen MRN: [id number] Date of Birth: 05-08-1941  Transition of Care [facility]) CM/SW Contact:    Sherie Don, LCSW Phone Number: 07/10/2021, 10:48 AM  Clinical Narrative: PT evaluation recommended SNF. CSW spoke with patient to discuss recommendations. Patient is agreeable to rehab. She resident in independent living at [facility]. Patient is interested in SNF at [facility] if possible.  FL2 done; PASRR pending. Documents have been uploaded to [facility] for review. CSW faxed initial referral and therapy notes to [facility] and [facility] for review.  CSW called [employer] and spoke with Tammy to start insurance authorization for SNF and EMS transport. TOC awaiting PASRR and insurance authorization.  AddendumRosalie Gums received: [id number].  Expected Discharge Plan: [name] Barriers to Discharge: Continued Medical Work up  Patient Goals and CMS Choice Patient states their goals for this hospitalization and ongoing recovery are:: Go to rehab at [facility] Medicare.gov Compare Post Acute Care list provided to:: Patient Choice offered to / list presented to : Patient  Expected Discharge Plan and Services Expected Discharge Plan: [name] In-house Referral: Clinical Social Work Post Acute Care Choice: [name] Living arrangements for the past 2 months: [place]            DME Arranged: N/A DME Agency: NA  Prior Living Arrangements/Services Living arrangements for the past 2 months: [place] Lives with:: Facility Resident Patient language and need for interpreter reviewed:: Yes Do you feel safe going back to the place where you live?: Yes      Need for Family Participation in Patient Care: No (Comment) Care giver support system in place?: Yes (comment) Criminal Activity/Legal  Involvement Pertinent to Current Situation/Hospitalization: No - Comment as needed  Activities of Daily Living Home Assistive Devices/Equipment: None ADL Screening (condition at time of admission) Patient's cognitive ability adequate to safely complete daily activities?: Yes Is the patient deaf or have difficulty hearing?: No Does the patient have difficulty seeing, even when wearing glasses/contacts?: No Does the patient have difficulty concentrating, remembering, or making decisions?: No Patient able to express need for assistance with ADLs?: Yes Does the patient have difficulty dressing or bathing?: Yes Independently performs ADLs?: Yes (appropriate for developmental age) Does the patient have difficulty walking or climbing stairs?: Yes Weakness of Legs: Both Weakness of Arms/Hands: Right  Permission Sought/Granted Permission sought to share information with : Facility Art therapist granted to share information with : Yes, Verbal Permission Granted Permission granted to share info w AGENCY: [facility] (both locations)  Emotional Assessment Appearance:: Appears stated age Attitude/Demeanor/Rapport: Engaged Affect (typically observed): Accepting Orientation: : Oriented to Self, Oriented to Place, Oriented to  Time, Oriented to Situation Alcohol / Substance Use: Not Applicable Psych Involvement: No (comment)  Admission diagnosis:  Intractable pain [R52] Closed fracture of neck of right humerus, initial encounter [S42.211A] Closed fracture of ramus of right pubis, initial encounter [facility]) [S32.591A] Patient Active Problem List   Diagnosis Date Noted   Intractable pain 07/08/2021   Fracture of right inferior pubic ramus, closed, initial encounter ([place]) 07/08/2021   Fx humeral neck, right, closed, initial encounter 07/08/2021   Hypothyroidism (acquired) 07/08/2021   Ambulatory dysfunction 07/08/2021   Closed displaced comminuted fracture of right patella  03/31/2018   Distal radius fracture, left 01/21/2016   Dry eye syndrome 02/16/2013   Ischemic optic neuropathy of  left eye 02/16/2013   PCP:  Josetta Huddle, MD Pharmacy:   [place], [place] [address] Phone: [phone] Fax: [phone]  Readmission Risk Interventions No flowsheet data found.

## 2021-07-11 DIAGNOSIS — S32591A Other specified fracture of right pubis, initial encounter for closed fracture: Secondary | ICD-10-CM | POA: Diagnosis present

## 2021-07-11 DIAGNOSIS — S32591S Other specified fracture of right pubis, sequela: Secondary | ICD-10-CM | POA: Diagnosis not present

## 2021-07-11 DIAGNOSIS — Z8616 Personal history of COVID-19: Secondary | ICD-10-CM | POA: Diagnosis not present

## 2021-07-11 DIAGNOSIS — Z79890 Hormone replacement therapy (postmenopausal): Secondary | ICD-10-CM | POA: Diagnosis not present

## 2021-07-11 DIAGNOSIS — S42211A Unspecified displaced fracture of surgical neck of right humerus, initial encounter for closed fracture: Secondary | ICD-10-CM | POA: Diagnosis present

## 2021-07-11 DIAGNOSIS — Z833 Family history of diabetes mellitus: Secondary | ICD-10-CM | POA: Diagnosis not present

## 2021-07-11 DIAGNOSIS — H04123 Dry eye syndrome of bilateral lacrimal glands: Secondary | ICD-10-CM | POA: Diagnosis not present

## 2021-07-11 DIAGNOSIS — Y9373 Activity, racquet and hand sports: Secondary | ICD-10-CM | POA: Diagnosis not present

## 2021-07-11 DIAGNOSIS — K219 Gastro-esophageal reflux disease without esophagitis: Secondary | ICD-10-CM | POA: Diagnosis present

## 2021-07-11 DIAGNOSIS — Z8249 Family history of ischemic heart disease and other diseases of the circulatory system: Secondary | ICD-10-CM | POA: Diagnosis not present

## 2021-07-11 DIAGNOSIS — Z20822 Contact with and (suspected) exposure to covid-19: Secondary | ICD-10-CM | POA: Diagnosis present

## 2021-07-11 DIAGNOSIS — E038 Other specified hypothyroidism: Secondary | ICD-10-CM | POA: Diagnosis not present

## 2021-07-11 DIAGNOSIS — Z79899 Other long term (current) drug therapy: Secondary | ICD-10-CM | POA: Diagnosis not present

## 2021-07-11 DIAGNOSIS — Z880 Allergy status to penicillin: Secondary | ICD-10-CM | POA: Diagnosis not present

## 2021-07-11 DIAGNOSIS — S42211S Unspecified displaced fracture of surgical neck of right humerus, sequela: Secondary | ICD-10-CM | POA: Diagnosis not present

## 2021-07-11 DIAGNOSIS — W010XXA Fall on same level from slipping, tripping and stumbling without subsequent striking against object, initial encounter: Secondary | ICD-10-CM | POA: Diagnosis present

## 2021-07-11 DIAGNOSIS — Z6831 Body mass index (BMI) 31.0-31.9, adult: Secondary | ICD-10-CM | POA: Diagnosis not present

## 2021-07-11 DIAGNOSIS — H04129 Dry eye syndrome of unspecified lacrimal gland: Secondary | ICD-10-CM | POA: Diagnosis present

## 2021-07-11 DIAGNOSIS — R52 Pain, unspecified: Secondary | ICD-10-CM | POA: Diagnosis present

## 2021-07-11 DIAGNOSIS — E039 Hypothyroidism, unspecified: Secondary | ICD-10-CM | POA: Diagnosis present

## 2021-07-11 DIAGNOSIS — E669 Obesity, unspecified: Secondary | ICD-10-CM | POA: Diagnosis present

## 2021-07-11 LAB — RESP PANEL BY RT-PCR (FLU A&B, COVID) ARPGX2
Influenza A by PCR: NEGATIVE
Influenza B by PCR: NEGATIVE
SARS Coronavirus 2 by RT PCR: NEGATIVE

## 2021-07-11 MED ORDER — OXYCODONE HCL 10 MG PO TABS: 10.0000 mg | ORAL_TABLET | Freq: Four times a day (QID) | ORAL | 0 refills | Status: AC | PRN

## 2021-07-11 MED ORDER — OXYCODONE HCL 5 MG PO TABS
10.0000 mg | ORAL_TABLET | Freq: Four times a day (QID) | ORAL | Status: DC | PRN
Start: 2021-07-11 — End: 2021-07-12
  Administered 2021-07-11 (×2): 10 mg via ORAL
  Filled 2021-07-11 (×2): qty 2

## 2021-07-11 MED ORDER — TRAMADOL HCL 50 MG PO TABS: 50.0000 mg | ORAL_TABLET | Freq: Four times a day (QID) | ORAL | 0 refills | Status: AC | PRN

## 2021-07-11 MED ORDER — POLYETHYLENE GLYCOL 3350 17 G PO PACK: 17.0000 g | PACK | Freq: Every day | ORAL | 0 refills | Status: DC | PRN

## 2021-07-11 MED ORDER — ACETAMINOPHEN 325 MG PO TABS: 650.0000 mg | ORAL_TABLET | ORAL | 2 refills | Status: AC | PRN

## 2021-07-11 NOTE — Progress Notes (Signed)
RN spent 35 minutes trying to call report. Report finally given to [facility] RN at [place] transport.

## 2021-07-11 NOTE — TOC Transition Note (Signed)
Transition of Care [facility]) - CM/SW Discharge Note   Patient Details  Name: Kylie Christensen MRN: [id number] Date of Birth: [birth year]  Transition of Care [facility]) CM/SW Contact:  Lennart Pall, LCSW Phone Number: 07/11/2021, 1:59 PM   Clinical Narrative:    Pt medically cleared for dc today.  SNF bed available and accepted at [place] has authorized both SNF (# [phone]) and PTAR (# [phone]).  [facility] called at 1420. RN to call report to [phone] and ask for "healthcare nurse for room 35".  No further TOC needs.   Final next level of care: Skilled Nursing Facility Barriers to Discharge: Barriers Resolved   Patient Goals and CMS Choice Patient states their goals for this hospitalization and ongoing recovery are:: Go to rehab at [facility] Medicare.gov Compare Post Acute Care list provided to:: Patient Choice offered to / list presented to : Patient  Discharge Placement              Patient chooses bed at: [facility] Patient to be transferred to facility by: [place] Name of family member notified: pt and spouse Patient and family notified of of transfer: 07/11/21  Discharge Plan and Services In-house Referral: Clinical Social Work   Post Acute Care Choice: [place]          DME Arranged: N/A DME Agency: NA                  Social Determinants of Health ([place]) Interventions     Readmission Risk Interventions No flowsheet data found.

## 2021-07-11 NOTE — Discharge Summary (Signed)
Physician Discharge Summary  Kylie Christensen [id number] DOB: 23-Aug-1941 DOA: 07/08/2021  PCP: Josetta Huddle, MD  Admit date: 07/08/2021 Discharge date: 07/11/2021  Admitted From: Home Disposition: SNF    Recommendations for Outpatient Follow-up:  Follow up with PCP in 1-2 weeks Follow-up with orthopedics, Dr. Marlou Sa in 2 weeks   Discharge Condition: Stable CODE STATUS: Full code Diet recommendation: Regular diet  History of present illness:  Kylie Christensen is a 80 year old female with past medical history significant for hypothyroidism who presented to [facility] ED on 7/23 with right pelvis/hip pain and right shoulder pain.  Patient reports fall while playing pickle ball on 7/22.   In the ED, patient was found to have a comminuted impacted right humeral neck fracture with minimally displaced fracture involving the right inferior pubic ramus with small adjacent hematoma.  Orthopedics was consulted.  TRH consulted for further evaluation and management.  Hospital course:  Right humeral neck fracture Right inferior pubic ramus fracture Patient presenting to the ED for fall mechanical fall while playing pickle ball.  Patient was noted to have a comminuted, impacted right humeral neck fracture and minimally displaced fracture involving the right inferior pubic ramus.  Orthopedics was consulted, seen by Dr. Marlou Sa who recommended nonoperative management with immobilization of right shoulder with sling x3 weeks following with initiation of pendulum exercises and weightbearing as tolerates to right lower extremity.  Tramadol 50 mg p.o. every 6 hours as needed moderate pain, Oxycodone 10 mg p.o. every 6 hours severe pain. Outpatient follow-up with orthopedics, Dr. Marlou Sa 2 weeks for repeat assessment and x-rays right shoulder   Hypothyroidism Continue levothyroxine   Ambulatory dysfunction Due to fall and multiple fractures as above.  Discharging to SNF for further rehabilitation   Dry eye  syndrome Continue home eyedrops   History of microscopic colitis Follows with gastroenterology outpatient. Continues on budesonide 9 mg p.o. daily through 7/30, then 6 mg p.o. daily.   Obesity Body mass index is 31.63 kg/m.  Discussed with patient needs for aggressive lifestyle changes/weight loss as this complicates all facets of care.  Outpatient follow-up with PCP.  Discharge Diagnoses:  Principal Problem:   Fracture of right inferior pubic ramus, closed, initial encounter [facility]) Active Problems:   Intractable pain   Dry eye syndrome   Fx humeral neck, right, closed, initial encounter   Hypothyroidism (acquired)   Ambulatory dysfunction    Discharge Instructions  Discharge Instructions     Diet - low sodium heart healthy   Complete by: As directed    Increase activity slowly   Complete by: As directed    No wound care   Complete by: As directed       Allergies as of 07/11/2021       Reactions   Amoxicillin Diarrhea, Rash        Medication List     STOP taking these medications    cyclobenzaprine 5 MG tablet Commonly known as: FLEXERIL   cycloSPORINE 0.05 % ophthalmic emulsion Commonly known as: RESTASIS   diclofenac sodium 1 % Gel Commonly known as: VOLTAREN   gabapentin 100 MG capsule Commonly known as: NEURONTIN   HYDROcodone-acetaminophen 5-325 MG tablet Commonly known as: NORCO/VICODIN   methocarbamol 500 MG tablet Commonly known as: Robaxin   omega-3 acid ethyl esters 1 g capsule Commonly known as: LOVAZA   oxyCODONE-acetaminophen 5-325 MG tablet Commonly known as: Roxicet       TAKE these medications    acetaminophen 325 MG tablet Commonly known as:  Tylenol Take 2 tablets (650 mg total) by mouth every 4 (four) hours as needed for mild pain, fever or headache.   B-complex with vitamin C tablet Take 1 tablet by mouth daily.   budesonide 3 MG 24 hr capsule Commonly known as: ENTOCORT EC Take 9 mg by mouth daily. What changed:  Another medication with the same name was removed. Continue taking this medication, and follow the directions you see here.   calcium carbonate 1250 (500 Ca) MG tablet Commonly known as: OS-CAL - dosed in mg of elemental calcium Take 1 tablet by mouth.   cholecalciferol 1000 units tablet Commonly known as: VITAMIN D Take 1,000 Units by mouth daily.   colestipol 1 g tablet Commonly known as: COLESTID Take 1 g by mouth 2 (two) times daily.   docusate sodium 100 MG capsule Commonly known as: Colace Take 1 capsule (100 mg total) by mouth 2 (two) times daily.   FISH OIL PO Take 1 Dose by mouth daily.   levothyroxine 25 MCG tablet Commonly known as: SYNTHROID Take 25 mcg by mouth daily before breakfast.   multivitamin with minerals Tabs tablet Take 1 tablet by mouth daily.   Oxycodone HCl 10 MG Tabs Take 1 tablet (10 mg total) by mouth every 6 (six) hours as needed for up to 7 days for severe pain.   polyethylene glycol 17 g packet Commonly known as: MiraLax Take 17 g by mouth daily as needed for mild constipation.   PRESERVISION AREDS 2+MULTI VIT PO Take 1 Dose by mouth daily.   PROBIOTIC PO Take 1 Dose by mouth daily.   REFRESH OP Apply 1 drop to eye 3 (three) times daily as needed (Dry eyes).   sertraline 50 MG tablet Commonly known as: ZOLOFT Take 25 mg by mouth daily.   traMADol 50 MG tablet Commonly known as: ULTRAM Take 1 tablet (50 mg total) by mouth every 6 (six) hours as needed for up to 7 days for moderate pain.   vitamin C 500 MG tablet Commonly known as: ASCORBIC ACID Take 1 tablet (500 mg total) by mouth daily.   ZINC PO Take 1 tablet by mouth daily.        Follow-up Information     Marlou Sa, Tonna Corner, MD. Schedule an appointment as soon as possible for a visit in 2 week(s).   Specialty: Orthopedic Surgery Contact information: [place] [address] [phone]         Josetta Huddle, MD Follow up in 1 week(s).    Specialty: Internal Medicine Contact information: [address] [place] [address] [phone]                Allergies  Allergen Reactions   Amoxicillin Diarrhea and Rash    Consultations: Orthopedics, Dr. Marlou Sa   Procedures/Studies: DG Shoulder Right  Result Date: 07/07/2021 CLINICAL DATA:  Right shoulder pain after fall. EXAM: RIGHT SHOULDER - 2+ VIEW COMPARISON:  None. FINDINGS: Comminuted and impacted fracture is seen involving the right humeral neck. No dislocation is noted. IMPRESSION: Comminuted and impacted right humeral neck fracture. Electronically Signed   By: Marijo Conception M.D.   On: 07/07/2021 15:26   DG Elbow Complete Right  Result Date: 07/07/2021 CLINICAL DATA:  Right elbow pain after fall. EXAM: RIGHT ELBOW - COMPLETE 3+ VIEW COMPARISON:  None. FINDINGS: There is no evidence of fracture, dislocation, or joint effusion. There is no evidence of arthropathy or other focal bone abnormality. Soft tissues are  unremarkable. IMPRESSION: Negative. Electronically Signed   By: Marijo Conception M.D.   On: 07/07/2021 15:24   CT PELVIS WO CONTRAST  Result Date: 07/07/2021 CLINICAL DATA:  Right hip pain after fall. EXAM: CT PELVIS WITHOUT CONTRAST TECHNIQUE: Multidetector CT imaging of the pelvis was performed following the standard protocol without intravenous contrast. COMPARISON:  January 19, 2021. FINDINGS: Urinary Tract:  No abnormality visualized. Bowel:  Unremarkable visualized pelvic bowel loops. Vascular/Lymphatic: No pathologically enlarged lymph nodes. No significant vascular abnormality seen. Reproductive: Status post hysterectomy. No adnexal abnormality is noted. Other: Small hematoma is seen anteriorly and to the right of the urinary bladder in the pelvis. Musculoskeletal: Minimally displaced fracture is seen involving the right superior pubic ramus. Hip joints are unremarkable. IMPRESSION: Minimally displaced fracture is seen involving the  right inferior pubic ramus. There is an adjacent small hematoma which is anterior to the right of the urinary bladder in the pelvis. Electronically Signed   By: Marijo Conception M.D.   On: 07/07/2021 16:25   DG Foot Complete Left  Result Date: 07/10/2021 CLINICAL DATA:  Left foot pain EXAM: LEFT FOOT - COMPLETE 3+ VIEW COMPARISON:  None. FINDINGS: Old healed distal 5th metatarsal fracture. No acute bony abnormality. Specifically, no fracture, subluxation, or dislocation. IMPRESSION: No acute bony abnormality. Electronically Signed   By: Rolm Baptise M.D.   On: 07/10/2021 10:08   MM 3D SCREEN BREAST BILATERAL  Result Date: 06/30/2021 CLINICAL DATA:  Screening. EXAM: DIGITAL SCREENING BILATERAL MAMMOGRAM WITH TOMOSYNTHESIS AND CAD TECHNIQUE: Bilateral screening digital craniocaudal and mediolateral oblique mammograms were obtained. Bilateral screening digital breast tomosynthesis was performed. The images were evaluated with computer-aided detection. COMPARISON:  Previous exam(s). ACR Breast Density Category b: There are scattered areas of fibroglandular density. FINDINGS: There are no findings suspicious for malignancy. IMPRESSION: No mammographic evidence of malignancy. A result letter of this screening mammogram will be mailed directly to the patient. RECOMMENDATION: Screening mammogram in one year. (Code:[id number]-01Y) BI-RADS CATEGORY  1: Negative. Electronically Signed   By: Fidela Salisbury M.D.   On: 06/30/2021 16:45   DG Hip Unilat  With Pelvis 2-3 Views Right  Result Date: 07/07/2021 CLINICAL DATA:  Right hip pain after fall today. EXAM: DG HIP (WITH OR WITHOUT PELVIS) 2-3V RIGHT COMPARISON:  None. FINDINGS: There is no evidence of hip fracture or dislocation. There is no evidence of arthropathy or other focal bone abnormality. IMPRESSION: Negative. Electronically Signed   By: Marijo Conception M.D.   On: 07/07/2021 15:23     Subjective: Patient seen examined at bedside, resting comfortably.   Continues with mild right shoulder pain.  Patient reports able to move her lower extremities more each day with less pain.  Discharging to SNF today.  No other questions or concerns at this time.  Denies headache, no fever/chills/night sweats, no nausea/vomiting/diarrhea, no chest pain, no palpitations, no shortness of breath, no abdominal pain.  No acute events overnight per nursing staff.  Discharge Exam: Vitals:   07/11/21 0502 07/11/21 1308  BP: 122/78 114/66  Pulse: 60 77  Resp: 16 18  Temp: 97.9 F (36.6 C)   SpO2: 98% 99%   Vitals:   07/10/21 2352 07/11/21 0500 07/11/21 0502 07/11/21 1308  BP: 126/71  122/78 114/66  Pulse: 70  60 77  Resp: '16  16 18  '$ Temp: 97.7 F (36.5 C)  97.9 F (36.6 C)   TempSrc: Oral  Oral   SpO2: 100%  98% 99%  Weight:  81  kg    Height:        General: Pt is alert, awake, not in acute distress Cardiovascular: RRR, S1/S2 +, no rubs, no gallops Respiratory: CTA bilaterally, no wheezing, no rhonchi, on room air Abdominal: Soft, NT, ND, bowel sounds + Extremities: Right upper extremity with ecchymosis and sling in place, no edema, no cyanosis    The results of significant diagnostics from this hospitalization (including imaging, microbiology, ancillary and laboratory) are listed below for reference.     Microbiology: Recent Results (from the past 240 hour(s))  SARS CORONAVIRUS 2 (TAT 6-24 HRS) Nasopharyngeal Nasopharyngeal Swab     Status: None   Collection Time: 07/08/21 10:00 PM   Specimen: Nasopharyngeal Swab  Result Value Ref Range Status   SARS Coronavirus 2 NEGATIVE NEGATIVE Final    Comment: (NOTE) SARS-CoV-2 target nucleic acids are NOT DETECTED.  The SARS-CoV-2 RNA is generally detectable in upper and lower respiratory specimens during the acute phase of infection. Negative results do not preclude SARS-CoV-2 infection, do not rule out co-infections with other pathogens, and should not be used as the sole basis for treatment or  other patient management decisions. Negative results must be combined with clinical observations, patient history, and epidemiological information. The expected result is Negative.  Fact Sheet for Patients: SugarRoll.be  Fact Sheet for Healthcare Providers: https://www.woods-mathews.com/  This test is not yet approved or cleared by the [place] FDA and  has been authorized for detection and/or diagnosis of SARS-CoV-2 by FDA under an Emergency Use Authorization (EUA). This EUA will remain  in effect (meaning this test can be used) for the duration of the COVID-19 declaration under Se ction 564(b)(1) of the Act, 21 U.S.C. section 360bbb-3(b)(1), unless the authorization is terminated or revoked sooner.  Performed at [facility], [place] [address], [place], [place] [address]      Labs: BNP (last 3 results) No results for input(s): BNP in the last 8760 hours. Basic Metabolic Panel: Recent Labs  Lab 07/08/21 2200 07/09/21 0550  NA 137 136  K 4.0 4.3  CL 104 103  CO2 24 26  GLUCOSE 109* 100*  BUN 14 11  CREATININE 0.98 0.88  CALCIUM 9.2 8.6*   Liver Function Tests: No results for input(s): AST, ALT, ALKPHOS, BILITOT, PROT, ALBUMIN in the last 168 hours. No results for input(s): LIPASE, AMYLASE in the last 168 hours. No results for input(s): AMMONIA in the last 168 hours. CBC: Recent Labs  Lab 07/08/21 2200 07/09/21 0550  WBC 8.4 7.5  HGB 14.8 13.9  HCT 44.7 42.8  MCV 97.6 98.8  PLT 200 187   Cardiac Enzymes: No results for input(s): CKTOTAL, CKMB, CKMBINDEX, TROPONINI in the last 168 hours. BNP: Invalid input(s): POCBNP CBG: No results for input(s): GLUCAP in the last 168 hours. D-Dimer No results for input(s): DDIMER in the last 72 hours. Hgb A1c No results for input(s): HGBA1C in the last 72 hours. Lipid Profile No results for input(s): CHOL, HDL, LDLCALC, TRIG, CHOLHDL, LDLDIRECT in the last 72  hours. Thyroid function studies No results for input(s): TSH, T4TOTAL, T3FREE, THYROIDAB in the last 72 hours.  Invalid input(s): FREET3 Anemia work up No results for input(s): VITAMINB12, FOLATE, FERRITIN, TIBC, IRON, RETICCTPCT in the last 72 hours. Urinalysis No results found for: COLORURINE, APPEARANCEUR, LABSPEC, [place], GLUCOSEU, [place], [place], KETONESUR, PROTEINUR, UROBILINOGEN, NITRITE, LEUKOCYTESUR Sepsis Labs Invalid input(s): PROCALCITONIN,  WBC,  LACTICIDVEN Microbiology Recent Results (from the past 240 hour(s))  SARS CORONAVIRUS 2 (TAT 6-24  HRS) Nasopharyngeal Nasopharyngeal Swab     Status: None   Collection Time: 07/08/21 10:00 PM   Specimen: Nasopharyngeal Swab  Result Value Ref Range Status   SARS Coronavirus 2 NEGATIVE NEGATIVE Final    Comment: (NOTE) SARS-CoV-2 target nucleic acids are NOT DETECTED.  The SARS-CoV-2 RNA is generally detectable in upper and lower respiratory specimens during the acute phase of infection. Negative results do not preclude SARS-CoV-2 infection, do not rule out co-infections with other pathogens, and should not be used as the sole basis for treatment or other patient management decisions. Negative results must be combined with clinical observations, patient history, and epidemiological information. The expected result is Negative.  Fact Sheet for Patients: SugarRoll.be  Fact Sheet for Healthcare Providers: https://www.woods-mathews.com/  This test is not yet approved or cleared by the [place] FDA and  has been authorized for detection and/or diagnosis of SARS-CoV-2 by FDA under an Emergency Use Authorization (EUA). This EUA will remain  in effect (meaning this test can be used) for the duration of the COVID-19 declaration under Se ction 564(b)(1) of the Act, 21 U.S.C. section 360bbb-3(b)(1), unless the authorization is terminated or revoked sooner.  Performed at [facility], [place] [address] [place], [place] [address]      Time coordinating discharge: Over 30 minutes  SIGNED:   Donnamarie Poag [place] DO  [facility] 07/11/2021, 1:10 PM

## 2021-07-11 NOTE — Progress Notes (Signed)
PROGRESS NOTE    Kylie Christensen  [id number] DOB: 03/12/1941 DOA: 07/08/2021 PCP: Josetta Huddle, MD    Brief Narrative:  Kylie Christensen is a 80 year old female with past medical history significant for hypothyroidism who presented to [facility] ED on 7/23 with right pelvis/hip pain and right shoulder pain.  Patient reports fall while playing pickle ball on 7/22.  In the ED, patient was found to have a comminuted impacted right humeral neck fracture with minimally displaced fracture involving the right inferior pubic ramus with small adjacent hematoma.  Orthopedics was consulted.  TRH consulted for further evaluation and management.   Assessment & Plan:   Principal Problem:   Fracture of right inferior pubic ramus, closed, initial encounter ([place]) Active Problems:   Intractable pain   Dry eye syndrome   Fx humeral neck, right, closed, initial encounter   Hypothyroidism (acquired)   Ambulatory dysfunction   Right humeral neck fracture Right inferior pubic ramus fracture Patient presenting to the ED for fall mechanical fall while playing pickle ball.  Patient was noted to have a comminuted, impacted right humeral neck fracture and minimally displaced fracture involving the right inferior pubic ramus.  Orthopedics was consulted, seen by Dr. Marlou Sa who recommended nonoperative management with immobilization of right shoulder with sling x3 weeks and weightbearing as tolerates to right lower extremity. --Sling to RUE, non-weight bearing x 3wks then initiation of pendulum exercises --Tramadol 50 mg p.o. every 6 hours as needed moderate pain --Oxycodone 10 mg p.o. every 6 hours severe pain --Morphine 2 mg IV every 2 hours as needed breakthrough pain --Outpatient follow-up with orthopedics, Dr. Marlou Sa 2 weeks for repeat assessment and x-rays right shoulder  Hypothyroidism --Continue levothyroxine   Ambulatory dysfunction Due to fall and multiple fractures as above. --Continue PT/OT while  inpatient, pending SNF placement  Dry eye syndrome --Continue home eyedrops  History of microscopic colitis Follows with gastroenterology outpatient. --Continues on budesonide 80 mg p.o. daily through 7/30, then 60 mg p.o. daily.  Obesity Body mass index is 31.63 kg/m.  Discussed with patient needs for aggressive lifestyle changes/weight loss as this complicates all facets of care.  Outpatient follow-up with PCP.   DVT prophylaxis: enoxaparin (LOVENOX) injection 40 mg Start: 07/09/21 1000 SCDs Start: 07/08/21 2246   Code Status: Full Code Family Communication: No family present at bedside this morning  Disposition Plan:  Level of care: Med-Surg Status is: Observation  The patient remains OBS appropriate and will d/c before 2 midnights.  Dispo: The patient is from: Home              Anticipated d/c is to: SNF              Patient currently is medically stable to d/c.   Difficult to place patient No  Consultants:  Orthopedics, Dr. Marlou Sa  Procedures:  No  Antimicrobials:  None   Subjective: Patient seen examined bedside, resting comfortably.  Continues with mild pain; otherwise no other complaints at this time.  Awaiting insurance authorization for SNF. Denies headache, no fever/chills/night sweats, no nausea/vomiting/diarrhea, no chest pain, no palpitations, no shortness of breath, no abdominal pain, no weakness, no fatigue, no paresthesias.  No acute events overnight per nursing staff.  Objective: Vitals:   07/10/21 1349 07/10/21 2352 07/11/21 0500 07/11/21 0502  BP: 122/62 126/71  122/78  Pulse: 74 70  60  Resp: '18 16  16  '$ Temp: (!) 97.3 F (36.3 C) 97.7 F (36.5 C)  97.9 F (36.6 C)  TempSrc: Oral Oral  Oral  SpO2: 96% 100%  98%  Weight:   81 kg   Height:        Intake/Output Summary (Last 24 hours) at 07/11/2021 1234 Last data filed at 07/11/2021 0502 Gross per 24 hour  Intake --  Output 750 ml  Net -750 ml   Filed Weights   07/08/21 2124 07/09/21  2100 07/11/21 0500  Weight: 81.6 kg 79.3 kg 81 kg    Examination:  General exam: Appears calm and comfortable  Respiratory system: Clear to auscultation. Respiratory effort normal.  On room air Cardiovascular system: S1 & S2 heard, RRR. No JVD, murmurs, rubs, gallops or clicks. No pedal edema. Gastrointestinal system: Abdomen is nondistended, soft and nontender. No organomegaly or masses felt. Normal bowel sounds heard. Central nervous system: Alert and oriented. No focal neurological deficits. Extremities: Right arm/shoulder in sling, otherwise moves all extremities independently, neurovascular intact Skin: Bruising noted to right shoulder/upper arm, otherwise no rashes, lesions or ulcers Psychiatry: Judgement and insight appear normal. Mood & affect appropriate.     Data Reviewed: I have personally reviewed following labs and imaging studies  CBC: Recent Labs  Lab 07/08/21 2200 07/09/21 0550  WBC 8.4 7.5  HGB 14.8 13.9  HCT 44.7 42.8  MCV 97.6 98.8  PLT 200 [id number]   Basic Metabolic Panel: Recent Labs  Lab 07/08/21 2200 07/09/21 0550  NA 137 136  K 4.0 4.3  CL 104 103  CO2 24 26  GLUCOSE 109* 100*  BUN 14 11  CREATININE 0.98 0.88  CALCIUM 9.2 8.6*   GFR: Estimated Creatinine Clearance: 50.5 mL/min (by C-G formula based on SCr of 0.88 mg/dL). Liver Function Tests: No results for input(s): AST, ALT, ALKPHOS, BILITOT, PROT, ALBUMIN in the last 168 hours. No results for input(s): LIPASE, AMYLASE in the last 168 hours. No results for input(s): AMMONIA in the last 168 hours. Coagulation Profile: No results for input(s): INR, PROTIME in the last 168 hours. Cardiac Enzymes: No results for input(s): CKTOTAL, CKMB, CKMBINDEX, TROPONINI in the last 168 hours. BNP (last 3 results) No results for input(s): PROBNP in the last 8760 hours. HbA1C: No results for input(s): HGBA1C in the last 72 hours. CBG: No results for input(s): GLUCAP in the last 168 hours. Lipid  Profile: No results for input(s): CHOL, HDL, LDLCALC, TRIG, CHOLHDL, LDLDIRECT in the last 72 hours. Thyroid Function Tests: No results for input(s): TSH, T4TOTAL, FREET4, T3FREE, THYROIDAB in the last 72 hours. Anemia Panel: No results for input(s): VITAMINB12, FOLATE, FERRITIN, TIBC, IRON, RETICCTPCT in the last 72 hours. Sepsis Labs: No results for input(s): PROCALCITON, LATICACIDVEN in the last 168 hours.  Recent Results (from the past 240 hour(s))  SARS CORONAVIRUS 2 (TAT 6-24 HRS) Nasopharyngeal Nasopharyngeal Swab     Status: None   Collection Time: 07/08/21 10:00 PM   Specimen: Nasopharyngeal Swab  Result Value Ref Range Status   SARS Coronavirus 2 NEGATIVE NEGATIVE Final    Comment: (NOTE) SARS-CoV-2 target nucleic acids are NOT DETECTED.  The SARS-CoV-2 RNA is generally detectable in upper and lower respiratory specimens during the acute phase of infection. Negative results do not preclude SARS-CoV-2 infection, do not rule out co-infections with other pathogens, and should not be used as the sole basis for treatment or other patient management decisions. Negative results must be combined with clinical observations, patient history, and epidemiological information. The expected result is Negative.  Fact Sheet for Patients: SugarRoll.be  Fact Sheet for Healthcare Providers: https://www.woods-mathews.com/  This  test is not yet approved or cleared by the [place] and  has been authorized for detection and/or diagnosis of SARS-CoV-2 by FDA under an Emergency Use Authorization (EUA). This EUA will remain  in effect (meaning this test can be used) for the duration of the COVID-19 declaration under Se ction 564(b)(1) of the Act, 21 U.S.C. section 360bbb-3(b)(1), unless the authorization is terminated or revoked sooner.  Performed at [facility], [place] [address] [place], [place] [address]          Radiology  Studies: DG Foot Complete Left  Result Date: 07/10/2021 CLINICAL DATA:  Left foot pain EXAM: LEFT FOOT - COMPLETE 3+ VIEW COMPARISON:  None. FINDINGS: Old healed distal 5th metatarsal fracture. No acute bony abnormality. Specifically, no fracture, subluxation, or dislocation. IMPRESSION: No acute bony abnormality. Electronically Signed   By: Rolm Baptise M.D.   On: 07/10/2021 10:08        Scheduled Meds:  acetaminophen  1,000 mg Oral Q8H   Or   acetaminophen  650 mg Rectal Q8H   B-complex with vitamin C  1 tablet Oral Daily   budesonide  9 mg Oral Daily   calcium carbonate  1 tablet Oral Q breakfast   cholecalciferol  2,000 Units Oral Daily   colestipol  2 g Oral QHS   cycloSPORINE  1 drop Both Eyes BID   enoxaparin (LOVENOX) injection  40 mg Subcutaneous Q24H   levothyroxine  25 mcg Oral QODAY   levothyroxine  50 mcg Oral QODAY   multivitamin with minerals  1 tablet Oral Daily   sertraline  25 mg Oral Daily   Continuous Infusions:   LOS: 0 days    Time spent: 36 minutes spent on chart review, discussion with nursing staff, consultants, updating family and interview/physical exam; more than 50% of that time was spent in counseling and/or coordination of care.    Claramae Rigdon J [place] DO [facility] Available via Epic secure chat 7am-7pm After these hours, please refer to coverage provider listed on [email or web address] 07/11/2021, 12:34 PM

## 2021-07-12 ENCOUNTER — Non-Acute Institutional Stay (SKILLED_NURSING_FACILITY): Payer: PPO | Admitting: Orthopedic Surgery

## 2021-07-12 ENCOUNTER — Encounter: Payer: Self-pay | Admitting: Orthopedic Surgery

## 2021-07-12 DIAGNOSIS — E039 Hypothyroidism, unspecified: Secondary | ICD-10-CM | POA: Diagnosis not present

## 2021-07-12 DIAGNOSIS — M6281 Muscle weakness (generalized): Secondary | ICD-10-CM | POA: Diagnosis not present

## 2021-07-12 DIAGNOSIS — F339 Major depressive disorder, recurrent, unspecified: Secondary | ICD-10-CM

## 2021-07-12 DIAGNOSIS — Z6831 Body mass index (BMI) 31.0-31.9, adult: Secondary | ICD-10-CM | POA: Diagnosis not present

## 2021-07-12 DIAGNOSIS — S42211A Unspecified displaced fracture of surgical neck of right humerus, initial encounter for closed fracture: Secondary | ICD-10-CM | POA: Diagnosis not present

## 2021-07-12 DIAGNOSIS — R4789 Other speech disturbances: Secondary | ICD-10-CM | POA: Diagnosis not present

## 2021-07-12 DIAGNOSIS — H04129 Dry eye syndrome of unspecified lacrimal gland: Secondary | ICD-10-CM | POA: Diagnosis not present

## 2021-07-12 DIAGNOSIS — J309 Allergic rhinitis, unspecified: Secondary | ICD-10-CM | POA: Diagnosis not present

## 2021-07-12 DIAGNOSIS — S329XXA Fracture of unspecified parts of lumbosacral spine and pelvis, initial encounter for closed fracture: Secondary | ICD-10-CM | POA: Diagnosis not present

## 2021-07-12 DIAGNOSIS — R2689 Other abnormalities of gait and mobility: Secondary | ICD-10-CM | POA: Diagnosis not present

## 2021-07-12 DIAGNOSIS — R5381 Other malaise: Secondary | ICD-10-CM | POA: Diagnosis not present

## 2021-07-12 DIAGNOSIS — G2581 Restless legs syndrome: Secondary | ICD-10-CM | POA: Diagnosis not present

## 2021-07-12 DIAGNOSIS — F419 Anxiety disorder, unspecified: Secondary | ICD-10-CM | POA: Diagnosis not present

## 2021-07-12 DIAGNOSIS — H04123 Dry eye syndrome of bilateral lacrimal glands: Secondary | ICD-10-CM | POA: Diagnosis not present

## 2021-07-12 DIAGNOSIS — S72001B Fracture of unspecified part of neck of right femur, initial encounter for open fracture type I or II: Secondary | ICD-10-CM | POA: Diagnosis not present

## 2021-07-12 DIAGNOSIS — S32591A Other specified fracture of right pubis, initial encounter for closed fracture: Secondary | ICD-10-CM | POA: Diagnosis not present

## 2021-07-12 DIAGNOSIS — S42211S Unspecified displaced fracture of surgical neck of right humerus, sequela: Secondary | ICD-10-CM

## 2021-07-12 DIAGNOSIS — Z7401 Bed confinement status: Secondary | ICD-10-CM | POA: Diagnosis not present

## 2021-07-12 DIAGNOSIS — K52831 Collagenous colitis: Secondary | ICD-10-CM | POA: Diagnosis not present

## 2021-07-12 DIAGNOSIS — S32591S Other specified fracture of right pubis, sequela: Secondary | ICD-10-CM | POA: Diagnosis not present

## 2021-07-12 DIAGNOSIS — E038 Other specified hypothyroidism: Secondary | ICD-10-CM

## 2021-07-12 DIAGNOSIS — M25511 Pain in right shoulder: Secondary | ICD-10-CM | POA: Diagnosis not present

## 2021-07-12 DIAGNOSIS — R2681 Unsteadiness on feet: Secondary | ICD-10-CM | POA: Diagnosis not present

## 2021-07-12 DIAGNOSIS — M8589 Other specified disorders of bone density and structure, multiple sites: Secondary | ICD-10-CM

## 2021-07-12 DIAGNOSIS — S32591D Other specified fracture of right pubis, subsequent encounter for fracture with routine healing: Secondary | ICD-10-CM | POA: Diagnosis not present

## 2021-07-12 DIAGNOSIS — G4700 Insomnia, unspecified: Secondary | ICD-10-CM | POA: Diagnosis not present

## 2021-07-12 DIAGNOSIS — S42211D Unspecified displaced fracture of surgical neck of right humerus, subsequent encounter for fracture with routine healing: Secondary | ICD-10-CM | POA: Diagnosis not present

## 2021-07-12 DIAGNOSIS — K219 Gastro-esophageal reflux disease without esophagitis: Secondary | ICD-10-CM | POA: Diagnosis not present

## 2021-07-12 NOTE — Progress Notes (Signed)
Transported off floor Via [facility] to [facility] per prior notes. Medicated for pain, verbalized understanding discharge instructions included with transfer paperwork. Discharged from [place] at this time.

## 2021-07-12 NOTE — Progress Notes (Signed)
Provider:  Windell Moulding, NP Location:   [place] Room Number: N35 Place of Service:  SNF (31)  PCP: Josetta Huddle, MD Patient Care Team: Josetta Huddle, MD as PCP - General (Internal Medicine)  Extended Emergency Contact Information Primary Emergency Contact: Alen Blew A Address: [address]          [place], [place] [address] [place] Phone: [phone] Relation: Spouse Secondary Emergency Contact: Mcneel,Cindy Address: [address]          [place], [place] [address] [place] Phone: [phone] Relation: Daughter  Code Status: DNR Goals of Care: Advanced Directive information Advanced Directives 07/12/2021  Does Patient Have a Medical Advance Directive? Yes  Type of [place] of [place];Living will;Out of facility DNR (pink MOST or yellow form)  Does patient want to make changes to medical advance directive? No - Patient declined  Copy of [place] in Chart? Yes - validated most recent copy scanned in chart (See row information)  Would patient like information on creating a medical advance directive? -  Pre-existing out of facility DNR order (yellow form or pink MOST form) Pink MOST form placed in chart (order not valid for inpatient use)      Chief Complaint  Patient presents with   New Admit To SNF    Patient new admission to SNF    HPI: Patient is a 80 y.o. female seen today for f/u s/p hospitalization at [place] 07/23- 07/27.   07/22 she was out playing pickle ball when her shoe got caught and she fell on her right side. Pain progressed throughout the day, she presented to the ED and was given oral pain medication. 07/23 she returned to the ED for severe right shoulder pain, rated 9/10. She also reported right sided groin pain 9/10. Pelvic x-ray negative for hip fracture or dislocation. CT pelvis revealed minimally displaced fracture of the right  inferior pubic ramus. Right shoulder x-ray revealed comminuted and impacted right humeral neck fracture. She was admitted for pain control. Discharged to SNF for additional PT/OT and nursing services.   Right humeral neck fracture/pubic ramus fracture- ortho consulted and recommended non operative management, immobilization of right shoulder in sling x 3 weeks then pendulum exercises, RLE WBAT, she is allowed to have tramadol for moderate pain and oxycodone for severe pain, f/u needed with Dr. Marlou Sa in 2 weeks. At this time she is 2 person assist with walker, gail shuffling.   Hypothyroidism- TSH unknown, levothyroxine 25 mcg daily Dry eyes- refresh tid Colitis- followed by GI, she reports her last bowel movement was in hospital, currently on budesonide 9 mg daily till 07/30, then 6 mg daily after, colestipol 1 g bid, florastor daily  Obesity- BMI 31.69, lifestyle changes discussed during hospitalization, plan to limit calories Osteopenia- DEXA 05/2020- indicated osteopenia, remains in calcium and vit d supplement daily  Depression- zoloft 50 mg daily  She has not obtained her second covid booster. Reports having covid over memorial weekend in May 2022. Her PCP advised her to wait on second booster due to acquired immunity. She remains on isolation per [facility] covid protocols.   Her goal is to discharge home after recovery.  Nurse does not report any other concerns, vitals stable.        Past Medical History:  Diagnosis Date   Anxiety    Colitis    GERD (gastroesophageal reflux disease)    Headache  Hypothyroidism    Microscopic colitis    Past Surgical History:  Procedure Laterality Date   ABDOMINAL HYSTERECTOMY     partial   BACK SURGERY  nov 2011   lower back   BALLOON DILATION N/A 01/25/2015   Procedure: BALLOON DILATION;  Surgeon: Garlan Fair, MD;  Location: [facility];  Service: Endoscopy;  Laterality: N/A;   BIOPSY BREAST Left yrs ago   benign   COLONOSCOPY  WITH PROPOFOL N/A 01/25/2015   Procedure: COLONOSCOPY WITH PROPOFOL;  Surgeon: Garlan Fair, MD;  Location: [facility];  Service: Endoscopy;  Laterality: N/A;   ESOPHAGOGASTRODUODENOSCOPY (EGD) WITH PROPOFOL N/A 01/25/2015   Procedure: ESOPHAGOGASTRODUODENOSCOPY (EGD) WITH PROPOFOL;  Surgeon: Garlan Fair, MD;  Location: [facility];  Service: Endoscopy;  Laterality: N/A;   KNEE SURGERY     OPEN REDUCTION INTERNAL FIXATION (ORIF) DISTAL RADIAL FRACTURE Left 01/21/2016   Procedure: OPEN REDUCTION INTERNAL FIXATION (ORIF) DISTAL RADIAL FRACTURE;  Surgeon: Iran Planas, MD;  Location: [facility];  Service: Orthopedics;  Laterality: Left;   rotator cuffr Right    SHOULDER ARTHROSCOPY     thyroid nodule removed  1970   TONSILLECTOMY  age 77   WRIST FRACTURE SURGERY      reports that she has never smoked. She has never used smokeless tobacco. She reports current alcohol use. She reports that she does not use drugs. Social History   Socioeconomic History   Marital status: Married    Spouse name: Not on file   Number of children: Not on file   Years of education: Not on file   Highest education level: Not on file  Occupational History   Not on file  Tobacco Use   Smoking status: Never   Smokeless tobacco: Never  Vaping Use   Vaping Use: Never used  Substance and Sexual Activity   Alcohol use: Yes    Comment: occasional   Drug use: Never   Sexual activity: Not on file  Other Topics Concern   Not on file  Social History Narrative   Not on file   Social Determinants of Health   Financial Resource Strain: Not on file  Food Insecurity: Not on file  Transportation Needs: Not on file  Physical Activity: Not on file  Stress: Not on file  Social Connections: Not on file  Intimate Partner Violence: Not on file    Functional Status Survey:    Family History  Problem Relation Age of Onset   Diabetes Mother    Other Mother        Zollinger-Ellison syndrome   Heart attack Father  73       Died of MI at 71 yo.   Goiter Paternal Grandmother     Health Maintenance  Topic Date Due   COVID-19 Vaccine (1) Never done   TETANUS/TDAP  Never done   Zoster Vaccines- Shingrix (1 of 2) Never done   PNA vac Low Risk Adult (1 of 2 - PCV13) Never done   INFLUENZA VACCINE  07/17/2021   DEXA SCAN  Completed   HPV VACCINES  Aged Out    Allergies  Allergen Reactions   Amoxicillin Diarrhea and Rash    Allergies as of 07/12/2021       Reactions   Amoxicillin Diarrhea, Rash        Medication List        Accurate as of July 12, 2021 10:34 AM. If you have any questions, ask your nurse or doctor.  STOP taking these medications    levothyroxine 25 MCG tablet Commonly known as: SYNTHROID   PROBIOTIC PO   ZINC PO       TAKE these medications    acetaminophen 325 MG tablet Commonly known as: Tylenol Take 2 tablets (650 mg total) by mouth every 4 (four) hours as needed for mild pain, fever or headache.   B-complex with vitamin C tablet Take 1 tablet by mouth daily.   budesonide 3 MG 24 hr capsule Commonly known as: ENTOCORT EC Take 9 mg by mouth daily.   calcium carbonate 1250 (500 Ca) MG tablet Commonly known as: OS-CAL - dosed in mg of elemental calcium Take 1 tablet by mouth.   cholecalciferol 1000 units tablet Commonly known as: VITAMIN D Take 1,000 Units by mouth daily.   colestipol 1 g tablet Commonly known as: COLESTID Take 1 g by mouth 2 (two) times daily.   docusate sodium 100 MG capsule Commonly known as: Colace Take 1 capsule (100 mg total) by mouth 2 (two) times daily.   FISH OIL PO Take 1 Dose by mouth daily.   multivitamin with minerals Tabs tablet Take 1 tablet by mouth daily.   Oxycodone HCl 10 MG Tabs Take 1 tablet (10 mg total) by mouth every 6 (six) hours as needed for up to 7 days for severe pain.   polyethylene glycol 17 g packet Commonly known as: MiraLax Take 17 g by mouth daily as needed for mild  constipation.   PRESERVISION AREDS 2+MULTI VIT PO Take 1 Dose by mouth daily.   REFRESH OP Apply 1 drop to eye 3 (three) times daily as needed (Dry eyes).   saccharomyces boulardii 250 MG capsule Commonly known as: FLORASTOR Take 250 mg by mouth daily.   sertraline 50 MG tablet Commonly known as: ZOLOFT Take 25 mg by mouth daily.   traMADol 50 MG tablet Commonly known as: ULTRAM Take 1 tablet (50 mg total) by mouth every 6 (six) hours as needed for up to 7 days for moderate pain.   vitamin C 500 MG tablet Commonly known as: ASCORBIC ACID Take 1 tablet (500 mg total) by mouth daily.   Zinc 50 MG Tabs Take 1 tablet by mouth daily.   zinc oxide 20 % ointment Apply 1 application topically as needed for irritation.        Review of Systems  Constitutional:  Negative for activity change, appetite change, fatigue and fever.  HENT:  Negative for congestion, hearing loss, sore throat and trouble swallowing.   Eyes:  Negative for visual disturbance.  Respiratory:  Negative for cough, shortness of breath and wheezing.   Cardiovascular:  Negative for chest pain and leg swelling.  Gastrointestinal:  Positive for diarrhea. Negative for abdominal distention, abdominal pain, constipation and nausea.  Genitourinary:  Negative for dysuria, frequency and hematuria.  Musculoskeletal:  Positive for gait problem.       Right shoulder and hip pain  Skin: Negative.   Neurological:  Positive for weakness. Negative for dizziness and light-headedness.  Psychiatric/Behavioral:  Positive for dysphoric mood. Negative for confusion and sleep disturbance. The patient is not nervous/anxious.    Vitals:   07/12/21 1023  BP: 126/88  Pulse: 70  Resp: 16  Temp: 97.6 F (36.4 C)  SpO2: 97%  Weight: 178 lb 14.4 oz (81.1 kg)  Height: '5\' 3"'$  (1.6 m)   Body mass index is 31.69 kg/m. Physical Exam Vitals reviewed.  Constitutional:      General: She is  not in acute distress. HENT:     Head:  Normocephalic.     Right Ear: There is no impacted cerumen.     Left Ear: There is no impacted cerumen.     Nose: Nose normal.     Mouth/Throat:     Mouth: Mucous membranes are moist.     Pharynx: No posterior oropharyngeal erythema.  Eyes:     General:        Right eye: No discharge.        Left eye: No discharge.  Cardiovascular:     Rate and Rhythm: Normal rate and regular rhythm.     Pulses: Normal pulses.     Heart sounds: Normal heart sounds. No murmur heard. Pulmonary:     Effort: Pulmonary effort is normal. No respiratory distress.     Breath sounds: Normal breath sounds. No wheezing.  Abdominal:     General: Bowel sounds are normal. There is no distension.     Palpations: Abdomen is soft.     Tenderness: There is no abdominal tenderness.  Musculoskeletal:     Right upper arm: Swelling and tenderness present.     Cervical back: Normal range of motion.     Right lower leg: No swelling or tenderness. No edema.     Left lower leg: No edema.     Comments: RUE in sling, some bruising present, radial pulse 2+, cap refill < 3sec, fingers with full sensation. Right dorsiflexion 5/5, dorsal pedis 2+.   Lymphadenopathy:     Cervical: No cervical adenopathy.  Skin:    General: Skin is warm and dry.     Capillary Refill: Capillary refill takes less than 2 seconds.  Neurological:     General: No focal deficit present.     Mental Status: She is alert and oriented to person, place, and time.     Motor: Weakness present.     Gait: Gait abnormal.     Comments: walker  Psychiatric:        Mood and Affect: Mood normal.        Behavior: Behavior normal.    Labs reviewed: Basic Metabolic Panel: Recent Labs    07/08/21 2200 07/09/21 0550  NA 137 136  K 4.0 4.3  CL 104 103  CO2 24 26  GLUCOSE 109* 100*  BUN 14 11  CREATININE 0.98 0.88  CALCIUM 9.2 8.6*   Liver Function Tests: No results for input(s): AST, ALT, ALKPHOS, BILITOT, PROT, ALBUMIN in the last 8760 hours. No  results for input(s): LIPASE, AMYLASE in the last 8760 hours. No results for input(s): AMMONIA in the last 8760 hours. CBC: Recent Labs    07/08/21 2200 07/09/21 0550  WBC 8.4 7.5  HGB 14.8 13.9  HCT 44.7 42.8  MCV 97.6 98.8  PLT 200 187   Cardiac Enzymes: No results for input(s): CKTOTAL, CKMB, CKMBINDEX, TROPONINI in the last 8760 hours. BNP: Invalid input(s): POCBNP No results found for: HGBA1C No results found for: TSH No results found for: VITAMINB12 No results found for: FOLATE No results found for: IRON, TIBC, FERRITIN  Imaging and Procedures obtained prior to SNF admission: No results found.  Assessment/Plan 1. Inferior pubic ramus fracture, right, sequela - follow by Dr. Marlou Sa- recommend non operative recovery/ f/u in 2 weeks - cont PT/OT - WBAT - cont tramadol for moderate pain and oxycodone for severe pain  2. Fx humeral neck, right, sequela - see above - remain in sling x 3 weeks, then pendulum  exercises - cont ice for swelling and pain- 30 min on/ 30 min off  3. Other specified hypothyroidism - TSH unknown  - u/s 2019 indicated subcentimeter nodules - asymptomatic - cont levothyroxine 25 mcg daily  4. Bilateral dry eyes - stable with refresh drops  5. Collagenous colitis - ongoing - followed by GI - cont budesonide 9 mg daily till 07/30, then reduce to 6 mg daily - cont colestipol  6. BMI 31.0-31.9,adult - lifestyle changes discussed  7. Osteopenia of multiple sites - DEXA 2021, t-score right femur -2.1 - cont calcium and vitamin d supplement  8. Depression, recurrent ([place]) - stable with zoloft 50 mg daily    Family/ staff Communication: plan discussed with patient and nurse  Labs/tests ordered: none

## 2021-07-13 ENCOUNTER — Encounter: Payer: Self-pay | Admitting: Internal Medicine

## 2021-07-13 ENCOUNTER — Non-Acute Institutional Stay (SKILLED_NURSING_FACILITY): Payer: PPO | Admitting: Internal Medicine

## 2021-07-13 DIAGNOSIS — E038 Other specified hypothyroidism: Secondary | ICD-10-CM

## 2021-07-13 DIAGNOSIS — S42211S Unspecified displaced fracture of surgical neck of right humerus, sequela: Secondary | ICD-10-CM | POA: Diagnosis not present

## 2021-07-13 DIAGNOSIS — F339 Major depressive disorder, recurrent, unspecified: Secondary | ICD-10-CM | POA: Diagnosis not present

## 2021-07-13 DIAGNOSIS — S32591S Other specified fracture of right pubis, sequela: Secondary | ICD-10-CM

## 2021-07-13 DIAGNOSIS — K52831 Collagenous colitis: Secondary | ICD-10-CM | POA: Diagnosis not present

## 2021-07-13 NOTE — Progress Notes (Signed)
Provider:  Veleta Miners MD Location:   [facility] Room Number: 35 Place of Service:  SNF ([id number])  PCP: Josetta Huddle, MD Patient Care Team: Josetta Huddle, MD as PCP - General (Internal Medicine)  Extended Emergency Contact Information Primary Emergency Contact: Alen Blew A Address: [address]          [place], [place] [address] [place] Phone: [phone] Relation: Spouse Secondary Emergency Contact: Rachal,Cindy Address: [address]          [place], [place] [address] Johnnette Litter of [place] Phone: [phone] Relation: Daughter  Code Status: Managed Care Goals of Care: Advanced Directive information Advanced Directives 07/13/2021  Does Patient Have a Medical Advance Directive? Yes  Type of Paramedic of [place];Living will;Out of facility DNR (pink MOST or yellow form)  Does patient want to make changes to medical advance directive? No - Patient declined  Copy of [place] in Chart? Yes - validated most recent copy scanned in chart (See row information)  Would patient like information on creating a medical advance directive? -  Pre-existing out of facility DNR order (yellow form or pink MOST form) Pink MOST form placed in chart (order not valid for inpatient use)      Chief Complaint  Patient presents with   New Admit To SNF    Admission to SNF    HPI: Patient is a 80 y.o. female seen today for admission to SNF for therapy  Admitted to Hospital from 7/23-7/27 for Inferior Pubic Rami Fracture and Right Humeral Fracture  Patient with H/o Hypothyroidism, Microscopic Colitis, Depression   She was playing Pickle ball when she [place].  Came to ED with right shoulder pain and pelvic pain.  Was found to have a right humeral neck fracture and right inferior pubic ramus fracture Per orthopedics nonoperative management with immobilization of right shoulder with sling for 3 weeks  and then slow mobilization Weightbearing as tolerated pubic rami fracture This morning patient was very upset.  She wants to know if we can schedule her pain medications as she is in constant pain.  Not having any issues with diarrhea. No other problems right now  Past Medical History:  Diagnosis Date   Anxiety    Colitis    GERD (gastroesophageal reflux disease)    Headache    Hypothyroidism    Microscopic colitis    Past Surgical History:  Procedure Laterality Date   ABDOMINAL HYSTERECTOMY     partial   BACK SURGERY  nov 2011   lower back   BALLOON DILATION N/A 01/25/2015   Procedure: BALLOON DILATION;  Surgeon: Garlan Fair, MD;  Location: [facility];  Service: Endoscopy;  Laterality: N/A;   BIOPSY BREAST Left yrs ago   benign   COLONOSCOPY WITH PROPOFOL N/A 01/25/2015   Procedure: COLONOSCOPY WITH PROPOFOL;  Surgeon: Garlan Fair, MD;  [facility]: [facility];  Service: Endoscopy;  Laterality: N/A;   ESOPHAGOGASTRODUODENOSCOPY (EGD) WITH PROPOFOL N/A 01/25/2015   Procedure: ESOPHAGOGASTRODUODENOSCOPY (EGD) WITH PROPOFOL;  Surgeon: Garlan Fair, MD;  [facility]: [facility];  Service: Endoscopy;  Laterality: N/A;   KNEE SURGERY     OPEN REDUCTION INTERNAL FIXATION (ORIF) DISTAL RADIAL FRACTURE Left 01/21/2016   Procedure: OPEN REDUCTION INTERNAL FIXATION (ORIF) DISTAL RADIAL FRACTURE;  Surgeon: Iran Planas, MD;  Location: [facility];  Service: Orthopedics;  Laterality: Left;   rotator cuffr Right    SHOULDER ARTHROSCOPY     thyroid  nodule removed  1970   TONSILLECTOMY  age 14   WRIST FRACTURE SURGERY      reports that she has never smoked. She has never used smokeless tobacco. She reports current alcohol use. She reports that she does not use drugs. Social History   Socioeconomic History   Marital status: Married    Spouse name: Not on file   Number of children: Not on file   Years of education: Not on file   Highest education level: Not on file  Occupational History    Not on file  Tobacco Use   Smoking status: Never   Smokeless tobacco: Never  Vaping Use   Vaping Use: Never used  Substance and Sexual Activity   Alcohol use: Yes    Comment: occasional   Drug use: Never   Sexual activity: Not on file  Other Topics Concern   Not on file  Social History Narrative   Not on file   Social Determinants of Health   Financial Resource Strain: Not on file  Food Insecurity: Not on file  Transportation Needs: Not on file  Physical Activity: Not on file  Stress: Not on file  Social Connections: Not on file  Intimate Partner Violence: Not on file    Functional Status Survey:    Family History  Problem Relation Age of Onset   Diabetes Mother    Other Mother        Zollinger-Ellison syndrome   Heart attack Father 18       Died of MI at 22 yo.   Goiter Paternal Grandmother     Health Maintenance  Topic Date Due   COVID-19 Vaccine (1) Never done   TETANUS/TDAP  Never done   Zoster Vaccines- Shingrix (1 of 2) Never done   PNA vac Low Risk Adult (1 of 2 - PCV13) Never done   INFLUENZA VACCINE  07/17/2021   DEXA SCAN  Completed   HPV VACCINES  Aged Out    Allergies  Allergen Reactions   Amoxicillin Diarrhea and Rash    Allergies as of 07/13/2021       Reactions   Amoxicillin Diarrhea, Rash        Medication List        Accurate as of July 13, 2021 11:07 AM. If you have any questions, ask your nurse or doctor.          acetaminophen 325 MG tablet Commonly known as: Tylenol Take 2 tablets (650 mg total) by mouth every 4 (four) hours as needed for mild pain, fever or headache.   B-complex with vitamin C tablet Take 1 tablet by mouth daily.   budesonide 3 MG 24 hr capsule Commonly known as: ENTOCORT EC Take 9 mg by mouth daily.   calcium carbonate 1250 (500 Ca) MG tablet Commonly known as: OS-CAL - dosed in mg of elemental calcium Take 1 tablet by mouth.   cholecalciferol 1000 units tablet Commonly known as:  VITAMIN D Take 1,000 Units by mouth daily.   colestipol 1 g tablet Commonly known as: COLESTID Take 1 g by mouth 2 (two) times daily.   docusate sodium 100 MG capsule Commonly known as: Colace Take 1 capsule (100 mg total) by mouth 2 (two) times daily.   FISH OIL PO Take 1 Dose by mouth daily.   multivitamin with minerals Tabs tablet Take 1 tablet by mouth daily.   Oxycodone HCl 10 MG Tabs Take 1 tablet (10 mg total) by mouth every 6 (six)  hours as needed for up to 7 days for severe pain.   polyethylene glycol 17 g packet Commonly known as: MiraLax Take 17 g by mouth daily as needed for mild constipation.   PRESERVISION AREDS 2+MULTI VIT PO Take 1 Dose by mouth daily.   REFRESH OP Apply 1 drop to eye 3 (three) times daily as needed (Dry eyes).   saccharomyces boulardii 250 MG capsule Commonly known as: FLORASTOR Take 250 mg by mouth daily.   sertraline 50 MG tablet Commonly known as: ZOLOFT Take 25 mg by mouth daily.   traMADol 50 MG tablet Commonly known as: ULTRAM Take 1 tablet (50 mg total) by mouth every 6 (six) hours as needed for up to 7 days for moderate pain.   vitamin C 500 MG tablet Commonly known as: ASCORBIC ACID Take 1 tablet (500 mg total) by mouth daily.   Zinc 50 MG Tabs Take 1 tablet by mouth daily.   zinc oxide 20 % ointment Apply 1 application topically as needed for irritation.        Review of Systems  Constitutional:  Positive for activity change.  HENT: Negative.    Respiratory: Negative.    Cardiovascular: Negative.   Gastrointestinal:  Positive for constipation.  Genitourinary: Negative.   Musculoskeletal:  Positive for gait problem.  Skin: Negative.   Neurological:  Positive for weakness.  Psychiatric/Behavioral:  Positive for dysphoric mood.    Vitals:   07/13/21 1102  BP: 114/64  Pulse: 80  Resp: 16  Temp: (!) 97.2 F (36.2 C)  SpO2: 96%  Weight: 176 lb (79.8 kg)  Height: '5\' 3"'$  (1.6 m)   Body mass index is  31.18 kg/m. Physical Exam Constitutional: Oriented to person, place, and time. Well-developed and well-nourished.  HENT:  Head: Normocephalic.  Mouth/Throat: Oropharynx is clear and moist.  Eyes: Pupils are equal, round, and reactive to light.  Neck: Neck supple.  Cardiovascular: Normal rate and normal heart sounds.  No murmur heard. Pulmonary/Chest: Effort normal and breath sounds normal. No respiratory distress. No wheezes. She has no rales.  Abdominal: Soft. Bowel sounds are normal. No distension. There is no tenderness. There is no rebound.  Musculoskeletal: No edema.  Lymphadenopathy: none Neurological: Alert and oriented to person, place, and time.  Skin: Skin is warm and dry.  Psychiatric: Normal mood and affect. Behavior is normal. Thought content normal.   Labs reviewed: Basic Metabolic Panel: Recent Labs    07/08/21 2200 07/09/21 0550  NA 137 136  K 4.0 4.3  CL 104 103  CO2 24 26  GLUCOSE 109* 100*  BUN 14 11  CREATININE 0.98 0.88  CALCIUM 9.2 8.6*   Liver Function Tests: No results for input(s): AST, ALT, ALKPHOS, BILITOT, PROT, ALBUMIN in the last 8760 hours. No results for input(s): LIPASE, AMYLASE in the last 8760 hours. No results for input(s): AMMONIA in the last 8760 hours. CBC: Recent Labs    07/08/21 2200 07/09/21 0550  WBC 8.4 7.5  HGB 14.8 13.9  HCT 44.7 42.8  MCV 97.6 98.8  PLT 200 187   Cardiac Enzymes: No results for input(s): CKTOTAL, CKMB, CKMBINDEX, TROPONINI in the last 8760 hours. BNP: Invalid input(s): POCBNP No results found for: HGBA1C No results found for: TSH No results found for: VITAMINB12 No results found for: FOLATE No results found for: IRON, TIBC, FERRITIN  Imaging and Procedures obtained prior to SNF admission: No results found.  Assessment/Plan Inferior pubic ramus fracture, right, sequela Change Oxycodone to QID schedeuled Use  tramadol PRN for Breakthrough Pain WBAT Fx humeral neck, right, sequela In Sling  for 3 weeks and then Weight bearing as tolerated  Other specified hypothyroidism Follow with her PCP Collagenous colitis On Budesonide 9 mg Dose to be reduced to 6 mg per patient on July 31 Per her GI Dr Luan Pulling She is having some Constipation but does nto want to stop Colestipol right now Depression, recurrent ([place]) On Zoloft  Discuss Covid Poliucy wuth her Has to stay in Isolation for 10 days due to not getting 4th booster  Family/ staff Communication:   Labs/tests ordered:  Total time spent in this patient care encounter was  45_  minutes; greater than 50% of the visit spent counseling patient and staff, reviewing records , Labs and coordinating care for problems addressed at this encounter.

## 2021-07-17 DIAGNOSIS — H04129 Dry eye syndrome of unspecified lacrimal gland: Secondary | ICD-10-CM | POA: Diagnosis not present

## 2021-07-17 DIAGNOSIS — G4700 Insomnia, unspecified: Secondary | ICD-10-CM | POA: Diagnosis not present

## 2021-07-17 DIAGNOSIS — R2681 Unsteadiness on feet: Secondary | ICD-10-CM | POA: Diagnosis not present

## 2021-07-17 DIAGNOSIS — M6281 Muscle weakness (generalized): Secondary | ICD-10-CM | POA: Diagnosis not present

## 2021-07-17 DIAGNOSIS — S32591A Other specified fracture of right pubis, initial encounter for closed fracture: Secondary | ICD-10-CM | POA: Diagnosis not present

## 2021-07-17 DIAGNOSIS — S32591D Other specified fracture of right pubis, subsequent encounter for fracture with routine healing: Secondary | ICD-10-CM | POA: Diagnosis not present

## 2021-07-17 DIAGNOSIS — S32591S Other specified fracture of right pubis, sequela: Secondary | ICD-10-CM | POA: Diagnosis not present

## 2021-07-17 DIAGNOSIS — K219 Gastro-esophageal reflux disease without esophagitis: Secondary | ICD-10-CM | POA: Diagnosis not present

## 2021-07-17 DIAGNOSIS — F419 Anxiety disorder, unspecified: Secondary | ICD-10-CM | POA: Diagnosis not present

## 2021-07-17 DIAGNOSIS — M25511 Pain in right shoulder: Secondary | ICD-10-CM | POA: Diagnosis not present

## 2021-07-17 DIAGNOSIS — F339 Major depressive disorder, recurrent, unspecified: Secondary | ICD-10-CM | POA: Diagnosis not present

## 2021-07-17 DIAGNOSIS — K52831 Collagenous colitis: Secondary | ICD-10-CM | POA: Diagnosis not present

## 2021-07-17 DIAGNOSIS — S329XXA Fracture of unspecified parts of lumbosacral spine and pelvis, initial encounter for closed fracture: Secondary | ICD-10-CM | POA: Diagnosis not present

## 2021-07-17 DIAGNOSIS — R2689 Other abnormalities of gait and mobility: Secondary | ICD-10-CM | POA: Diagnosis not present

## 2021-07-17 DIAGNOSIS — E039 Hypothyroidism, unspecified: Secondary | ICD-10-CM | POA: Diagnosis not present

## 2021-07-17 DIAGNOSIS — J309 Allergic rhinitis, unspecified: Secondary | ICD-10-CM | POA: Diagnosis not present

## 2021-07-17 DIAGNOSIS — S42211A Unspecified displaced fracture of surgical neck of right humerus, initial encounter for closed fracture: Secondary | ICD-10-CM | POA: Diagnosis not present

## 2021-07-17 DIAGNOSIS — S42211S Unspecified displaced fracture of surgical neck of right humerus, sequela: Secondary | ICD-10-CM | POA: Diagnosis not present

## 2021-07-17 DIAGNOSIS — S42211D Unspecified displaced fracture of surgical neck of right humerus, subsequent encounter for fracture with routine healing: Secondary | ICD-10-CM | POA: Diagnosis not present

## 2021-07-17 DIAGNOSIS — G2581 Restless legs syndrome: Secondary | ICD-10-CM | POA: Diagnosis not present

## 2021-07-19 DIAGNOSIS — S32591A Other specified fracture of right pubis, initial encounter for closed fracture: Secondary | ICD-10-CM | POA: Diagnosis not present

## 2021-07-19 DIAGNOSIS — S42211A Unspecified displaced fracture of surgical neck of right humerus, initial encounter for closed fracture: Secondary | ICD-10-CM | POA: Diagnosis not present

## 2021-07-19 DIAGNOSIS — S329XXA Fracture of unspecified parts of lumbosacral spine and pelvis, initial encounter for closed fracture: Secondary | ICD-10-CM | POA: Diagnosis not present

## 2021-07-19 DIAGNOSIS — M25511 Pain in right shoulder: Secondary | ICD-10-CM | POA: Diagnosis not present

## 2021-07-26 ENCOUNTER — Ambulatory Visit: Payer: PPO | Admitting: Orthopedic Surgery

## 2021-08-03 ENCOUNTER — Non-Acute Institutional Stay (SKILLED_NURSING_FACILITY): Payer: PPO | Admitting: Internal Medicine

## 2021-08-03 ENCOUNTER — Encounter: Payer: Self-pay | Admitting: Internal Medicine

## 2021-08-03 DIAGNOSIS — F339 Major depressive disorder, recurrent, unspecified: Secondary | ICD-10-CM | POA: Diagnosis not present

## 2021-08-03 DIAGNOSIS — S42211S Unspecified displaced fracture of surgical neck of right humerus, sequela: Secondary | ICD-10-CM | POA: Diagnosis not present

## 2021-08-03 DIAGNOSIS — S32591S Other specified fracture of right pubis, sequela: Secondary | ICD-10-CM

## 2021-08-03 DIAGNOSIS — K52831 Collagenous colitis: Secondary | ICD-10-CM

## 2021-08-03 NOTE — Progress Notes (Signed)
Location:   [facility] Room Number: 35 Place of Service:  SNF [id number])  Provider: Veleta Miners MD  PCP: Kylie Huddle, MD Patient Care Team: Kylie Huddle, MD as PCP - General (Internal Medicine)  Extended Emergency Contact Information Primary Emergency Contact: Kylie Christensen Address: [address] [name], [place] [address] [place] Phone: [phone] Relation: Spouse Secondary Emergency Contact: Kylie Christensen Address: [address]          [place], [place] [address] [place] Phone: [phone] Relation: Daughter  Code Status: Full Code Managed Care Goals of care:  Advanced Directive information Advanced Directives 08/03/2021  Does Patient Have a Medical Advance Directive? Yes  Type of Paramedic of [place];Living will;Out of facility DNR (pink MOST or yellow form)  Does patient want to make changes to medical advance directive? No - Patient declined  Copy of [place] in Chart? Yes - validated most recent copy scanned in chart (See row information)  Would patient like information on creating a medical advance directive? -  Pre-existing out of facility DNR order (yellow form or pink MOST form) Pink MOST form placed in chart (order not valid for inpatient use)     Allergies  Allergen Reactions   Amoxicillin Diarrhea and Rash    Chief Complaint  Patient presents with   Discharge Note    Discharge from SNF    HPI:  80 y.o. female  for discharge  Admitted to [facility] from 7/23-7/27 for Inferior Pubic Rami Fracture and Right Humeral Fracture   Patient with H/o Hypothyroidism, Microscopic Colitis, Depression    She was playing Pickle ball when she [place].  Came to ED with right shoulder pain and pelvic pain.  Was found to have a right humeral neck fracture and right inferior pubic ramus fracture Per orthopedics nonoperative management with immobilization  of right shoulder with sling for 3 weeks and then slow mobilization Weightbearing as tolerated pubic rami fracture  Patient is planning to go back to her apartment with help of her husband She is doing well. Walking with hemiwalker. Needs help with her Dressing and plan to use Home health. Will continue OT Pain is controlled Diarrhea is controlled  Past Medical History:  Diagnosis Date   Anxiety    Colitis    GERD (gastroesophageal reflux disease)    Headache    Hypothyroidism    Microscopic colitis     Past Surgical History:  Procedure Laterality Date   ABDOMINAL HYSTERECTOMY     partial   BACK SURGERY  nov 2011   lower back   BALLOON DILATION N/A 01/25/2015   Procedure: BALLOON DILATION;  Surgeon: Garlan Fair, MD;  [facility];  Service: Endoscopy;  Laterality: N/A;   BIOPSY BREAST Left yrs ago   benign   COLONOSCOPY WITH PROPOFOL N/A 01/25/2015   Procedure: COLONOSCOPY WITH PROPOFOL;  Surgeon: Garlan Fair, MD;  [facility];  Service: Endoscopy;  Laterality: N/A;   ESOPHAGOGASTRODUODENOSCOPY (EGD) WITH PROPOFOL N/A 01/25/2015   Procedure: ESOPHAGOGASTRODUODENOSCOPY (EGD) WITH PROPOFOL;  Surgeon: Garlan Fair, MD;  [facility];  Service: Endoscopy;  Laterality: N/A;   KNEE SURGERY     OPEN REDUCTION INTERNAL FIXATION (ORIF) DISTAL RADIAL FRACTURE Left 01/21/2016   Procedure: OPEN REDUCTION INTERNAL FIXATION (ORIF) DISTAL RADIAL FRACTURE;  Surgeon: Iran Planas, MD;  Location: [facility];  Service: Orthopedics;  Laterality: Left;  rotator cuffr Right    SHOULDER ARTHROSCOPY     thyroid nodule removed  1970   TONSILLECTOMY  age 72   WRIST FRACTURE SURGERY        reports that she has never smoked. She has never used smokeless tobacco. She reports current alcohol use. She reports that she does not use drugs. Social History   Socioeconomic History   Marital status: Married    Spouse name: Not on file   Number of children: Not on file    Years of education: Not on file   Highest education level: Not on file  Occupational History   Not on file  Tobacco Use   Smoking status: Never   Smokeless tobacco: Never  Vaping Use   Vaping Use: Never used  Substance and Sexual Activity   Alcohol use: Yes    Comment: occasional   Drug use: Never   Sexual activity: Not on file  Other Topics Concern   Not on file  Social History Narrative   Not on file   Social Determinants of Health   Financial Resource Strain: Not on file  Food Insecurity: Not on file  Transportation Needs: Not on file  Physical Activity: Not on file  Stress: Not on file  Social Connections: Not on file  Intimate Partner Violence: Not on file   Functional Status Survey:    Allergies  Allergen Reactions   Amoxicillin Diarrhea and Rash    Pertinent  Health Maintenance Due  Topic Date Due   PNA vac Low Risk Adult (1 of 2 - PCV13) Never done   INFLUENZA VACCINE  07/17/2021   DEXA SCAN  Completed    Medications: Allergies as of 08/03/2021       Reactions   Amoxicillin Diarrhea, Rash        Medication List        Accurate as of August 03, 2021  3:26 PM. If you have any questions, ask your nurse or doctor.          STOP taking these medications    polyethylene glycol 17 g packet Commonly known as: MiraLax Stopped by: Virgie Dad, MD   REFRESH OP Stopped by: Virgie Dad, MD   vitamin C 500 MG tablet Commonly known as: ASCORBIC ACID Stopped by: Virgie Dad, MD   Zinc 50 MG Tabs Stopped by: Virgie Dad, MD       TAKE these medications    acetaminophen 325 MG tablet Commonly known as: Tylenol Take 2 tablets (650 mg total) by mouth every 4 (four) hours as needed for mild pain, fever or headache.   B-complex with vitamin C tablet Take 1 tablet by mouth daily.   budesonide 3 MG 24 hr capsule Commonly known as: ENTOCORT EC Take 9 mg by mouth daily.   calcium carbonate 1250 (500 Ca) MG tablet Commonly known  as: OS-CAL - dosed in mg of elemental calcium Take 1 tablet by mouth.   cholecalciferol 1000 units tablet Commonly known as: VITAMIN D Take 1,000 Units by mouth daily.   colestipol 1 g tablet Commonly known as: COLESTID Take 1 g by mouth 2 (two) times daily.   docusate sodium 100 MG capsule Commonly known as: Colace Take 1 capsule (100 mg total) by mouth 2 (two) times daily.   FISH OIL PO Take 1 Dose by mouth daily.   levothyroxine 25 MCG tablet Commonly known as: SYNTHROID Take 25 mcg by mouth daily before breakfast.   multivitamin  with minerals Tabs tablet Take 1 tablet by mouth daily.   Oxycodone HCl 10 MG Tabs Take 10 mg by mouth every 6 (six) hours.   PRESERVISION AREDS 2+MULTI VIT PO Take 1 Dose by mouth daily.   saccharomyces boulardii 250 MG capsule Commonly known as: FLORASTOR Take 250 mg by mouth daily.   sertraline 50 MG tablet Commonly known as: ZOLOFT Take 25 mg by mouth daily.   traMADol 50 MG tablet Commonly known as: ULTRAM Take by mouth every 6 (six) hours.   zinc oxide 20 % ointment Apply 1 application topically as needed for irritation.        Review of Systems  Vitals:   08/03/21 1515  BP: 110/75  Pulse: 91  Resp: 20  Temp: (!) 97.5 F (36.4 C)  SpO2: 97%  Weight: 168 lb 14.4 oz (76.6 kg)  Height: '5\' 3"'$  (1.6 m)   Body mass index is 29.92 kg/m. Physical Exam Constitutional: Oriented to person, place, and time. Well-developed and well-nourished.  HENT:  Head: Normocephalic.  Mouth/Throat: Oropharynx is clear and moist.  Eyes: Pupils are equal, round, and reactive to light.  Neck: Neck supple.  Cardiovascular: Normal rate and normal heart sounds.  No murmur heard. Pulmonary/Chest: Effort normal and breath sounds normal. No respiratory distress. No wheezes. She has no rales.  Abdominal: Soft. Bowel sounds are normal. No distension. There is no tenderness. There is no rebound.  Musculoskeletal: No edema. Right Shoulder in  Sling Lymphadenopathy: none Neurological: Alert and oriented to person, place, and time.  Skin: Skin is warm and dry.  Psychiatric: Normal mood and affect. Behavior is normal. Thought content normal.   Labs reviewed: Basic Metabolic Panel: Recent Labs    07/08/21 2200 07/09/21 0550  NA 137 136  K 4.0 4.3  CL 104 103  CO2 24 26  GLUCOSE 109* 100*  BUN 14 11  CREATININE 0.98 0.88  CALCIUM 9.2 8.6*   Liver Function Tests: No results for input(s): AST, ALT, ALKPHOS, BILITOT, PROT, ALBUMIN in the last 8760 hours. No results for input(s): LIPASE, AMYLASE in the last 8760 hours. No results for input(s): AMMONIA in the last 8760 hours. CBC: Recent Labs    07/08/21 2200 07/09/21 0550  WBC 8.4 7.5  HGB 14.8 13.9  HCT 44.7 42.8  MCV 97.6 98.8  PLT 200 187   Cardiac Enzymes: No results for input(s): CKTOTAL, CKMB, CKMBINDEX, TROPONINI in the last 8760 hours. BNP: Invalid input(s): POCBNP CBG: No results for input(s): GLUCAP in the last 8760 hours.  Procedures and Imaging Studies During Stay: DG Shoulder Right  Result Date: 07/07/2021 CLINICAL DATA:  Right shoulder pain after fall. EXAM: RIGHT SHOULDER - 2+ VIEW COMPARISON:  None. FINDINGS: Comminuted and impacted fracture is seen involving the right humeral neck. No dislocation is noted. IMPRESSION: Comminuted and impacted right humeral neck fracture. Electronically Signed   By: Marijo Conception M.D.   On: 07/07/2021 15:26   DG Elbow Complete Right  Result Date: 07/07/2021 CLINICAL DATA:  Right elbow pain after fall. EXAM: RIGHT ELBOW - COMPLETE 3+ VIEW COMPARISON:  None. FINDINGS: There is no evidence of fracture, dislocation, or joint effusion. There is no evidence of arthropathy or other focal bone abnormality. Soft tissues are unremarkable. IMPRESSION: Negative. Electronically Signed   By: Marijo Conception M.D.   On: 07/07/2021 15:24   CT PELVIS WO CONTRAST  Result Date: 07/07/2021 CLINICAL DATA:  Right hip pain after  fall. EXAM: CT PELVIS WITHOUT CONTRAST TECHNIQUE:  Multidetector CT imaging of the pelvis was performed following the standard protocol without intravenous contrast. COMPARISON:  January 19, 2021. FINDINGS: Urinary Tract:  No abnormality visualized. Bowel:  Unremarkable visualized pelvic bowel loops. Vascular/Lymphatic: No pathologically enlarged lymph nodes. No significant vascular abnormality seen. Reproductive: Status post hysterectomy. No adnexal abnormality is noted. Other: Small hematoma is seen anteriorly and to the right of the urinary bladder in the pelvis. Musculoskeletal: Minimally displaced fracture is seen involving the right superior pubic ramus. Hip joints are unremarkable. IMPRESSION: Minimally displaced fracture is seen involving the right inferior pubic ramus. There is an adjacent small hematoma which is anterior to the right of the urinary bladder in the pelvis. Electronically Signed   By: Marijo Conception M.D.   On: 07/07/2021 16:25   DG Foot Complete Left  Result Date: 07/10/2021 CLINICAL DATA:  Left foot pain EXAM: LEFT FOOT - COMPLETE 3+ VIEW COMPARISON:  None. FINDINGS: Old healed distal 5th metatarsal fracture. No acute bony abnormality. Specifically, no fracture, subluxation, or dislocation. IMPRESSION: No acute bony abnormality. Electronically Signed   By: Rolm Baptise M.D.   On: 07/10/2021 10:08   DG Hip Unilat  With Pelvis 2-3 Views Right  Result Date: 07/07/2021 CLINICAL DATA:  Right hip pain after fall today. EXAM: DG HIP (WITH OR WITHOUT PELVIS) 2-3V RIGHT COMPARISON:  None. FINDINGS: There is no evidence of hip fracture or dislocation. There is no evidence of arthropathy or other focal bone abnormality. IMPRESSION: Negative. Electronically Signed   By: Marijo Conception M.D.   On: 07/07/2021 15:23    Assessment/Plan:   Inferior pubic ramus fracture, right, sequela On Oxycodone and Tramadol for pain WBAT Doing well with her walker Fx humeral neck, right, sequela Follow  with Dr Onnie Graham In Sling for now till follow up Collagenous colitis Continue Budesonide Follow with GI Depression, recurrent ([place]) Continue Zoloft hypothyroidism Follow with her PCP   Patient is being discharged with the following home health services:  OT and Home health  Patient is being discharged with the following durable medical equipment:  Hemi walker  Patient has been advised to f/u with their PCP in 1-2 weeks to bring them up to date on their rehab stay.  Social services at facility was responsible for arranging this appointment.  Pt was provided with a 30 day supply of prescriptions for medications and refills must be obtained from their PCP.  For controlled substances, a more limited supply may be provided adequate until PCP appointment only.  Future labs/tests needed:

## 2021-08-07 DIAGNOSIS — S32591D Other specified fracture of right pubis, subsequent encounter for fracture with routine healing: Secondary | ICD-10-CM | POA: Diagnosis not present

## 2021-08-07 DIAGNOSIS — M6281 Muscle weakness (generalized): Secondary | ICD-10-CM | POA: Diagnosis not present

## 2021-08-07 DIAGNOSIS — S42211D Unspecified displaced fracture of surgical neck of right humerus, subsequent encounter for fracture with routine healing: Secondary | ICD-10-CM | POA: Diagnosis not present

## 2021-08-09 DIAGNOSIS — L820 Inflamed seborrheic keratosis: Secondary | ICD-10-CM | POA: Diagnosis not present

## 2021-08-09 DIAGNOSIS — L304 Erythema intertrigo: Secondary | ICD-10-CM | POA: Diagnosis not present

## 2021-08-16 DIAGNOSIS — S32591A Other specified fracture of right pubis, initial encounter for closed fracture: Secondary | ICD-10-CM | POA: Diagnosis not present

## 2021-08-16 DIAGNOSIS — S42211D Unspecified displaced fracture of surgical neck of right humerus, subsequent encounter for fracture with routine healing: Secondary | ICD-10-CM | POA: Diagnosis not present

## 2021-08-18 DIAGNOSIS — S42211D Unspecified displaced fracture of surgical neck of right humerus, subsequent encounter for fracture with routine healing: Secondary | ICD-10-CM | POA: Diagnosis not present

## 2021-08-18 DIAGNOSIS — Z9181 History of falling: Secondary | ICD-10-CM | POA: Diagnosis not present

## 2021-08-18 DIAGNOSIS — M545 Low back pain, unspecified: Secondary | ICD-10-CM | POA: Diagnosis not present

## 2021-08-18 DIAGNOSIS — S32591D Other specified fracture of right pubis, subsequent encounter for fracture with routine healing: Secondary | ICD-10-CM | POA: Diagnosis not present

## 2021-08-18 DIAGNOSIS — M6281 Muscle weakness (generalized): Secondary | ICD-10-CM | POA: Diagnosis not present

## 2021-09-01 DIAGNOSIS — R1031 Right lower quadrant pain: Secondary | ICD-10-CM | POA: Diagnosis not present

## 2021-09-01 DIAGNOSIS — R61 Generalized hyperhidrosis: Secondary | ICD-10-CM | POA: Diagnosis not present

## 2021-09-01 DIAGNOSIS — M545 Low back pain, unspecified: Secondary | ICD-10-CM | POA: Diagnosis not present

## 2021-09-01 DIAGNOSIS — G2581 Restless legs syndrome: Secondary | ICD-10-CM | POA: Diagnosis not present

## 2021-09-07 DIAGNOSIS — K52831 Collagenous colitis: Secondary | ICD-10-CM | POA: Diagnosis not present

## 2021-09-07 DIAGNOSIS — K529 Noninfective gastroenteritis and colitis, unspecified: Secondary | ICD-10-CM | POA: Diagnosis not present

## 2021-09-07 DIAGNOSIS — R112 Nausea with vomiting, unspecified: Secondary | ICD-10-CM | POA: Diagnosis not present

## 2021-09-08 ENCOUNTER — Other Ambulatory Visit: Payer: Self-pay | Admitting: Gastroenterology

## 2021-09-08 DIAGNOSIS — R112 Nausea with vomiting, unspecified: Secondary | ICD-10-CM

## 2021-09-15 ENCOUNTER — Ambulatory Visit
Admission: RE | Admit: 2021-09-15 | Discharge: 2021-09-15 | Disposition: A | Discharge status: Home / Self Care | Payer: PPO | Source: Ambulatory Visit | Attending: Gastroenterology | Admitting: Gastroenterology

## 2021-09-15 DIAGNOSIS — R112 Nausea with vomiting, unspecified: Secondary | ICD-10-CM

## 2021-09-19 DIAGNOSIS — Z9181 History of falling: Secondary | ICD-10-CM | POA: Diagnosis not present

## 2021-09-19 DIAGNOSIS — M6281 Muscle weakness (generalized): Secondary | ICD-10-CM | POA: Diagnosis not present

## 2021-09-19 DIAGNOSIS — S42211D Unspecified displaced fracture of surgical neck of right humerus, subsequent encounter for fracture with routine healing: Secondary | ICD-10-CM | POA: Diagnosis not present

## 2021-09-19 DIAGNOSIS — S32591D Other specified fracture of right pubis, subsequent encounter for fracture with routine healing: Secondary | ICD-10-CM | POA: Diagnosis not present

## 2021-09-19 DIAGNOSIS — M545 Low back pain, unspecified: Secondary | ICD-10-CM | POA: Diagnosis not present

## 2021-09-22 DIAGNOSIS — S42211D Unspecified displaced fracture of surgical neck of right humerus, subsequent encounter for fracture with routine healing: Secondary | ICD-10-CM | POA: Diagnosis not present

## 2021-10-19 DIAGNOSIS — S42211D Unspecified displaced fracture of surgical neck of right humerus, subsequent encounter for fracture with routine healing: Secondary | ICD-10-CM | POA: Diagnosis not present

## 2021-10-19 DIAGNOSIS — M6281 Muscle weakness (generalized): Secondary | ICD-10-CM | POA: Diagnosis not present

## 2021-10-19 DIAGNOSIS — S32591D Other specified fracture of right pubis, subsequent encounter for fracture with routine healing: Secondary | ICD-10-CM | POA: Diagnosis not present

## 2021-11-03 DIAGNOSIS — S42211D Unspecified displaced fracture of surgical neck of right humerus, subsequent encounter for fracture with routine healing: Secondary | ICD-10-CM | POA: Diagnosis not present

## 2021-11-03 DIAGNOSIS — S32591D Other specified fracture of right pubis, subsequent encounter for fracture with routine healing: Secondary | ICD-10-CM | POA: Diagnosis not present

## 2021-11-03 DIAGNOSIS — M6281 Muscle weakness (generalized): Secondary | ICD-10-CM | POA: Diagnosis not present

## 2021-11-08 DIAGNOSIS — S42211D Unspecified displaced fracture of surgical neck of right humerus, subsequent encounter for fracture with routine healing: Secondary | ICD-10-CM | POA: Diagnosis not present

## 2021-11-08 DIAGNOSIS — S32591D Other specified fracture of right pubis, subsequent encounter for fracture with routine healing: Secondary | ICD-10-CM | POA: Diagnosis not present

## 2021-11-08 DIAGNOSIS — M6281 Muscle weakness (generalized): Secondary | ICD-10-CM | POA: Diagnosis not present

## 2021-11-15 DIAGNOSIS — M6281 Muscle weakness (generalized): Secondary | ICD-10-CM | POA: Diagnosis not present

## 2021-11-15 DIAGNOSIS — S42211D Unspecified displaced fracture of surgical neck of right humerus, subsequent encounter for fracture with routine healing: Secondary | ICD-10-CM | POA: Diagnosis not present

## 2021-11-15 DIAGNOSIS — S32591D Other specified fracture of right pubis, subsequent encounter for fracture with routine healing: Secondary | ICD-10-CM | POA: Diagnosis not present

## 2021-11-19 ENCOUNTER — Encounter (HOSPITAL_COMMUNITY): Payer: Self-pay

## 2021-11-19 ENCOUNTER — Emergency Department (HOSPITAL_COMMUNITY): Payer: PPO

## 2021-11-19 ENCOUNTER — Emergency Department (HOSPITAL_COMMUNITY)
Admission: EM | Admit: 2021-11-19 | Discharge: 2021-11-19 | Disposition: A | Discharge status: Home / Self Care | Payer: PPO | Attending: Emergency Medicine | Admitting: Emergency Medicine

## 2021-11-19 DIAGNOSIS — S42202A Unspecified fracture of upper end of left humerus, initial encounter for closed fracture: Secondary | ICD-10-CM | POA: Insufficient documentation

## 2021-11-19 DIAGNOSIS — E039 Hypothyroidism, unspecified: Secondary | ICD-10-CM | POA: Diagnosis not present

## 2021-11-19 DIAGNOSIS — S70212A Abrasion, left hip, initial encounter: Secondary | ICD-10-CM | POA: Insufficient documentation

## 2021-11-19 DIAGNOSIS — W19XXXA Unspecified fall, initial encounter: Secondary | ICD-10-CM | POA: Diagnosis not present

## 2021-11-19 DIAGNOSIS — R0902 Hypoxemia: Secondary | ICD-10-CM | POA: Diagnosis not present

## 2021-11-19 DIAGNOSIS — S42212A Unspecified displaced fracture of surgical neck of left humerus, initial encounter for closed fracture: Secondary | ICD-10-CM

## 2021-11-19 DIAGNOSIS — W01198A Fall on same level from slipping, tripping and stumbling with subsequent striking against other object, initial encounter: Secondary | ICD-10-CM | POA: Insufficient documentation

## 2021-11-19 DIAGNOSIS — R110 Nausea: Secondary | ICD-10-CM | POA: Diagnosis not present

## 2021-11-19 DIAGNOSIS — Z79899 Other long term (current) drug therapy: Secondary | ICD-10-CM | POA: Insufficient documentation

## 2021-11-19 DIAGNOSIS — S4992XA Unspecified injury of left shoulder and upper arm, initial encounter: Secondary | ICD-10-CM | POA: Diagnosis present

## 2021-11-19 DIAGNOSIS — R42 Dizziness and giddiness: Secondary | ICD-10-CM | POA: Diagnosis not present

## 2021-11-19 LAB — CBC WITH DIFFERENTIAL/PLATELET
Abs Immature Granulocytes: 0.05 10*3/uL (ref 0.00–0.07)
Basophils Absolute: 0 10*3/uL (ref 0.0–0.1)
Basophils Relative: 0 %
Eosinophils Absolute: 0 10*3/uL (ref 0.0–0.5)
Eosinophils Relative: 0 %
HCT: 44.3 % (ref 36.0–46.0)
Hemoglobin: 14.4 g/dL (ref 12.0–15.0)
Immature Granulocytes: 1 %
Lymphocytes Relative: 10 %
Lymphs Abs: 1 10*3/uL (ref 0.7–4.0)
MCH: 31.8 pg (ref 26.0–34.0)
MCHC: 32.5 g/dL (ref 30.0–36.0)
MCV: 97.8 fL (ref 80.0–100.0)
Monocytes Absolute: 0.6 10*3/uL (ref 0.1–1.0)
Monocytes Relative: 6 %
Neutro Abs: 7.9 10*3/uL — ABNORMAL HIGH (ref 1.7–7.7)
Neutrophils Relative %: 83 %
Platelets: 218 10*3/uL (ref 150–400)
RBC: 4.53 MIL/uL (ref 3.87–5.11)
RDW: 14.8 % (ref 11.5–15.5)
WBC: 9.6 10*3/uL (ref 4.0–10.5)
nRBC: 0 % (ref 0.0–0.2)

## 2021-11-19 LAB — BASIC METABOLIC PANEL
Anion gap: 6 (ref 5–15)
BUN: 15 mg/dL (ref 8–23)
CO2: 24 mmol/L (ref 22–32)
Calcium: 8.7 mg/dL — ABNORMAL LOW (ref 8.9–10.3)
Chloride: 106 mmol/L (ref 98–111)
Creatinine, Ser: 1.11 mg/dL — ABNORMAL HIGH (ref 0.44–1.00)
GFR, Estimated: 50 mL/min — ABNORMAL LOW (ref >60)
Glucose, Bld: 106 mg/dL — ABNORMAL HIGH (ref 70–99)
Potassium: 3.6 mmol/L (ref 3.5–5.1)
Sodium: 136 mmol/L (ref 135–145)

## 2021-11-19 MED ORDER — OXYCODONE-ACETAMINOPHEN 5-325 MG PO TABS: 1.0000 | ORAL_TABLET | Freq: Four times a day (QID) | ORAL | 0 refills | Status: DC | PRN

## 2021-11-19 MED ORDER — ONDANSETRON 4 MG PO TBDP
4.0000 mg | ORAL_TABLET | Freq: Once | ORAL | Status: AC
  Administered 2021-11-19: 4 mg via ORAL
  Filled 2021-11-19: qty 1

## 2021-11-19 MED ORDER — ONDANSETRON HCL 4 MG PO TABS: 4.0000 mg | ORAL_TABLET | Freq: Three times a day (TID) | ORAL | 0 refills | Status: DC | PRN

## 2021-11-19 MED ORDER — BACITRACIN ZINC 500 UNIT/GM EX OINT
1.0000 "application " | TOPICAL_OINTMENT | Freq: Once | CUTANEOUS | Status: AC
  Administered 2021-11-19: 1 via TOPICAL
  Filled 2021-11-19: qty 0.9

## 2021-11-19 NOTE — ED Provider Notes (Signed)
[facility] DEPT Provider Note   CSN: [id number] Arrival date & time: 11/19/21  1540     History No chief complaint on file.   Kylie Christensen is a 80 y.o. female.  HPI  Patient presents to the emergency room for evaluation after a fall.  Patient states she was getting up this morning.  She felt a little lightheaded and tried to steady herself.  She thought she might of just stood up too fast.  She went to take a step and lost her balance and ended up falling onto her left shoulder.  Patient denies hitting her head or having any loss of consciousness.  She has not been having trouble with any chest pain or shortness of breath.  She has not had abdominal pain.  No vomiting or diarrhea.  Patient is now having pain primarily in her left shoulder.  It hurts to move her arm.  She did also sustain an abrasion to her left hip area.  She denies any pain in her hip and has no trouble moving her legs.  Past Medical History:  Diagnosis Date  . Anxiety   . Colitis   . GERD (gastroesophageal reflux disease)   . Headache   . Hypothyroidism   . Microscopic colitis     Patient Active Problem List   Diagnosis Date Noted  . Intractable pain 07/08/2021  . Fracture of right inferior pubic ramus, closed, initial encounter ([place]) 07/08/2021  . Fx humeral neck, right, closed, initial encounter 07/08/2021  . Hypothyroidism (acquired) 07/08/2021  . Ambulatory dysfunction 07/08/2021  . Closed displaced comminuted fracture of right patella 03/31/2018  . Distal radius fracture, left 01/21/2016  . Dry eye syndrome 02/16/2013  . Ischemic optic neuropathy of left eye 02/16/2013    Past Surgical History:  Procedure Laterality Date  . ABDOMINAL HYSTERECTOMY     partial  . BACK SURGERY  nov 2011   lower back  . BALLOON DILATION N/A 01/25/2015   Procedure: BALLOON DILATION;  Surgeon: Garlan Fair, MD;  Location: [facility];  Service: Endoscopy;  Laterality: N/A;  .  BIOPSY BREAST Left yrs ago   benign  . COLONOSCOPY WITH PROPOFOL N/A 01/25/2015   Procedure: COLONOSCOPY WITH PROPOFOL;  Surgeon: Garlan Fair, MD;  Location: [facility];  Service: Endoscopy;  Laterality: N/A;  . ESOPHAGOGASTRODUODENOSCOPY (EGD) WITH PROPOFOL N/A 01/25/2015   Procedure: ESOPHAGOGASTRODUODENOSCOPY (EGD) WITH PROPOFOL;  Surgeon: Garlan Fair, MD;  Location: [facility];  Service: Endoscopy;  Laterality: N/A;  . KNEE SURGERY    . OPEN REDUCTION INTERNAL FIXATION (ORIF) DISTAL RADIAL FRACTURE Left 01/21/2016   Procedure: OPEN REDUCTION INTERNAL FIXATION (ORIF) DISTAL RADIAL FRACTURE;  Surgeon: Iran Planas, MD;  Location: [facility];  Service: Orthopedics;  Laterality: Left;  . rotator cuffr Right   . SHOULDER ARTHROSCOPY    . thyroid nodule removed  1970  . TONSILLECTOMY  age 61  . WRIST FRACTURE SURGERY       OB History   No obstetric history on file.     Family History  Problem Relation Age of Onset  . Diabetes Mother   . Other Mother        Zollinger-Ellison syndrome  . Heart attack Father 66       Died of MI at 56 yo.  . Goiter Paternal Grandmother     Social History   Tobacco Use  . Smoking status: Never  . Smokeless tobacco: Never  Vaping Use  . Vaping Use:  Never used  Substance Use Topics  . Alcohol use: Yes    Comment: occasional  . Drug use: Never    Home Medications Prior to Admission medications   Medication Sig Start Date End Date Taking? Authorizing Provider  acetaminophen (TYLENOL) 325 MG tablet Take 2 tablets (650 mg total) by mouth every 4 (four) hours as needed for mild pain, fever or headache. 07/11/21 07/11/22  [place] Donnamarie Poag, DO  B Complex-C (B-COMPLEX WITH VITAMIN C) tablet Take 1 tablet by mouth daily.    [provider]  budesonide (ENTOCORT EC) 3 MG 24 hr capsule Take 9 mg by mouth daily. 06/15/21   [provider]  calcium carbonate (OS-CAL - DOSED IN MG OF ELEMENTAL CALCIUM) 1250 (500 Ca) MG tablet Take 1 tablet  by mouth.    [provider]  cholecalciferol (VITAMIN D) 1000 units tablet Take 1,000 Units by mouth daily.    [provider]  colestipol (COLESTID) 1 g tablet Take 1 g by mouth 2 (two) times daily. 07/31/18   [provider]  docusate sodium (COLACE) 100 MG capsule Take 1 capsule (100 mg total) by mouth 2 (two) times daily. 01/21/16   Iran Planas, MD  levothyroxine (SYNTHROID) 25 MCG tablet Take 25 mcg by mouth daily before breakfast.    [provider]  Multiple Vitamin (MULTIVITAMIN WITH MINERALS) TABS tablet Take 1 tablet by mouth daily.    [provider]  Multiple Vitamins-Minerals (PRESERVISION AREDS 2+MULTI VIT PO) Take 1 Dose by mouth daily.    [provider]  Omega-3 Fatty Acids (FISH OIL PO) Take 1 Dose by mouth daily.    [provider]  Oxycodone HCl 10 MG TABS Take 10 mg by mouth every 6 (six) hours as needed.    [provider]  saccharomyces boulardii (FLORASTOR) 250 MG capsule Take 250 mg by mouth daily.    [provider]  sertraline (ZOLOFT) 50 MG tablet Take 25 mg by mouth daily. 02/18/18   [provider]  traMADol (ULTRAM) 50 MG tablet Take by mouth every 6 (six) hours as needed.    [provider]  zinc oxide 20 % ointment Apply 1 application topically as needed for irritation.    [provider]    Allergies    Amoxicillin  Review of Systems   Review of Systems  All other systems reviewed and are negative.  Physical Exam Updated Vital Signs BP 118/70 (BP Location: Right Arm)   Pulse 73   Temp (!) 97.5 F (36.4 C) (Oral)   Resp 16   SpO2 96%   Physical Exam Vitals and nursing note reviewed.  Constitutional:      General: She is not in acute distress.    Appearance: She is well-developed.  HENT:     Head: Normocephalic and atraumatic.     Right Ear: External ear normal.     Left Ear: External ear normal.  Eyes:     General: No scleral icterus.        Right eye: No discharge.        Left eye: No discharge.     Conjunctiva/sclera: Conjunctivae normal.  Neck:     Trachea: No tracheal deviation.  Cardiovascular:     Rate and Rhythm: Normal rate and regular rhythm.  Pulmonary:     Effort: Pulmonary effort is normal. No respiratory distress.     Breath sounds: Normal breath sounds. No stridor. No wheezing or rales.  Abdominal:  General: Bowel sounds are normal. There is no distension.     Palpations: Abdomen is soft.     Tenderness: There is no abdominal tenderness. There is no guarding or rebound.  Musculoskeletal:        General: Tenderness present. No deformity.     Left shoulder: Tenderness and bony tenderness present. Decreased range of motion.     Left upper arm: Tenderness present.     Left elbow: Normal.     Left wrist: Normal.     Cervical back: Neck supple.     Comments: No test palpation right upper extremity, thoracic cervical or lumbar spine, no tenderness palpation bilateral hips able extend and range legs/ hips without difficulty or pain  Skin:    General: Skin is warm and dry.     Findings: No rash.  Neurological:     General: No focal deficit present.     Mental Status: She is alert.     Cranial Nerves: No cranial nerve deficit (no facial droop, extraocular movements intact, no slurred speech).     Sensory: No sensory deficit.     Motor: No abnormal muscle tone or seizure activity.     Coordination: Coordination normal.  Psychiatric:        Mood and Affect: Mood normal.    ED Results / Procedures / Treatments   Labs (all labs ordered are listed, but only abnormal results are displayed) Labs Reviewed - No data to display  EKG None  Radiology No results found.  Procedures Procedures   Medications Ordered in ED Medications - No data to display  ED Course  I have reviewed the triage vital signs and the nursing notes.  Pertinent labs & imaging results that were available during my care of the  patient were reviewed by me and considered in my medical decision making (see chart for details).  Clinical Course as of 11/19/21 1053  Sun Nov 19, 2021  2536 CBC normal.  Slight increase in creatinine at 1.1 [JK]  1052 Patient was able to walk to the bathroom without difficulty. [JK]    Clinical Course User Index [JK] Dorie Rank, MD   MDM Rules/Calculators/A&P                           Patient presented to the ED for evaluation of a shoulder injury.  Patient became somewhat lightheaded when she was standing up.  Suspect most likely orthostatic etiology.  Patient does not appear dehydrated.  No anemia.  She has a normal heart rhythm.  She is not having any focal neurologic symptoms at this time and is not having any difficulty with walking.  X-ray does show a fracture of the humeral neck.  Patient is in a sling.  Will discharge home with medications for pain outpatient follow-up with her orthopedic doctor. Final Clinical Impression(s) / ED Diagnoses Final diagnoses:  None    Rx / DC Orders ED Discharge Orders     None        Dorie Rank, MD 11/19/21 1053

## 2021-11-19 NOTE — ED Triage Notes (Signed)
Patient coming from friend [facility] home. Patient had unwitnessed fall this morning, Patient hit her left humerus. Denies hit her head and she is not on blood thinners. EMS give Zofran 4 mg and fentanyl 100 mcg.

## 2021-11-19 NOTE — Discharge Instructions (Signed)
Take the medications as needed for pain.  Zofran is for nausea.  Consider taking a stool softener to help prevent constipation from the oxycodone pain medication.  Follow-up with your orthopedic doctor for further evaluation

## 2021-11-20 DIAGNOSIS — M25512 Pain in left shoulder: Secondary | ICD-10-CM | POA: Diagnosis not present

## 2021-11-20 DIAGNOSIS — R2689 Other abnormalities of gait and mobility: Secondary | ICD-10-CM | POA: Diagnosis not present

## 2021-11-20 DIAGNOSIS — S42225A 2-part nondisplaced fracture of surgical neck of left humerus, initial encounter for closed fracture: Secondary | ICD-10-CM | POA: Diagnosis not present

## 2021-11-27 DIAGNOSIS — S42225A 2-part nondisplaced fracture of surgical neck of left humerus, initial encounter for closed fracture: Secondary | ICD-10-CM | POA: Diagnosis not present

## 2021-12-27 DIAGNOSIS — M1712 Unilateral primary osteoarthritis, left knee: Secondary | ICD-10-CM | POA: Diagnosis not present

## 2021-12-27 DIAGNOSIS — S42225D 2-part nondisplaced fracture of surgical neck of left humerus, subsequent encounter for fracture with routine healing: Secondary | ICD-10-CM | POA: Diagnosis not present

## 2021-12-27 DIAGNOSIS — M25562 Pain in left knee: Secondary | ICD-10-CM | POA: Diagnosis not present

## 2022-01-25 ENCOUNTER — Other Ambulatory Visit: Payer: Self-pay | Admitting: Internal Medicine

## 2022-01-25 DIAGNOSIS — M810 Age-related osteoporosis without current pathological fracture: Secondary | ICD-10-CM

## 2022-01-25 DIAGNOSIS — Z9889 Other specified postprocedural states: Secondary | ICD-10-CM | POA: Diagnosis not present

## 2022-01-25 DIAGNOSIS — E042 Nontoxic multinodular goiter: Secondary | ICD-10-CM | POA: Diagnosis not present

## 2022-01-25 DIAGNOSIS — Z1231 Encounter for screening mammogram for malignant neoplasm of breast: Secondary | ICD-10-CM

## 2022-01-25 DIAGNOSIS — Z8781 Personal history of (healed) traumatic fracture: Secondary | ICD-10-CM | POA: Diagnosis not present

## 2022-01-25 DIAGNOSIS — E039 Hypothyroidism, unspecified: Secondary | ICD-10-CM | POA: Diagnosis not present

## 2022-01-25 DIAGNOSIS — R61 Generalized hyperhidrosis: Secondary | ICD-10-CM | POA: Diagnosis not present

## 2022-01-26 DIAGNOSIS — S42212D Unspecified displaced fracture of surgical neck of left humerus, subsequent encounter for fracture with routine healing: Secondary | ICD-10-CM | POA: Diagnosis not present

## 2022-01-29 DIAGNOSIS — R2689 Other abnormalities of gait and mobility: Secondary | ICD-10-CM | POA: Diagnosis not present

## 2022-01-29 DIAGNOSIS — M179 Osteoarthritis of knee, unspecified: Secondary | ICD-10-CM | POA: Diagnosis not present

## 2022-01-29 DIAGNOSIS — M25562 Pain in left knee: Secondary | ICD-10-CM | POA: Diagnosis not present

## 2022-01-29 DIAGNOSIS — R2681 Unsteadiness on feet: Secondary | ICD-10-CM | POA: Diagnosis not present

## 2022-01-29 DIAGNOSIS — M6281 Muscle weakness (generalized): Secondary | ICD-10-CM | POA: Diagnosis not present

## 2022-01-29 DIAGNOSIS — F419 Anxiety disorder, unspecified: Secondary | ICD-10-CM | POA: Diagnosis not present

## 2022-01-29 DIAGNOSIS — Z9181 History of falling: Secondary | ICD-10-CM | POA: Diagnosis not present

## 2022-01-29 DIAGNOSIS — S42212D Unspecified displaced fracture of surgical neck of left humerus, subsequent encounter for fracture with routine healing: Secondary | ICD-10-CM | POA: Diagnosis not present

## 2022-01-30 DIAGNOSIS — E039 Hypothyroidism, unspecified: Secondary | ICD-10-CM | POA: Diagnosis not present

## 2022-01-30 DIAGNOSIS — H9313 Tinnitus, bilateral: Secondary | ICD-10-CM | POA: Diagnosis not present

## 2022-01-30 DIAGNOSIS — R42 Dizziness and giddiness: Secondary | ICD-10-CM | POA: Diagnosis not present

## 2022-02-01 DIAGNOSIS — M179 Osteoarthritis of knee, unspecified: Secondary | ICD-10-CM | POA: Diagnosis not present

## 2022-02-01 DIAGNOSIS — M25562 Pain in left knee: Secondary | ICD-10-CM | POA: Diagnosis not present

## 2022-02-01 DIAGNOSIS — R2689 Other abnormalities of gait and mobility: Secondary | ICD-10-CM | POA: Diagnosis not present

## 2022-02-01 DIAGNOSIS — F419 Anxiety disorder, unspecified: Secondary | ICD-10-CM | POA: Diagnosis not present

## 2022-02-01 DIAGNOSIS — R2681 Unsteadiness on feet: Secondary | ICD-10-CM | POA: Diagnosis not present

## 2022-02-01 DIAGNOSIS — S42212D Unspecified displaced fracture of surgical neck of left humerus, subsequent encounter for fracture with routine healing: Secondary | ICD-10-CM | POA: Diagnosis not present

## 2022-02-01 DIAGNOSIS — M6281 Muscle weakness (generalized): Secondary | ICD-10-CM | POA: Diagnosis not present

## 2022-02-01 DIAGNOSIS — Z9181 History of falling: Secondary | ICD-10-CM | POA: Diagnosis not present

## 2022-02-05 DIAGNOSIS — F419 Anxiety disorder, unspecified: Secondary | ICD-10-CM | POA: Diagnosis not present

## 2022-02-05 DIAGNOSIS — S42212D Unspecified displaced fracture of surgical neck of left humerus, subsequent encounter for fracture with routine healing: Secondary | ICD-10-CM | POA: Diagnosis not present

## 2022-02-05 DIAGNOSIS — M179 Osteoarthritis of knee, unspecified: Secondary | ICD-10-CM | POA: Diagnosis not present

## 2022-02-05 DIAGNOSIS — M6281 Muscle weakness (generalized): Secondary | ICD-10-CM | POA: Diagnosis not present

## 2022-02-05 DIAGNOSIS — R2689 Other abnormalities of gait and mobility: Secondary | ICD-10-CM | POA: Diagnosis not present

## 2022-02-05 DIAGNOSIS — M25562 Pain in left knee: Secondary | ICD-10-CM | POA: Diagnosis not present

## 2022-02-05 DIAGNOSIS — Z9181 History of falling: Secondary | ICD-10-CM | POA: Diagnosis not present

## 2022-02-05 DIAGNOSIS — R2681 Unsteadiness on feet: Secondary | ICD-10-CM | POA: Diagnosis not present

## 2022-02-06 DIAGNOSIS — R2681 Unsteadiness on feet: Secondary | ICD-10-CM | POA: Diagnosis not present

## 2022-02-06 DIAGNOSIS — M6281 Muscle weakness (generalized): Secondary | ICD-10-CM | POA: Diagnosis not present

## 2022-02-06 DIAGNOSIS — M25562 Pain in left knee: Secondary | ICD-10-CM | POA: Diagnosis not present

## 2022-02-06 DIAGNOSIS — F419 Anxiety disorder, unspecified: Secondary | ICD-10-CM | POA: Diagnosis not present

## 2022-02-06 DIAGNOSIS — R2689 Other abnormalities of gait and mobility: Secondary | ICD-10-CM | POA: Diagnosis not present

## 2022-02-06 DIAGNOSIS — S42212D Unspecified displaced fracture of surgical neck of left humerus, subsequent encounter for fracture with routine healing: Secondary | ICD-10-CM | POA: Diagnosis not present

## 2022-02-06 DIAGNOSIS — M179 Osteoarthritis of knee, unspecified: Secondary | ICD-10-CM | POA: Diagnosis not present

## 2022-02-06 DIAGNOSIS — Z9181 History of falling: Secondary | ICD-10-CM | POA: Diagnosis not present

## 2022-02-09 DIAGNOSIS — M25562 Pain in left knee: Secondary | ICD-10-CM | POA: Diagnosis not present

## 2022-02-09 DIAGNOSIS — R2681 Unsteadiness on feet: Secondary | ICD-10-CM | POA: Diagnosis not present

## 2022-02-09 DIAGNOSIS — M179 Osteoarthritis of knee, unspecified: Secondary | ICD-10-CM | POA: Diagnosis not present

## 2022-02-09 DIAGNOSIS — F419 Anxiety disorder, unspecified: Secondary | ICD-10-CM | POA: Diagnosis not present

## 2022-02-09 DIAGNOSIS — S42212D Unspecified displaced fracture of surgical neck of left humerus, subsequent encounter for fracture with routine healing: Secondary | ICD-10-CM | POA: Diagnosis not present

## 2022-02-09 DIAGNOSIS — R2689 Other abnormalities of gait and mobility: Secondary | ICD-10-CM | POA: Diagnosis not present

## 2022-02-09 DIAGNOSIS — Z9181 History of falling: Secondary | ICD-10-CM | POA: Diagnosis not present

## 2022-02-09 DIAGNOSIS — M6281 Muscle weakness (generalized): Secondary | ICD-10-CM | POA: Diagnosis not present

## 2022-02-12 DIAGNOSIS — M6281 Muscle weakness (generalized): Secondary | ICD-10-CM | POA: Diagnosis not present

## 2022-02-12 DIAGNOSIS — M25562 Pain in left knee: Secondary | ICD-10-CM | POA: Diagnosis not present

## 2022-02-12 DIAGNOSIS — S42212D Unspecified displaced fracture of surgical neck of left humerus, subsequent encounter for fracture with routine healing: Secondary | ICD-10-CM | POA: Diagnosis not present

## 2022-02-12 DIAGNOSIS — R2689 Other abnormalities of gait and mobility: Secondary | ICD-10-CM | POA: Diagnosis not present

## 2022-02-12 DIAGNOSIS — M179 Osteoarthritis of knee, unspecified: Secondary | ICD-10-CM | POA: Diagnosis not present

## 2022-02-12 DIAGNOSIS — F419 Anxiety disorder, unspecified: Secondary | ICD-10-CM | POA: Diagnosis not present

## 2022-02-12 DIAGNOSIS — R2681 Unsteadiness on feet: Secondary | ICD-10-CM | POA: Diagnosis not present

## 2022-02-12 DIAGNOSIS — Z9181 History of falling: Secondary | ICD-10-CM | POA: Diagnosis not present

## 2022-02-15 DIAGNOSIS — S42212D Unspecified displaced fracture of surgical neck of left humerus, subsequent encounter for fracture with routine healing: Secondary | ICD-10-CM | POA: Diagnosis not present

## 2022-02-15 DIAGNOSIS — M6281 Muscle weakness (generalized): Secondary | ICD-10-CM | POA: Diagnosis not present

## 2022-02-19 DIAGNOSIS — S42212D Unspecified displaced fracture of surgical neck of left humerus, subsequent encounter for fracture with routine healing: Secondary | ICD-10-CM | POA: Diagnosis not present

## 2022-02-19 DIAGNOSIS — M6281 Muscle weakness (generalized): Secondary | ICD-10-CM | POA: Diagnosis not present

## 2022-02-22 DIAGNOSIS — M6281 Muscle weakness (generalized): Secondary | ICD-10-CM | POA: Diagnosis not present

## 2022-02-22 DIAGNOSIS — S42212D Unspecified displaced fracture of surgical neck of left humerus, subsequent encounter for fracture with routine healing: Secondary | ICD-10-CM | POA: Diagnosis not present

## 2022-02-26 DIAGNOSIS — M6281 Muscle weakness (generalized): Secondary | ICD-10-CM | POA: Diagnosis not present

## 2022-02-26 DIAGNOSIS — S42212D Unspecified displaced fracture of surgical neck of left humerus, subsequent encounter for fracture with routine healing: Secondary | ICD-10-CM | POA: Diagnosis not present

## 2022-03-01 DIAGNOSIS — S42212D Unspecified displaced fracture of surgical neck of left humerus, subsequent encounter for fracture with routine healing: Secondary | ICD-10-CM | POA: Diagnosis not present

## 2022-03-01 DIAGNOSIS — M6281 Muscle weakness (generalized): Secondary | ICD-10-CM | POA: Diagnosis not present

## 2022-03-05 DIAGNOSIS — M6281 Muscle weakness (generalized): Secondary | ICD-10-CM | POA: Diagnosis not present

## 2022-03-05 DIAGNOSIS — S42212D Unspecified displaced fracture of surgical neck of left humerus, subsequent encounter for fracture with routine healing: Secondary | ICD-10-CM | POA: Diagnosis not present

## 2022-03-12 DIAGNOSIS — S42212D Unspecified displaced fracture of surgical neck of left humerus, subsequent encounter for fracture with routine healing: Secondary | ICD-10-CM | POA: Diagnosis not present

## 2022-03-12 DIAGNOSIS — M6281 Muscle weakness (generalized): Secondary | ICD-10-CM | POA: Diagnosis not present

## 2022-03-13 DIAGNOSIS — K529 Noninfective gastroenteritis and colitis, unspecified: Secondary | ICD-10-CM | POA: Diagnosis not present

## 2022-03-13 DIAGNOSIS — K52831 Collagenous colitis: Secondary | ICD-10-CM | POA: Diagnosis not present

## 2022-03-15 DIAGNOSIS — M6281 Muscle weakness (generalized): Secondary | ICD-10-CM | POA: Diagnosis not present

## 2022-03-15 DIAGNOSIS — S42212D Unspecified displaced fracture of surgical neck of left humerus, subsequent encounter for fracture with routine healing: Secondary | ICD-10-CM | POA: Diagnosis not present

## 2022-03-19 DIAGNOSIS — K52831 Collagenous colitis: Secondary | ICD-10-CM | POA: Diagnosis not present

## 2022-03-19 DIAGNOSIS — E042 Nontoxic multinodular goiter: Secondary | ICD-10-CM | POA: Diagnosis not present

## 2022-03-19 DIAGNOSIS — M810 Age-related osteoporosis without current pathological fracture: Secondary | ICD-10-CM | POA: Diagnosis not present

## 2022-03-19 DIAGNOSIS — E039 Hypothyroidism, unspecified: Secondary | ICD-10-CM | POA: Diagnosis not present

## 2022-03-19 DIAGNOSIS — K219 Gastro-esophageal reflux disease without esophagitis: Secondary | ICD-10-CM | POA: Diagnosis not present

## 2022-03-19 DIAGNOSIS — G4700 Insomnia, unspecified: Secondary | ICD-10-CM | POA: Diagnosis not present

## 2022-03-19 DIAGNOSIS — Z0000 Encounter for general adult medical examination without abnormal findings: Secondary | ICD-10-CM | POA: Diagnosis not present

## 2022-03-19 DIAGNOSIS — F322 Major depressive disorder, single episode, severe without psychotic features: Secondary | ICD-10-CM | POA: Diagnosis not present

## 2022-03-19 DIAGNOSIS — E782 Mixed hyperlipidemia: Secondary | ICD-10-CM | POA: Diagnosis not present

## 2022-03-19 DIAGNOSIS — G43009 Migraine without aura, not intractable, without status migrainosus: Secondary | ICD-10-CM | POA: Diagnosis not present

## 2022-04-12 DIAGNOSIS — R42 Dizziness and giddiness: Secondary | ICD-10-CM | POA: Diagnosis not present

## 2022-04-12 DIAGNOSIS — H903 Sensorineural hearing loss, bilateral: Secondary | ICD-10-CM | POA: Diagnosis not present

## 2022-04-23 DIAGNOSIS — R42 Dizziness and giddiness: Secondary | ICD-10-CM | POA: Diagnosis not present

## 2022-04-23 DIAGNOSIS — R2689 Other abnormalities of gait and mobility: Secondary | ICD-10-CM | POA: Diagnosis not present

## 2022-04-23 DIAGNOSIS — R2681 Unsteadiness on feet: Secondary | ICD-10-CM | POA: Diagnosis not present

## 2022-05-16 ENCOUNTER — Other Ambulatory Visit: Payer: Self-pay | Admitting: *Deleted

## 2022-05-16 ENCOUNTER — Encounter: Payer: Self-pay | Admitting: *Deleted

## 2022-05-21 DIAGNOSIS — S52502A Unspecified fracture of the lower end of left radius, initial encounter for closed fracture: Secondary | ICD-10-CM | POA: Diagnosis not present

## 2022-05-21 DIAGNOSIS — M25531 Pain in right wrist: Secondary | ICD-10-CM | POA: Diagnosis not present

## 2022-05-21 NOTE — Progress Notes (Unsigned)
[facility]  PATIENT: Kylie Christensen DOB: [birth year]  REFERRING CLINICIAN: Melida Quitter, MD HISTORY FROM: self, husband Ed REASON FOR VISIT: vertigo, imbalance, hearing loss   HISTORICAL  CHIEF COMPLAINT:  Chief Complaint  Patient presents with   Balance, vertigo    Rm 8 New Pt husband- Ed  "multiple falls and fractures, intermittent ringing in my ears, feel unsteady"    HISTORY OF PRESENT ILLNESS:  The patient presents for evaluation of vertigo and multiple falls.   2016: tripped over shoes, fell and broke her knee cap 2017: tripped over her own feet, fell and broke her wrist 2019: tripped in new shoes, fell and broke her knee July 2022: Developed vertigo (room spinning), tripped over blankets fell and broke her shoulder and pelvis. Took meclizine and vertigo improved after 2 weeks. December 2022: fell while bending down to pick up a ball, broke her left shoulder June 2023: stepped in a hole while leaning forward, fell and broke her wrist  She has had some residual unsteadiness since her episode of vertigo in July 2022. Has also noticed progressive hearing loss and tinnitus bilaterally. Does not believe her falls in December and were associated with vertigo. She has been working with PT for balance. Saw ENT 04/12/22 who referred to neurology to assess for neurologic cause for her imbalance.   She has residual left knee pain from one of her falls. Has a history of lumbar fusion and denies leg weakness or radicular pain in her lower back. She has pins and needles sensations in her calves at night. Takes gabapentin occasionally for this.   She also reports bad headaches which can last 2-3 days at a time. They initially improved after menopause but have been worsening lately. Dizziness does not seem associated with these headaches.  Has also noticed progressive short term memory loss. She is having trouble remembering names and conversations.  She has a  history of ischemic optic neuropathy and has limited vision in the upper portion of her left eye. She has started to see splotches and webs in her vision.   OTHER MEDICAL CONDITIONS: hypothyroidism, ischemic optic neuropathy left eye, microscopic colitis, s/p lumbar fusion   REVIEW OF SYSTEMS: Full 14 system review of systems performed and negative with exception of: imbalance, falls, headaches, hearing loss  ALLERGIES: Allergies  Allergen Reactions   Nsaids     Upsets stomach   Amoxicillin Diarrhea and Rash    HOME MEDICATIONS: Outpatient Medications Prior to Visit  Medication Sig Dispense Refill   acetaminophen (TYLENOL) 325 MG tablet Take 2 tablets (650 mg total) by mouth every 4 (four) hours as needed for mild pain, fever or headache. 100 tablet 2   B Complex-C (B-COMPLEX WITH VITAMIN C) tablet Take 1 tablet by mouth daily.     budesonide (ENTOCORT EC) 3 MG 24 hr capsule Take 9 mg by mouth daily.     calcium carbonate (OS-CAL - DOSED IN MG OF ELEMENTAL CALCIUM) 1250 (500 Ca) MG tablet Take 1 tablet by mouth.     cholecalciferol (VITAMIN D) 1000 units tablet Take 1,000 Units by mouth daily.     colestipol (COLESTID) 1 g tablet Take 1 g by mouth 2 (two) times daily.  1   levothyroxine (SYNTHROID) 25 MCG tablet Take 25 mcg by mouth daily before breakfast.     Multiple Vitamin (MULTIVITAMIN WITH MINERALS) TABS tablet Take 1 tablet by mouth daily.     Multiple Vitamins-Minerals (PRESERVISION AREDS 2+MULTI VIT PO) Take  1 Dose by mouth daily.     Omega-3 Fatty Acids (FISH OIL PO) Take 1 Dose by mouth daily.     oxyCODONE-acetaminophen (PERCOCET/ROXICET) 5-325 MG tablet Take 1 tablet by mouth every 6 (six) hours as needed for severe pain. 15 tablet 0   sertraline (ZOLOFT) 25 MG tablet TAKE 1 TABLET BY MOUTH BY MOUTH ONCE DAILY     cephALEXin (KEFLEX) 500 MG capsule Take 500 mg by mouth 4 (four) times daily. Start with surgery 05/24/22 (Patient not taking: Reported on 05/22/2022)     docusate  sodium (COLACE) 100 MG capsule Take 1 capsule (100 mg total) by mouth 2 (two) times daily. (Patient not taking: Reported on 05/22/2022) 30 capsule 0   ondansetron (ZOFRAN-ODT) 8 MG disintegrating tablet Take 8 mg by mouth 3 (three) times daily. Start with surgery 05/24/22 (Patient not taking: Reported on 05/22/2022)     Oxycodone HCl 10 MG TABS Take 10 mg by mouth every 6 (six) hours as needed. (Patient not taking: Reported on 05/22/2022)     saccharomyces boulardii (FLORASTOR) 250 MG capsule Take 250 mg by mouth daily. (Patient not taking: Reported on 05/22/2022)     traMADol (ULTRAM) 50 MG tablet Take by mouth every 6 (six) hours as needed. (Patient not taking: Reported on 05/22/2022)     zinc oxide 20 % ointment Apply 1 application topically as needed for irritation. (Patient not taking: Reported on 05/22/2022)     ondansetron (ZOFRAN) 4 MG tablet Take 1 tablet (4 mg total) by mouth every 8 (eight) hours as needed for nausea or vomiting. (Patient not taking: Reported on 05/22/2022) 12 tablet 0   No facility-administered medications prior to visit.    PAST MEDICAL HISTORY: Past Medical History:  Diagnosis Date   Anxiety    Cataract    Colitis    Dizziness    GERD (gastroesophageal reflux disease)    Glaucoma    Headache    Hearing aid worn    Hypothyroidism    Microscopic colitis     PAST SURGICAL HISTORY: Past Surgical History:  Procedure Laterality Date   ABDOMINAL HYSTERECTOMY     partial   BACK SURGERY  10/2010   lower back   BALLOON DILATION N/A 01/25/2015   Procedure: BALLOON DILATION;  Surgeon: Garlan Fair, MD;  Location: [facility];  Service: Endoscopy;  Laterality: N/A;   BIOPSY BREAST Left yrs ago   benign   CATARACT EXTRACTION     COLONOSCOPY WITH PROPOFOL N/A 01/25/2015   Procedure: COLONOSCOPY WITH PROPOFOL;  Surgeon: Garlan Fair, MD;  Location: [facility];  Service: Endoscopy;  Laterality: N/A;   ESOPHAGOGASTRODUODENOSCOPY (EGD) WITH PROPOFOL N/A 01/25/2015    Procedure: ESOPHAGOGASTRODUODENOSCOPY (EGD) WITH PROPOFOL;  Surgeon: Garlan Fair, MD;  Location: [facility];  Service: Endoscopy;  Laterality: N/A;   KNEE SURGERY     OPEN REDUCTION INTERNAL FIXATION (ORIF) DISTAL RADIAL FRACTURE Left 01/21/2016   Procedure: OPEN REDUCTION INTERNAL FIXATION (ORIF) DISTAL RADIAL FRACTURE;  Surgeon: Iran Planas, MD;  Location: [facility];  Service: Orthopedics;  Laterality: Left;   rotator cuffr Right    SHOULDER ARTHROSCOPY     thyroid nodule removed  1970   TONSILLECTOMY  age 10   WISDOM TOOTH EXTRACTION     WRIST FRACTURE SURGERY      FAMILY HISTORY: Family History  Problem Relation Age of Onset   Diabetes Mother    Other Mother        Zollinger-Ellison syndrome   Heart  attack Father 26       Died of MI at 89 yo.   Goiter Paternal Grandmother     SOCIAL HISTORY: Social History   Socioeconomic History   Marital status: Married    Spouse name: Ed   Number of children: 2   Years of education: Not on file   Highest education level: Some college, no degree  Occupational History   Not on file  Tobacco Use   Smoking status: Never   Smokeless tobacco: Never  Vaping Use   Vaping Use: Never used  Substance and Sexual Activity   Alcohol use: Yes    Comment: occasional   Drug use: Never   Sexual activity: Not on file  Other Topics Concern   Not on file  Social History Narrative   05/22/22 Lives with husband   Social Determinants of Health   Financial Resource Strain: Not on file  Food Insecurity: Not on file  Transportation Needs: Not on file  Physical Activity: Not on file  Stress: Not on file  Social Connections: Not on file  Intimate Partner Violence: Not on file     PHYSICAL EXAM  GENERAL EXAM/CONSTITUTIONAL: Vitals:  Vitals:   05/22/22 1116  BP: 105/64  Pulse: 74  Weight: 170 lb 12.8 oz (77.5 kg)  Height: '5\' 3"'$  (1.6 m)   Body mass index is 30.26 kg/m. Wt Readings from Last 3 Encounters:  05/22/22 170 lb 12.8 oz  (77.5 kg)  08/03/21 168 lb 14.4 oz (76.6 kg)  07/13/21 176 lb (79.8 kg)   Patient is in no distress; well developed, nourished and groomed; neck is supple  NEUROLOGIC: MENTAL STATUS:  awake, alert, oriented to person, place and time recent and remote memory intact normal attention and concentration   CRANIAL NERVE:  2nd, 3rd, 4th, 6th - pupils equal and reactive to light, visual fields full to confrontation, extraocular muscles intact, no nystagmus 5th - facial sensation symmetric 7th - facial strength symmetric 8th - hearing intact 9th - palate elevates symmetrically, uvula midline 11th - shoulder shrug symmetric 12th - tongue protrusion midline  MOTOR:  normal bulk and tone, full strength in the LUE, LLE, RUE exam deferred due to arm fracture  SENSORY:  Diminished sensation to light touch over LUE (currently in cast), sensation intact to pinprick and vibration all other extremities  COORDINATION:  finger-nose-finger, fine finger movements normal  REFLEXES:  deep tendon reflexes present and symmetric  GAIT/STATION:  normal   DIAGNOSTIC DATA (LABS, IMAGING, TESTING) - I reviewed patient records, labs, notes, testing and imaging myself where available.  Lab Results  Component Value Date   WBC 9.6 11/19/2021   HGB 14.4 11/19/2021   HCT 44.3 11/19/2021   MCV 97.8 11/19/2021   PLT 218 11/19/2021      Component Value Date/Time   NA 136 11/19/2021 0839   K 3.6 11/19/2021 0839   CL 106 11/19/2021 0839   CO2 24 11/19/2021 0839   GLUCOSE 106 (H) 11/19/2021 0839   BUN 15 11/19/2021 0839   CREATININE 1.11 (H) 11/19/2021 0839   CALCIUM 8.7 (L) 11/19/2021 0839   GFRNONAA 50 (L) 11/19/2021 0839   GFRAA 53 (L) 01/21/2016 1600     ASSESSMENT AND PLAN  81 y.o. year old female with a history of hypothyroidism, ischemic optic neuropathy left eye, microscopic colitis, s/p lumbar fusion who presents for evaluation of imbalance and falls. Will order MRI brain as she also  reports worsening headaches, memory changes, and progressive hearing loss.  She also endorses bilateral leg paresthesias. No clear sensory deficit on exam, though small fiber neuropathy may present without numbness. States she had blood work done recently. Will have PCP fax these results over to see if neuropathy labs have been tested.   1. Imbalance   2. Memory loss       PLAN: -MRI brain -Neuropathy labs (A1c, TSH, B12, myeloma panel). She will have PCP fax recent blood work to see if this has already been completed  Orders Placed This Encounter  Procedures   MR BRAIN WO CONTRAST    No orders of the defined types were placed in this encounter.   Return in about 6 months (around 11/21/2022).    Genia Harold, MD 05/22/22 12:36 PM  I spent an average of 65 minutes chart reviewing and counseling the patient, with at least 50% of the time face to face with the patient.   [facility] [address] [place], [place] [address] [phone]

## 2022-05-22 ENCOUNTER — Ambulatory Visit (INDEPENDENT_AMBULATORY_CARE_PROVIDER_SITE_OTHER): Payer: Medicare HMO | Admitting: Psychiatry

## 2022-05-22 ENCOUNTER — Encounter: Payer: Self-pay | Admitting: Psychiatry

## 2022-05-22 VITALS — BP 105/64 | HR 74 | Ht 63.0 in | Wt 170.8 lb

## 2022-05-22 DIAGNOSIS — R413 Other amnesia: Secondary | ICD-10-CM

## 2022-05-22 DIAGNOSIS — R2689 Other abnormalities of gait and mobility: Secondary | ICD-10-CM | POA: Diagnosis not present

## 2022-05-22 NOTE — Patient Instructions (Addendum)
Plan:  Please have recent blood work results sent to my office. (A1c, TSH, B12 level) Brain MRI   Preventing Falls at [facility] are common, often dreaded events in the lives of older people. Aside from the obvious injuries and even death that may result, fall can cause wide-ranging consequences including loss of independence, mental decline, decreased activity and mobility. Younger people are also at risk of falling, especially those with chronic illnesses and fatigue.  Ways to reduce risk for falling   Examine diet and medications. Warm foods and alcohol dilate blood vessels, which can lead to dizziness when standing. Sleep aids, antidepressants and pain medications can also increase the likelihood of a fall.   Get a vision exam. Poor vision, cataracts and glaucoma increase the chances of falling.   Check foot gear. Shoes should fit snugly and have a sturdy, nonskid sole and a broad, low heel   Participate in a physician-approved exercise program to build and maintain muscle strength and improve balance and coordination. Programs that use ankle weights or stretch bands are excellent for muscle-strengthening. Water aerobics programs and low-impact Tai Chi programs have also been shown to improve balance and coordination.   Increase vitamin D intake. Vitamin D improves muscle strength and increases the amount of calcium the body is able to absorb and deposit in bones.  How to prevent falls from common hazards   Floors -- Remove all loose wires, cords, and throw rugs. Minimize clutter. Make sure rugs are anchored and smooth. Keep furniture in its usual place.   Chairs -- Use chairs with straight backs, armrests and firm seats. Add firm cushions to existing pieces to add height.   Bathroom -- Install grab bars and non-skid tape in the tub or shower. Use a bathtub transfer bench or a shower chair with a back support Use an elevated toilet seat and/or safety rails to assist standing from a low  surface. Do not use towel racks or bathroom tissue holders to help you stand.   Lighting -- Make sure halls, stairways, and entrances are well-lit. Install a night light in your bathroom or hallway. Make sure there is a light switch at the top and bottom of the staircase. Turn lights on if you get up in the middle of the night. Make sure lamps or light switches are within reach of the bed if you have to get up during the night.   Kitchen -- Install non-skid rubber mats near the sink and stove. Clean spills immediately. Store frequently used utensils, pots, pans between waist and eye level. This helps prevent reaching and bending. Sit when getting things out of lower cupboards.   Living room / [place]yl[place] furniture with wide spaces in between, giving enough room to move around. Establish a route through the living room that gives you something to hold onto as you walk.   Stairs -- Make sure treads, rails, and rugs are secure. Install a rail on both sides of the stairs. If stairs are a threat, it might be helpful to arrange most of your activities on the lower level to reduce the number of times you must climb the stairs.   Entrances and doorways -- Install metal handles on the walls adjacent to the doorknobs of all doors to make it more secure as you travel through the doorway.   Tips for maintaining balance   Keep at least one hand free at all times. Try using a backpack or fanny pack to hold things  rather than carrying them in your hands. Never carry objects in both hands when walking as this interferes with keeping your balance.   Attempt to swing both arms from front to back while walking.   Consciously lift your feet off of the ground when walking. Shuffling and dragging of the feet is a common culprit in losing your balance.   When trying to navigate turns, use a "U" technique of facing forward and making a wide turn, rather than pivoting sharply.   Try to stand with your feet shoulder-length  apart. When your feet are close together for any length of time, you increase your risk of losing your balance and falling.   Do one thing at a time. Don't try to walk and accomplish another task, such as reading or looking around. The decrease in your automatic reflexes complicates motor function, so the less distraction, the better.   Do not wear rubber or gripping soled shoes, they might "catch" on the floor and cause tripping.   Move slowly when changing positions. Use deliberate, concentrated movements and, if needed, use a grab bar or walking aid. Count 15 seconds between each movement. For example, when rising from a seated position, wait 15 seconds after standing to begin walking.   If balance is a continuous problem, you might want to consider a walking aid such as a cane, walking stick, or walker. Once you've mastered walking with help, you might be ready to try it on your own again.

## 2022-05-23 ENCOUNTER — Telehealth: Payer: Self-pay | Admitting: Psychiatry

## 2022-05-23 NOTE — Telephone Encounter (Signed)
HT[employer] Auth: [id number] exp. 05/23/22-06/22/22 sent to GI

## 2022-05-24 ENCOUNTER — Encounter: Payer: Self-pay | Admitting: Psychiatry

## 2022-05-24 ENCOUNTER — Other Ambulatory Visit: Payer: Self-pay | Admitting: Psychiatry

## 2022-05-24 DIAGNOSIS — G629 Polyneuropathy, unspecified: Secondary | ICD-10-CM

## 2022-05-25 DIAGNOSIS — S52511A Displaced fracture of right radial styloid process, initial encounter for closed fracture: Secondary | ICD-10-CM | POA: Diagnosis not present

## 2022-05-25 DIAGNOSIS — S52571A Other intraarticular fracture of lower end of right radius, initial encounter for closed fracture: Secondary | ICD-10-CM | POA: Diagnosis not present

## 2022-05-28 DIAGNOSIS — Z4789 Encounter for other orthopedic aftercare: Secondary | ICD-10-CM | POA: Diagnosis not present

## 2022-06-06 DIAGNOSIS — Z4789 Encounter for other orthopedic aftercare: Secondary | ICD-10-CM | POA: Diagnosis not present

## 2022-06-14 DIAGNOSIS — H524 Presbyopia: Secondary | ICD-10-CM | POA: Diagnosis not present

## 2022-06-14 DIAGNOSIS — H5310 Unspecified subjective visual disturbances: Secondary | ICD-10-CM | POA: Diagnosis not present

## 2022-06-14 DIAGNOSIS — Z961 Presence of intraocular lens: Secondary | ICD-10-CM | POA: Diagnosis not present

## 2022-06-25 DIAGNOSIS — Z4789 Encounter for other orthopedic aftercare: Secondary | ICD-10-CM | POA: Diagnosis not present

## 2022-06-25 DIAGNOSIS — M25531 Pain in right wrist: Secondary | ICD-10-CM | POA: Diagnosis not present

## 2022-06-28 ENCOUNTER — Encounter (HOSPITAL_BASED_OUTPATIENT_CLINIC_OR_DEPARTMENT_OTHER): Payer: Self-pay

## 2022-06-28 ENCOUNTER — Other Ambulatory Visit (HOSPITAL_BASED_OUTPATIENT_CLINIC_OR_DEPARTMENT_OTHER): Payer: Self-pay | Admitting: Radiology

## 2022-06-28 ENCOUNTER — Emergency Department (HOSPITAL_BASED_OUTPATIENT_CLINIC_OR_DEPARTMENT_OTHER): Payer: Medicare HMO

## 2022-06-28 ENCOUNTER — Emergency Department (HOSPITAL_BASED_OUTPATIENT_CLINIC_OR_DEPARTMENT_OTHER)
Admission: EM | Admit: 2022-06-28 | Discharge: 2022-06-28 | Disposition: A | Discharge status: Home / Self Care | Payer: Medicare HMO | Attending: Emergency Medicine | Admitting: Emergency Medicine

## 2022-06-28 ENCOUNTER — Emergency Department (HOSPITAL_BASED_OUTPATIENT_CLINIC_OR_DEPARTMENT_OTHER): Payer: Medicare HMO | Admitting: Radiology

## 2022-06-28 ENCOUNTER — Other Ambulatory Visit: Payer: Self-pay

## 2022-06-28 DIAGNOSIS — W010XXA Fall on same level from slipping, tripping and stumbling without subsequent striking against object, initial encounter: Secondary | ICD-10-CM | POA: Diagnosis not present

## 2022-06-28 DIAGNOSIS — Z043 Encounter for examination and observation following other accident: Secondary | ICD-10-CM | POA: Diagnosis not present

## 2022-06-28 DIAGNOSIS — S0990XA Unspecified injury of head, initial encounter: Secondary | ICD-10-CM | POA: Diagnosis not present

## 2022-06-28 DIAGNOSIS — M79642 Pain in left hand: Secondary | ICD-10-CM | POA: Diagnosis not present

## 2022-06-28 DIAGNOSIS — S63642A Sprain of metacarpophalangeal joint of left thumb, initial encounter: Secondary | ICD-10-CM

## 2022-06-28 DIAGNOSIS — S62646A Nondisplaced fracture of proximal phalanx of right little finger, initial encounter for closed fracture: Secondary | ICD-10-CM | POA: Diagnosis not present

## 2022-06-28 DIAGNOSIS — S62614A Displaced fracture of proximal phalanx of right ring finger, initial encounter for closed fracture: Secondary | ICD-10-CM | POA: Diagnosis not present

## 2022-06-28 DIAGNOSIS — S62346A Nondisplaced fracture of base of fifth metacarpal bone, right hand, initial encounter for closed fracture: Secondary | ICD-10-CM | POA: Diagnosis not present

## 2022-06-28 DIAGNOSIS — S6991XA Unspecified injury of right wrist, hand and finger(s), initial encounter: Secondary | ICD-10-CM | POA: Diagnosis present

## 2022-06-28 DIAGNOSIS — R519 Headache, unspecified: Secondary | ICD-10-CM | POA: Insufficient documentation

## 2022-06-28 DIAGNOSIS — S62306A Unspecified fracture of fifth metacarpal bone, right hand, initial encounter for closed fracture: Secondary | ICD-10-CM

## 2022-06-28 DIAGNOSIS — T07XXXA Unspecified multiple injuries, initial encounter: Secondary | ICD-10-CM | POA: Diagnosis not present

## 2022-06-28 DIAGNOSIS — M7989 Other specified soft tissue disorders: Secondary | ICD-10-CM | POA: Diagnosis not present

## 2022-06-28 DIAGNOSIS — S01511A Laceration without foreign body of lip, initial encounter: Secondary | ICD-10-CM

## 2022-06-28 DIAGNOSIS — S01111A Laceration without foreign body of right eyelid and periocular area, initial encounter: Secondary | ICD-10-CM | POA: Diagnosis not present

## 2022-06-28 DIAGNOSIS — W19XXXA Unspecified fall, initial encounter: Secondary | ICD-10-CM | POA: Diagnosis not present

## 2022-06-28 DIAGNOSIS — S0181XA Laceration without foreign body of other part of head, initial encounter: Secondary | ICD-10-CM

## 2022-06-28 MED ORDER — OXYCODONE-ACETAMINOPHEN 5-325 MG PO TABS
1.0000 | ORAL_TABLET | Freq: Once | ORAL | Status: AC
  Administered 2022-06-28: 1 via ORAL
  Filled 2022-06-28: qty 1

## 2022-06-28 MED ORDER — LIDOCAINE-EPINEPHRINE (PF) 2 %-1:200000 IJ SOLN
10.0000 mL | Freq: Once | INTRAMUSCULAR | Status: AC
  Administered 2022-06-28: 10 mL
  Filled 2022-06-28: qty 20

## 2022-06-28 NOTE — ED Triage Notes (Signed)
Patient reports to the ER for fall. Patient reports she was trying to go to breakfast and went to step up on a curb and suffered a fall. Patient reports she tried to catch her fall with her left arm. Patient has facial lacerations. Swelling to the right eye with bruising and a skin tear to the lid of the eye. Patient is in a c-collar per EMS. Denies neck or back pain. Has a brace to the right arm. Reports left thumb pain. Denies knee pain. Denies LOC, weakness or dizziness, or blood thinners.

## 2022-06-28 NOTE — ED Provider Notes (Signed)
..  Laceration Repair  Date/Time: 06/28/2022 1:41 PM  Performed by: Bud Face, PA-C Authorized by: Bud Face, PA-C   Consent:    Consent obtained:  Verbal   Consent given by:  Patient   Risks, benefits, and alternatives were discussed: yes     Risks discussed:  Infection, pain, retained foreign body, need for additional repair, poor cosmetic result, tendon damage, nerve damage, poor wound healing and vascular damage   Alternatives discussed:  No treatment, delayed treatment, observation and referral Universal protocol:    Procedure explained and questions answered to patient or proxy's satisfaction: yes     Required blood products, implants, devices, and special equipment available: yes     Patient identity confirmed:  Verbally with patient Anesthesia:    Anesthesia method:  Local infiltration   Local anesthetic:  Lidocaine 2% WITH epi Laceration details:    Location:  Face   Face location:  R eyebrow   Length (cm):  4   Depth (mm):  2 Treatment:    Area cleansed with:  Soap and water   Amount of cleaning:  Standard   Irrigation solution:  Sterile saline   Irrigation volume:  500 ml   Irrigation method:  Pressure wash Skin repair:    Repair method:  Sutures   Suture size:  5-0   Suture material:  Fast-absorbing gut   Suture technique:  Simple interrupted   Number of sutures:  6 Approximation:    Approximation:  Close Repair type:    Repair type:  Simple Post-procedure details:    Dressing:  Antibiotic ointment and non-adherent dressing   Procedure completion:  Tolerated well, no immediate complications .Marland KitchenLaceration Repair  Date/Time: 06/28/2022 1:43 PM  Performed by: Bud Face, PA-C Authorized by: Bud Face, PA-C   Consent:    Consent obtained:  Verbal   Consent given by:  Patient   Risks, benefits, and alternatives were discussed: yes     Risks discussed:  Infection, pain, retained foreign body, need for additional repair, poor cosmetic result,  tendon damage, nerve damage, poor wound healing and vascular damage   Alternatives discussed:  No treatment, delayed treatment, observation and referral Universal protocol:    Procedure explained and questions answered to patient or proxy's satisfaction: yes     Required blood products, implants, devices, and special equipment available: yes     Patient identity confirmed:  Verbally with patient Anesthesia:    Anesthesia method:  Local infiltration   Local anesthetic:  Lidocaine 2% WITH epi Laceration details:    Location:  Lip   Lip location:  Upper exterior lip   Length (cm):  1   Depth (mm):  2 Treatment:    Area cleansed with:  Soap and water   Amount of cleaning:  Standard   Irrigation solution:  Sterile saline   Irrigation volume:  100 ml Skin repair:    Repair method:  Sutures   Suture size:  5-0   Suture material:  Fast-absorbing gut   Suture technique:  Simple interrupted   Number of sutures:  1 Approximation:    Approximation:  Close Repair type:    Repair type:  Simple Post-procedure details:    Dressing:  Open (no dressing)   Procedure completion:  Tolerated well, no immediate complications     Bud Face, PA-C 06/28/22 1344    Davonna Belling, MD 06/29/22 1520

## 2022-06-28 NOTE — ED Provider Notes (Signed)
[place] EMERGENCY DEPT Provider Note   CSN: [id number] Arrival date & time: 06/28/22  1045     History  Chief Complaint  Patient presents with   Kylie Christensen is a 81 y.o. female.   Fall  Patient presents after fall.  Mechanical.  Tripped while walking hit face and pain in bilateral hands.  Abrasion to face.  No loss conscious.  Not on blood thinners.  Had recent right wrist surgery by Dr. Amedeo Plenty.  No numbness or weakness.  No confusion.  Does feel as if her teeth could be a little loose on her upper jaw.     Home Medications Prior to Admission medications   Medication Sig Start Date End Date Taking? Authorizing Provider  acetaminophen (TYLENOL) 325 MG tablet Take 2 tablets (650 mg total) by mouth every 4 (four) hours as needed for mild pain, fever or headache. 07/11/21 07/11/22  [place] Donnamarie Poag, DO  B Complex-C (B-COMPLEX WITH VITAMIN C) tablet Take 1 tablet by mouth daily.    [provider]  budesonide (ENTOCORT EC) 3 MG 24 hr capsule Take 9 mg by mouth daily. 06/15/21   [provider]  calcium carbonate (OS-CAL - DOSED IN MG OF ELEMENTAL CALCIUM) 1250 (500 Ca) MG tablet Take 1 tablet by mouth.    [provider]  cholecalciferol (VITAMIN D) 1000 units tablet Take 1,000 Units by mouth daily.    [provider]  colestipol (COLESTID) 1 g tablet Take 1 g by mouth 2 (two) times daily. 07/31/18   [provider]  docusate sodium (COLACE) 100 MG capsule Take 1 capsule (100 mg total) by mouth 2 (two) times daily. Patient not taking: Reported on 05/22/2022 01/21/16   Iran Planas, MD  levothyroxine (SYNTHROID) 25 MCG tablet Take 25 mcg by mouth daily before breakfast.    [provider]  Multiple Vitamin (MULTIVITAMIN WITH MINERALS) TABS tablet Take 1 tablet by mouth daily.    [provider]  Multiple Vitamins-Minerals (PRESERVISION AREDS 2+MULTI VIT PO) Take 1 Dose by mouth daily.    [provider]  Omega-3 Fatty Acids (FISH OIL PO) Take 1 Dose by mouth daily.    [provider]  oxyCODONE-acetaminophen (PERCOCET/ROXICET) 5-325 MG tablet Take 1 tablet by mouth every 6 (six) hours as needed for severe pain. 11/19/21   Dorie Rank, MD  sertraline (ZOLOFT) 25 MG tablet TAKE 1 TABLET BY MOUTH BY MOUTH ONCE DAILY    [provider]      Allergies    Nsaids and Amoxicillin    Review of Systems   Review of Systems  Physical Exam Updated Vital Signs BP (!) 142/78 (BP Location: Left Arm)   Pulse 64   Temp 98.1 F (36.7 C) (Oral)   Resp 14   Ht '5\' 3"'$  (1.6 m)   Wt 77.1 kg   SpO2 100%   BMI 30.11 kg/m  Physical Exam Vitals and nursing note reviewed.  HENT:     Head:     Comments: Approximately 2 cm right forehead laceration vertical.  Eye movements intact but does have hematoma right upper lid.  Also 5 mm laceration on mucosal aspect of upper lip and does have hematoma and lower lip.  Slight loosening of right third tooth from the midline on her maxilla. Eyes:     Pupils: Pupils are equal, round, and reactive to light.  Cardiovascular:     Rate and Rhythm: Regular rhythm.  Pulmonary:  Breath sounds: No wheezing or rhonchi.  Abdominal:     Tenderness: There is no abdominal tenderness.  Musculoskeletal:     Cervical back: No tenderness.     Comments: Tenderness over hand on right.  Particularly over fourth and fifth fingers.  Hand is in a splint.  Also swelling of first MCP joint on the left side.  Sensation intact distally.  Skin:    General: Skin is warm.  Neurological:     Mental Status: She is alert.     ED Results / Procedures / Treatments   Labs (all labs ordered are listed, but only abnormal results are displayed) Labs Reviewed - No data to display  EKG None  Radiology CT Head Wo Contrast  Result Date: 06/28/2022 CLINICAL DATA:  Fall. EXAM: CT HEAD WITHOUT CONTRAST CT MAXILLOFACIAL WITHOUT CONTRAST CT CERVICAL SPINE WITHOUT  CONTRAST TECHNIQUE: Multidetector CT imaging of the head, cervical spine, and maxillofacial structures were performed using the standard protocol without intravenous contrast. Multiplanar CT image reconstructions of the cervical spine and maxillofacial structures were also generated. RADIATION DOSE REDUCTION: This exam was performed according to the departmental dose-optimization program which includes automated exposure control, adjustment of the mA and/or kV according to patient size and/or use of iterative reconstruction technique. COMPARISON:  CT head and cervical spine dated Apr 23, 2015. FINDINGS: CT HEAD FINDINGS Brain: No evidence of acute infarction, hemorrhage, hydrocephalus, extra-axial collection or mass lesion/mass effect. Progressive overall mild atrophy and chronic microvascular ischemic changes, within normal limits for age. Vascular: No hyperdense vessel or unexpected calcification. Skull: Normal. Negative for fracture or focal lesion. Other: None. CT MAXILLOFACIAL FINDINGS Osseous: No fracture or mandibular dislocation. No destructive process. Orbits: Negative. No traumatic or inflammatory finding. Sinuses: Minimal bilateral ethmoid air cell and right maxillary sinus mucosal thickening. Otherwise clear. Soft tissues: Small right periorbital hematoma. CT CERVICAL SPINE FINDINGS Alignment: No traumatic malalignment. Unchanged trace anterolisthesis at C6-C7 and C7-T1. Skull base and vertebrae: No acute fracture. No primary bone lesion or focal pathologic process. Soft tissues and spinal canal: No prevertebral fluid or swelling. No visible canal hematoma. Disc levels: Similar mild-to-moderate multilevel disc height loss and diffuse moderate facet uncovertebral hypertrophy. Upper chest: Negative. Other: None. IMPRESSION: 1. No acute intracranial abnormality. 2. No acute facial fracture. Small right periorbital hematoma. 3. No acute cervical spine fracture or subluxation. Electronically Signed   By:  Titus Dubin M.D.   On: 06/28/2022 11:53   CT Cervical Spine Wo Contrast  Result Date: 06/28/2022 CLINICAL DATA:  Fall. EXAM: CT HEAD WITHOUT CONTRAST CT MAXILLOFACIAL WITHOUT CONTRAST CT CERVICAL SPINE WITHOUT CONTRAST TECHNIQUE: Multidetector CT imaging of the head, cervical spine, and maxillofacial structures were performed using the standard protocol without intravenous contrast. Multiplanar CT image reconstructions of the cervical spine and maxillofacial structures were also generated. RADIATION DOSE REDUCTION: This exam was performed according to the departmental dose-optimization program which includes automated exposure control, adjustment of the mA and/or kV according to patient size and/or use of iterative reconstruction technique. COMPARISON:  CT head and cervical spine dated Apr 23, 2015. FINDINGS: CT HEAD FINDINGS Brain: No evidence of acute infarction, hemorrhage, hydrocephalus, extra-axial collection or mass lesion/mass effect. Progressive overall mild atrophy and chronic microvascular ischemic changes, within normal limits for age. Vascular: No hyperdense vessel or unexpected calcification. Skull: Normal. Negative for fracture or focal lesion. Other: None. CT MAXILLOFACIAL FINDINGS Osseous: No fracture or mandibular dislocation. No destructive process. Orbits: Negative. No traumatic or inflammatory finding. Sinuses: Minimal bilateral ethmoid air  cell and right maxillary sinus mucosal thickening. Otherwise clear. Soft tissues: Small right periorbital hematoma. CT CERVICAL SPINE FINDINGS Alignment: No traumatic malalignment. Unchanged trace anterolisthesis at C6-C7 and C7-T1. Skull base and vertebrae: No acute fracture. No primary bone lesion or focal pathologic process. Soft tissues and spinal canal: No prevertebral fluid or swelling. No visible canal hematoma. Disc levels: Similar mild-to-moderate multilevel disc height loss and diffuse moderate facet uncovertebral hypertrophy. Upper chest:  Negative. Other: None. IMPRESSION: 1. No acute intracranial abnormality. 2. No acute facial fracture. Small right periorbital hematoma. 3. No acute cervical spine fracture or subluxation. Electronically Signed   By: Titus Dubin M.D.   On: 06/28/2022 11:53   CT MAXILLOFACIAL WO CONTRAST  Result Date: 06/28/2022 CLINICAL DATA:  Fall. EXAM: CT HEAD WITHOUT CONTRAST CT MAXILLOFACIAL WITHOUT CONTRAST CT CERVICAL SPINE WITHOUT CONTRAST TECHNIQUE: Multidetector CT imaging of the head, cervical spine, and maxillofacial structures were performed using the standard protocol without intravenous contrast. Multiplanar CT image reconstructions of the cervical spine and maxillofacial structures were also generated. RADIATION DOSE REDUCTION: This exam was performed according to the departmental dose-optimization program which includes automated exposure control, adjustment of the mA and/or kV according to patient size and/or use of iterative reconstruction technique. COMPARISON:  CT head and cervical spine dated Apr 23, 2015. FINDINGS: CT HEAD FINDINGS Brain: No evidence of acute infarction, hemorrhage, hydrocephalus, extra-axial collection or mass lesion/mass effect. Progressive overall mild atrophy and chronic microvascular ischemic changes, within normal limits for age. Vascular: No hyperdense vessel or unexpected calcification. Skull: Normal. Negative for fracture or focal lesion. Other: None. CT MAXILLOFACIAL FINDINGS Osseous: No fracture or mandibular dislocation. No destructive process. Orbits: Negative. No traumatic or inflammatory finding. Sinuses: Minimal bilateral ethmoid air cell and right maxillary sinus mucosal thickening. Otherwise clear. Soft tissues: Small right periorbital hematoma. CT CERVICAL SPINE FINDINGS Alignment: No traumatic malalignment. Unchanged trace anterolisthesis at C6-C7 and C7-T1. Skull base and vertebrae: No acute fracture. No primary bone lesion or focal pathologic process. Soft tissues  and spinal canal: No prevertebral fluid or swelling. No visible canal hematoma. Disc levels: Similar mild-to-moderate multilevel disc height loss and diffuse moderate facet uncovertebral hypertrophy. Upper chest: Negative. Other: None. IMPRESSION: 1. No acute intracranial abnormality. 2. No acute facial fracture. Small right periorbital hematoma. 3. No acute cervical spine fracture or subluxation. Electronically Signed   By: Titus Dubin M.D.   On: 06/28/2022 11:53   DG Hand Complete Left  Result Date: 06/28/2022 CLINICAL DATA:  Fall.  Left hand pain EXAM: LEFT HAND - COMPLETE 3+ VIEW COMPARISON:  03/29/2018 FINDINGS: Bones are demineralized. No evidence of acute fracture. No dislocation. Posttraumatic deformities of the distal radius and ulna with prior distal radial ORIF. Moderate osteoarthritic changes of the hand. No focal soft tissue swelling. IMPRESSION: 1. No evidence of acute fracture or dislocation of the left hand. 2. Moderate osteoarthritic changes of the hand. Electronically Signed   By: Davina Poke D.O.   On: 06/28/2022 11:43   DG Hand Complete Right  Result Date: 06/28/2022 CLINICAL DATA:  Fall EXAM: RIGHT HAND - COMPLETE 3+ VIEW COMPARISON:  04/23/2015 FINDINGS: Acute fracture through the proximal metaphysis of the ring finger proximal phalanx with minimal ulnar displacement. Nondisplaced fracture through the proximal metaphysis of the small finger proximal phalanx. Nondisplaced fracture of the fifth metacarpal neck. Mildly displaced fracture of the ulnar styloid. Prior distal radius fracture status post ORIF. Moderate osteoarthritic changes of the hand. Bones are demineralized. There is soft tissue swelling. IMPRESSION: 1.  Acute fractures of the ring and small finger proximal phalanxes, as described above. 2. Acute nondisplaced fifth metacarpal neck fracture. 3. Mildly displaced fracture of the ulnar styloid, age indeterminate. 4. Prior ORIF of the distal radius. Electronically Signed    By: Davina Poke D.O.   On: 06/28/2022 11:42    Procedures Procedures    Medications Ordered in ED Medications  oxyCODONE-acetaminophen (PERCOCET/ROXICET) 5-325 MG per tablet 1 tablet (1 tablet Oral Given 06/28/22 1247)  lidocaine-EPINEPHrine (XYLOCAINE W/EPI) 2 %-1:200000 (PF) injection 10 mL (10 mLs Infiltration Given by Other 06/28/22 1248)    ED Course/ Medical Decision Making/ A&P                           Medical Decision Making Amount and/or Complexity of Data Reviewed Radiology: ordered.  Risk Prescription drug management.  Patient with fall.  Hematoma to face but no underlying fracture seen.  Laceration closed.  Also lacerations on lip.  Does have slightly loose tooth and can follow with the dentist as needed.  Does unfortunately have fractures of the proximal phalanx of her fourth and fifth finger on her right hand which had recent radial surgery.  Also distal fifth metacarpal fracture.  Discussed with Hilbert Odor who reviewed the x-rays.  We will transition to a ulnar gutter which should help support the wrist we will also add these fingers.  We will follow-up with Dr. Amedeo Plenty.  Velcro thumb spica on left hand for sprain of MCP joint.  No other apparent intracranial injury.  Doubt cervical injury.  Doubt intrathoracic or intra-abdominal injury.  Appears stable for discharge home.       Final Clinical Impression(s) / ED Diagnoses Final diagnoses:  Fall, initial encounter  Facial laceration, initial encounter  Lip laceration, initial encounter  Closed displaced fracture of proximal phalanx of right ring finger, initial encounter  Closed nondisplaced fracture of proximal phalanx of right little finger, initial encounter  Closed nondisplaced fracture of fifth metacarpal bone of right hand, unspecified portion of metacarpal, initial encounter  Sprain of metacarpophalangeal (MCP) joint of left thumb, initial encounter    Rx / DC Orders ED Discharge Orders      None         Davonna Belling, MD 06/28/22 1600

## 2022-06-28 NOTE — Discharge Instructions (Signed)
Have the stitches taken out in about 5 days.  Follow-up with Dr. Phillip Heal make for your hand fractures.

## 2022-06-29 ENCOUNTER — Ambulatory Visit: Payer: PPO

## 2022-06-29 ENCOUNTER — Ambulatory Visit
Admission: RE | Admit: 2022-06-29 | Discharge: 2022-06-29 | Disposition: A | Discharge status: Home / Self Care | Payer: Medicare HMO | Source: Ambulatory Visit | Attending: Internal Medicine | Admitting: Internal Medicine

## 2022-06-29 DIAGNOSIS — Z780 Asymptomatic menopausal state: Secondary | ICD-10-CM | POA: Diagnosis not present

## 2022-06-29 DIAGNOSIS — Z8781 Personal history of (healed) traumatic fracture: Secondary | ICD-10-CM

## 2022-06-29 DIAGNOSIS — M810 Age-related osteoporosis without current pathological fracture: Secondary | ICD-10-CM

## 2022-06-29 DIAGNOSIS — M85851 Other specified disorders of bone density and structure, right thigh: Secondary | ICD-10-CM | POA: Diagnosis not present

## 2022-07-02 DIAGNOSIS — S62616A Displaced fracture of proximal phalanx of right little finger, initial encounter for closed fracture: Secondary | ICD-10-CM | POA: Diagnosis not present

## 2022-07-02 DIAGNOSIS — S63602A Unspecified sprain of left thumb, initial encounter: Secondary | ICD-10-CM | POA: Diagnosis not present

## 2022-07-04 DIAGNOSIS — E039 Hypothyroidism, unspecified: Secondary | ICD-10-CM | POA: Diagnosis not present

## 2022-07-04 DIAGNOSIS — E042 Nontoxic multinodular goiter: Secondary | ICD-10-CM | POA: Diagnosis not present

## 2022-07-04 DIAGNOSIS — Z9889 Other specified postprocedural states: Secondary | ICD-10-CM | POA: Diagnosis not present

## 2022-07-04 DIAGNOSIS — R296 Repeated falls: Secondary | ICD-10-CM | POA: Diagnosis not present

## 2022-07-04 DIAGNOSIS — Z8781 Personal history of (healed) traumatic fracture: Secondary | ICD-10-CM | POA: Diagnosis not present

## 2022-07-04 DIAGNOSIS — R61 Generalized hyperhidrosis: Secondary | ICD-10-CM | POA: Diagnosis not present

## 2022-07-04 DIAGNOSIS — M810 Age-related osteoporosis without current pathological fracture: Secondary | ICD-10-CM | POA: Diagnosis not present

## 2022-07-10 DIAGNOSIS — R296 Repeated falls: Secondary | ICD-10-CM | POA: Diagnosis not present

## 2022-07-10 DIAGNOSIS — F322 Major depressive disorder, single episode, severe without psychotic features: Secondary | ICD-10-CM | POA: Diagnosis not present

## 2022-07-13 ENCOUNTER — Telehealth: Payer: Self-pay | Admitting: Psychiatry

## 2022-07-13 NOTE — Telephone Encounter (Signed)
Pt is calling because she wants to know id the provider has order the MRI they talked about at Pt appointment. Pt is requesting a call back from the nurse,

## 2022-07-16 NOTE — Telephone Encounter (Signed)
05-24-2022 pt declined to schedule states she broke her wrist and is preparing for surgery/dp    She can call [employer] back [phone]

## 2022-07-16 NOTE — Telephone Encounter (Signed)
Any update on pt MRI that was ordered in June?

## 2022-07-25 DIAGNOSIS — M25531 Pain in right wrist: Secondary | ICD-10-CM | POA: Diagnosis not present

## 2022-07-25 DIAGNOSIS — S52501D Unspecified fracture of the lower end of right radius, subsequent encounter for closed fracture with routine healing: Secondary | ICD-10-CM | POA: Diagnosis not present

## 2022-07-25 DIAGNOSIS — Z4789 Encounter for other orthopedic aftercare: Secondary | ICD-10-CM | POA: Diagnosis not present

## 2022-07-25 DIAGNOSIS — S62614A Displaced fracture of proximal phalanx of right ring finger, initial encounter for closed fracture: Secondary | ICD-10-CM | POA: Diagnosis not present

## 2022-07-25 DIAGNOSIS — S62616A Displaced fracture of proximal phalanx of right little finger, initial encounter for closed fracture: Secondary | ICD-10-CM | POA: Diagnosis not present

## 2022-07-25 DIAGNOSIS — M79641 Pain in right hand: Secondary | ICD-10-CM | POA: Diagnosis not present

## 2022-07-25 DIAGNOSIS — S63602A Unspecified sprain of left thumb, initial encounter: Secondary | ICD-10-CM | POA: Diagnosis not present

## 2022-07-26 ENCOUNTER — Ambulatory Visit
Admission: RE | Admit: 2022-07-26 | Discharge: 2022-07-26 | Disposition: A | Discharge status: Home / Self Care | Payer: Medicare HMO | Source: Ambulatory Visit | Attending: Psychiatry | Admitting: Psychiatry

## 2022-07-26 DIAGNOSIS — R413 Other amnesia: Secondary | ICD-10-CM | POA: Diagnosis not present

## 2022-07-26 DIAGNOSIS — R2689 Other abnormalities of gait and mobility: Secondary | ICD-10-CM

## 2022-07-30 DIAGNOSIS — M25641 Stiffness of right hand, not elsewhere classified: Secondary | ICD-10-CM | POA: Diagnosis not present

## 2022-07-30 DIAGNOSIS — S63602A Unspecified sprain of left thumb, initial encounter: Secondary | ICD-10-CM | POA: Diagnosis not present

## 2022-07-30 DIAGNOSIS — M79641 Pain in right hand: Secondary | ICD-10-CM | POA: Diagnosis not present

## 2022-07-30 DIAGNOSIS — Z4789 Encounter for other orthopedic aftercare: Secondary | ICD-10-CM | POA: Diagnosis not present

## 2022-07-30 DIAGNOSIS — S62616A Displaced fracture of proximal phalanx of right little finger, initial encounter for closed fracture: Secondary | ICD-10-CM | POA: Diagnosis not present

## 2022-07-30 DIAGNOSIS — S52501D Unspecified fracture of the lower end of right radius, subsequent encounter for closed fracture with routine healing: Secondary | ICD-10-CM | POA: Diagnosis not present

## 2022-07-30 DIAGNOSIS — M25631 Stiffness of right wrist, not elsewhere classified: Secondary | ICD-10-CM | POA: Diagnosis not present

## 2022-07-30 DIAGNOSIS — S62614A Displaced fracture of proximal phalanx of right ring finger, initial encounter for closed fracture: Secondary | ICD-10-CM | POA: Diagnosis not present

## 2022-07-30 DIAGNOSIS — M25531 Pain in right wrist: Secondary | ICD-10-CM | POA: Diagnosis not present

## 2022-08-01 ENCOUNTER — Telehealth: Payer: Self-pay | Admitting: Psychiatry

## 2022-08-01 DIAGNOSIS — M25641 Stiffness of right hand, not elsewhere classified: Secondary | ICD-10-CM | POA: Diagnosis not present

## 2022-08-01 DIAGNOSIS — M25631 Stiffness of right wrist, not elsewhere classified: Secondary | ICD-10-CM | POA: Diagnosis not present

## 2022-08-01 NOTE — Telephone Encounter (Signed)
Please contact pt, inform her Dr Billey Gosling results via mychart  "Your brain MRI is essentially normal. It only shows mild atrophy (shrinkage of the brain), which is an expected finding for someone your age." No major concerns

## 2022-08-01 NOTE — Telephone Encounter (Signed)
Pt is asking to be called as soon as the MRI results are available

## 2022-08-01 NOTE — Telephone Encounter (Signed)
Called pt she understood and appreciated te call. She also reports that she will keep her appt in December to see Dr.

## 2022-08-06 DIAGNOSIS — M25641 Stiffness of right hand, not elsewhere classified: Secondary | ICD-10-CM | POA: Diagnosis not present

## 2022-08-06 DIAGNOSIS — M25631 Stiffness of right wrist, not elsewhere classified: Secondary | ICD-10-CM | POA: Diagnosis not present

## 2022-08-09 DIAGNOSIS — M25641 Stiffness of right hand, not elsewhere classified: Secondary | ICD-10-CM | POA: Diagnosis not present

## 2022-08-09 DIAGNOSIS — M25631 Stiffness of right wrist, not elsewhere classified: Secondary | ICD-10-CM | POA: Diagnosis not present

## 2022-08-13 DIAGNOSIS — M25631 Stiffness of right wrist, not elsewhere classified: Secondary | ICD-10-CM | POA: Diagnosis not present

## 2022-08-13 DIAGNOSIS — M25641 Stiffness of right hand, not elsewhere classified: Secondary | ICD-10-CM | POA: Diagnosis not present

## 2022-08-15 DIAGNOSIS — M25531 Pain in right wrist: Secondary | ICD-10-CM | POA: Diagnosis not present

## 2022-08-15 DIAGNOSIS — S52501D Unspecified fracture of the lower end of right radius, subsequent encounter for closed fracture with routine healing: Secondary | ICD-10-CM | POA: Diagnosis not present

## 2022-08-15 DIAGNOSIS — S62616A Displaced fracture of proximal phalanx of right little finger, initial encounter for closed fracture: Secondary | ICD-10-CM | POA: Diagnosis not present

## 2022-08-15 DIAGNOSIS — M25631 Stiffness of right wrist, not elsewhere classified: Secondary | ICD-10-CM | POA: Diagnosis not present

## 2022-08-15 DIAGNOSIS — S62614A Displaced fracture of proximal phalanx of right ring finger, initial encounter for closed fracture: Secondary | ICD-10-CM | POA: Diagnosis not present

## 2022-08-15 DIAGNOSIS — S63602A Unspecified sprain of left thumb, initial encounter: Secondary | ICD-10-CM | POA: Diagnosis not present

## 2022-08-15 DIAGNOSIS — M25641 Stiffness of right hand, not elsewhere classified: Secondary | ICD-10-CM | POA: Diagnosis not present

## 2022-08-15 DIAGNOSIS — Z4789 Encounter for other orthopedic aftercare: Secondary | ICD-10-CM | POA: Diagnosis not present

## 2022-08-15 DIAGNOSIS — M79641 Pain in right hand: Secondary | ICD-10-CM | POA: Diagnosis not present

## 2022-08-22 DIAGNOSIS — M25631 Stiffness of right wrist, not elsewhere classified: Secondary | ICD-10-CM | POA: Diagnosis not present

## 2022-08-22 DIAGNOSIS — M25641 Stiffness of right hand, not elsewhere classified: Secondary | ICD-10-CM | POA: Diagnosis not present

## 2022-08-23 ENCOUNTER — Other Ambulatory Visit: Payer: Self-pay | Admitting: Internal Medicine

## 2022-08-23 DIAGNOSIS — Z1231 Encounter for screening mammogram for malignant neoplasm of breast: Secondary | ICD-10-CM

## 2022-08-24 IMAGING — CR DG SHOULDER 2+V*L*
3 series · 3 of 3 positions shown · non-contrast
Comparison: None.

CLINICAL DATA: Unwitnessed fall.

EXAM:
LEFT SHOULDER - 2+ VIEW

[x shoulder ap left (1 of 3)]
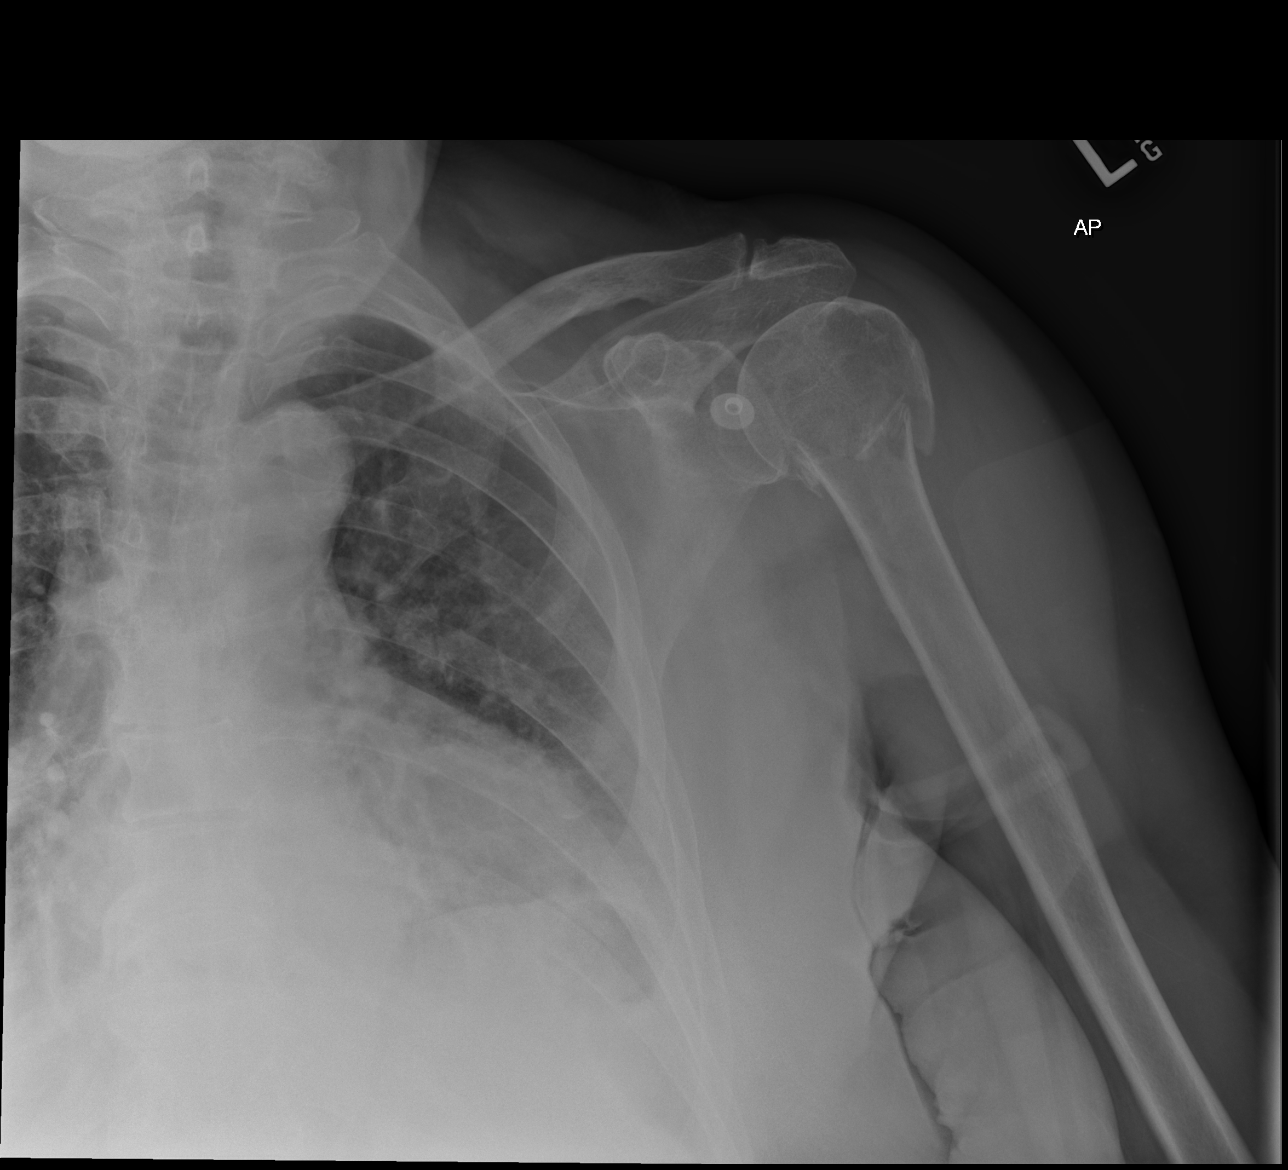

[x shoulder ap left (2 of 3)]
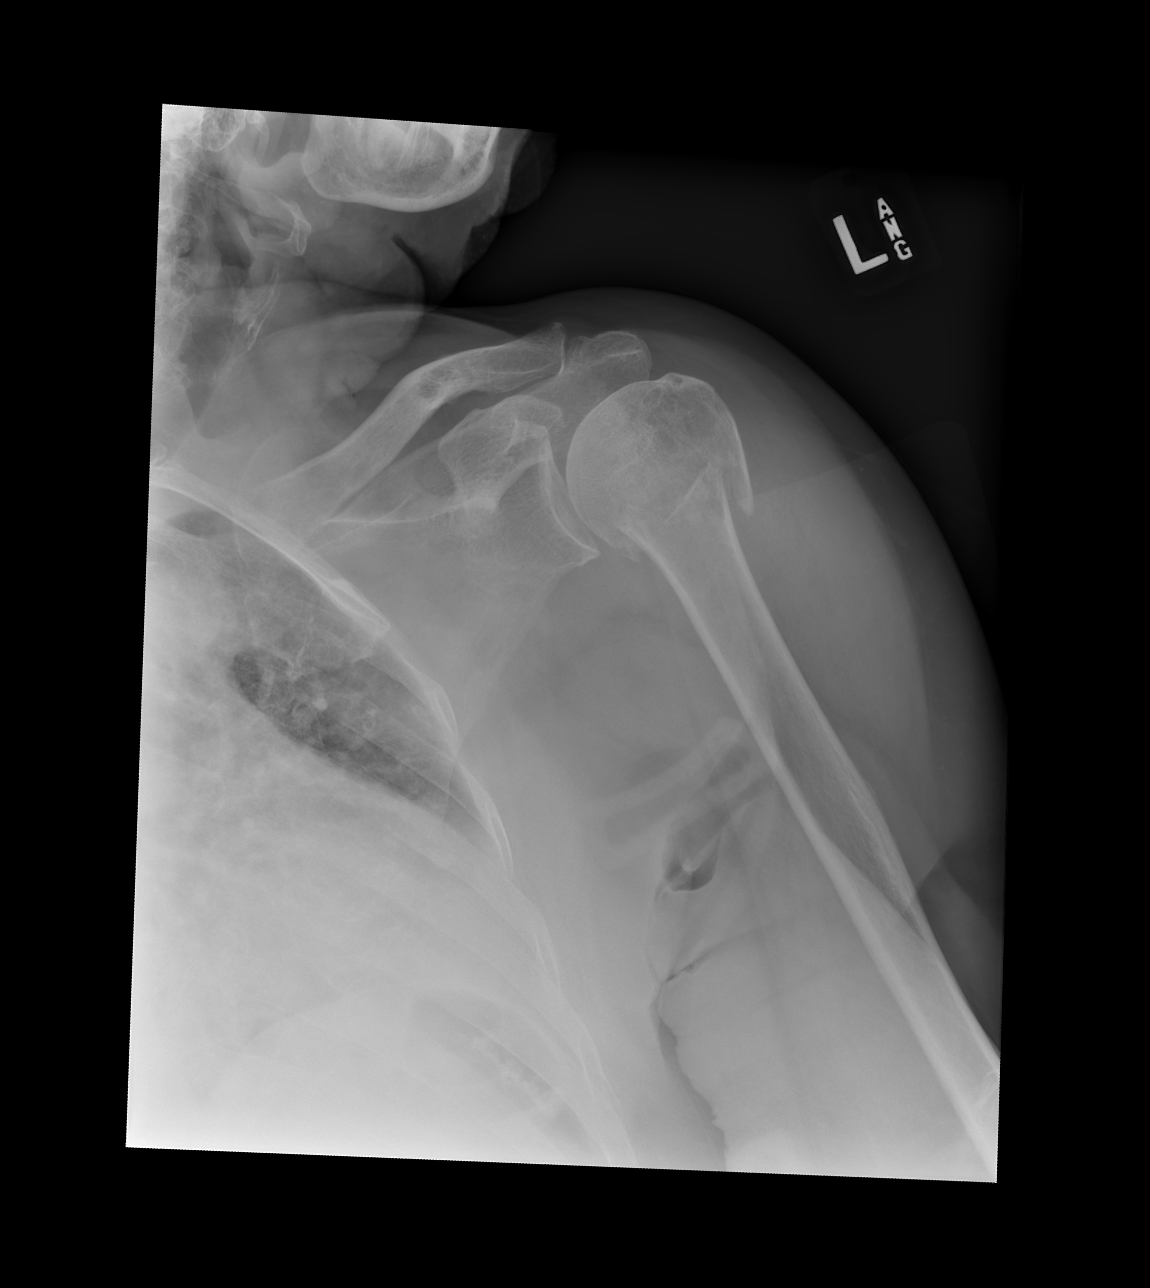

[x shoulder ap left (3 of 3)]
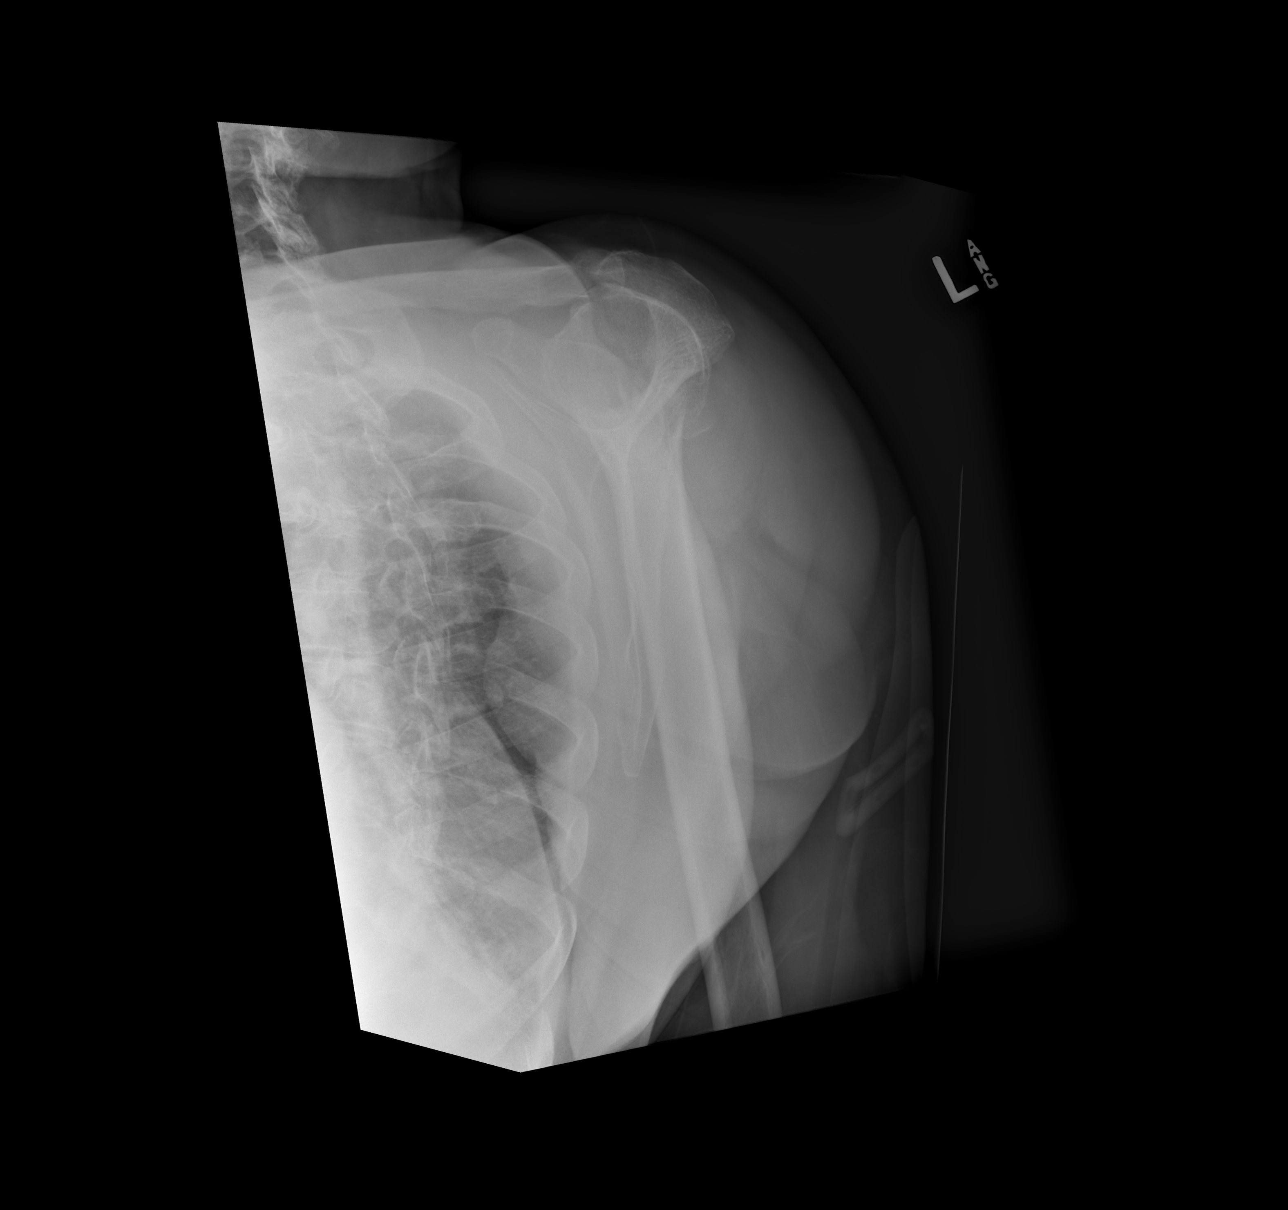

[3 of 3 positions shown; findings below may reference images not displayed]

FINDINGS: Displaced/comminuted fracture of the LEFT humeral neck. LEFT humeral
head remains grossly well positioned relative to the glenoid fossa.
Overlying acromioclavicular joint space appears normally aligned.
IMPRESSION: Displaced fracture of the LEFT humeral neck.

## 2022-08-29 DIAGNOSIS — M25631 Stiffness of right wrist, not elsewhere classified: Secondary | ICD-10-CM | POA: Diagnosis not present

## 2022-08-29 DIAGNOSIS — M25641 Stiffness of right hand, not elsewhere classified: Secondary | ICD-10-CM | POA: Diagnosis not present

## 2022-08-31 DIAGNOSIS — M25631 Stiffness of right wrist, not elsewhere classified: Secondary | ICD-10-CM | POA: Diagnosis not present

## 2022-08-31 DIAGNOSIS — M25641 Stiffness of right hand, not elsewhere classified: Secondary | ICD-10-CM | POA: Diagnosis not present

## 2022-09-03 DIAGNOSIS — K529 Noninfective gastroenteritis and colitis, unspecified: Secondary | ICD-10-CM | POA: Diagnosis not present

## 2022-09-03 DIAGNOSIS — M25641 Stiffness of right hand, not elsewhere classified: Secondary | ICD-10-CM | POA: Diagnosis not present

## 2022-09-03 DIAGNOSIS — M25631 Stiffness of right wrist, not elsewhere classified: Secondary | ICD-10-CM | POA: Diagnosis not present

## 2022-09-03 DIAGNOSIS — K52831 Collagenous colitis: Secondary | ICD-10-CM | POA: Diagnosis not present

## 2022-09-05 DIAGNOSIS — M25641 Stiffness of right hand, not elsewhere classified: Secondary | ICD-10-CM | POA: Diagnosis not present

## 2022-09-10 DIAGNOSIS — M25641 Stiffness of right hand, not elsewhere classified: Secondary | ICD-10-CM | POA: Diagnosis not present

## 2022-09-13 DIAGNOSIS — M25641 Stiffness of right hand, not elsewhere classified: Secondary | ICD-10-CM | POA: Diagnosis not present

## 2022-09-17 DIAGNOSIS — M25641 Stiffness of right hand, not elsewhere classified: Secondary | ICD-10-CM | POA: Diagnosis not present

## 2022-09-19 ENCOUNTER — Ambulatory Visit: Payer: Medicare HMO

## 2022-09-20 ENCOUNTER — Ambulatory Visit
Admission: RE | Admit: 2022-09-20 | Discharge: 2022-09-20 | Disposition: A | Discharge status: Home / Self Care | Payer: Medicare HMO | Source: Ambulatory Visit | Attending: Internal Medicine | Admitting: Internal Medicine

## 2022-09-20 DIAGNOSIS — Z1231 Encounter for screening mammogram for malignant neoplasm of breast: Secondary | ICD-10-CM | POA: Diagnosis not present

## 2022-09-26 DIAGNOSIS — S62614A Displaced fracture of proximal phalanx of right ring finger, initial encounter for closed fracture: Secondary | ICD-10-CM | POA: Diagnosis not present

## 2022-09-26 DIAGNOSIS — M25641 Stiffness of right hand, not elsewhere classified: Secondary | ICD-10-CM | POA: Diagnosis not present

## 2022-09-26 DIAGNOSIS — S52501D Unspecified fracture of the lower end of right radius, subsequent encounter for closed fracture with routine healing: Secondary | ICD-10-CM | POA: Diagnosis not present

## 2022-09-26 DIAGNOSIS — M25531 Pain in right wrist: Secondary | ICD-10-CM | POA: Diagnosis not present

## 2022-09-26 DIAGNOSIS — Z4789 Encounter for other orthopedic aftercare: Secondary | ICD-10-CM | POA: Diagnosis not present

## 2022-10-19 DIAGNOSIS — Z23 Encounter for immunization: Secondary | ICD-10-CM | POA: Diagnosis not present

## 2022-11-01 DIAGNOSIS — E782 Mixed hyperlipidemia: Secondary | ICD-10-CM | POA: Diagnosis not present

## 2022-11-01 DIAGNOSIS — E042 Nontoxic multinodular goiter: Secondary | ICD-10-CM | POA: Diagnosis not present

## 2022-11-01 DIAGNOSIS — K219 Gastro-esophageal reflux disease without esophagitis: Secondary | ICD-10-CM | POA: Diagnosis not present

## 2022-11-01 DIAGNOSIS — F322 Major depressive disorder, single episode, severe without psychotic features: Secondary | ICD-10-CM | POA: Diagnosis not present

## 2022-11-01 DIAGNOSIS — E039 Hypothyroidism, unspecified: Secondary | ICD-10-CM | POA: Diagnosis not present

## 2022-11-01 DIAGNOSIS — M810 Age-related osteoporosis without current pathological fracture: Secondary | ICD-10-CM | POA: Diagnosis not present

## 2022-11-01 DIAGNOSIS — G4700 Insomnia, unspecified: Secondary | ICD-10-CM | POA: Diagnosis not present

## 2022-11-01 DIAGNOSIS — K52831 Collagenous colitis: Secondary | ICD-10-CM | POA: Diagnosis not present

## 2022-11-01 DIAGNOSIS — G43009 Migraine without aura, not intractable, without status migrainosus: Secondary | ICD-10-CM | POA: Diagnosis not present

## 2022-11-27 NOTE — Progress Notes (Unsigned)
   CC:  imbalance  Follow-up Visit  Last visit: 05/22/22  Brief HPI: 81 year old female with a history of hypothyroidism, ischemic optic neuropathy left eye, microscopic colitis, s/p lumbar fusion who follows in clinic for imbalance and falls.  Brain MRI was ordered at her last visit. Interval History: *** MRI brain 07/26/22 showed mild age-related atrophy and was otherwise normal  Physical Exam:   Vital Signs: There were no vitals taken for this visit. GENERAL:  well appearing, in no acute distress, alert  SKIN:  Color, texture, turgor normal. No rashes or lesions HEAD:  Normocephalic/atraumatic. RESP: normal respiratory effort MSK:  No gross joint deformities.   NEUROLOGICAL: Mental Status: Alert, oriented to person, place and time, Follows commands, and Speech fluent and appropriate. Cranial Nerves: PERRL, face symmetric, no dysarthria, hearing grossly intact Motor: moves all extremities equally Gait: normal-based.  IMPRESSION: ***  PLAN: *** -MRI L-spine***  Follow-up: ***  I spent a total of *** minutes on the date of the service. Discussed medication side effects, adverse reactions and drug interactions. Written educational materials and patient instructions outlining all of the above were given.  Genia Harold, MD

## 2022-11-28 ENCOUNTER — Ambulatory Visit: Payer: Medicare HMO | Admitting: Psychiatry

## 2022-11-28 VITALS — BP 124/81 | HR 70 | Ht 63.0 in | Wt 173.2 lb

## 2022-11-28 DIAGNOSIS — G629 Polyneuropathy, unspecified: Secondary | ICD-10-CM

## 2022-11-28 DIAGNOSIS — M5416 Radiculopathy, lumbar region: Secondary | ICD-10-CM

## 2022-11-28 DIAGNOSIS — R2689 Other abnormalities of gait and mobility: Secondary | ICD-10-CM

## 2022-11-28 DIAGNOSIS — M5412 Radiculopathy, cervical region: Secondary | ICD-10-CM | POA: Diagnosis not present

## 2022-11-28 MED ORDER — BACLOFEN 10 MG PO TABS: ORAL_TABLET | ORAL | 0 refills | Status: DC

## 2022-11-30 LAB — HEMOGLOBIN A1C
Est. average glucose Bld gHb Est-mCnc: 105 mg/dL
Hgb A1c MFr Bld: 5.3 % (ref 4.8–5.6)

## 2022-11-30 LAB — MULTIPLE MYELOMA PANEL, SERUM
Albumin SerPl Elph-Mcnc: 3.7 g/dL (ref 2.9–4.4)
Albumin/Glob SerPl: 1.5 (ref 0.7–1.7)
Alpha 1: 0.2 g/dL (ref 0.0–0.4)
Alpha2 Glob SerPl Elph-Mcnc: 0.6 g/dL (ref 0.4–1.0)
B-Globulin SerPl Elph-Mcnc: 1 g/dL (ref 0.7–1.3)
Gamma Glob SerPl Elph-Mcnc: 0.8 g/dL (ref 0.4–1.8)
Globulin, Total: 2.6 g/dL (ref 2.2–3.9)
IgA/Immunoglobulin A, Serum: 187 mg/dL (ref 64–422)
IgG (Immunoglobin G), Serum: 737 mg/dL (ref 586–1602)
IgM (Immunoglobulin M), Srm: 102 mg/dL (ref 26–217)
Total Protein: 6.3 g/dL (ref 6.0–8.5)

## 2022-12-03 ENCOUNTER — Telehealth: Payer: Self-pay | Admitting: Psychiatry

## 2022-12-03 NOTE — Telephone Encounter (Signed)
Kylie Christensen: [id number] exp. 12/03/22-01/02/23 sent to GI [phone]

## 2022-12-18 NOTE — Telephone Encounter (Signed)
Pt has new [employer]. Lorie Apley: [id number] (12/18/2022 - 01/16/2023)

## 2022-12-19 ENCOUNTER — Ambulatory Visit
Admission: RE | Admit: 2022-12-19 | Discharge: 2022-12-19 | Disposition: A | Discharge status: Home / Self Care | Payer: BLUE CROSS/BLUE SHIELD | Source: Ambulatory Visit | Attending: Psychiatry | Admitting: Psychiatry

## 2022-12-19 DIAGNOSIS — M5416 Radiculopathy, lumbar region: Secondary | ICD-10-CM

## 2022-12-19 DIAGNOSIS — M5412 Radiculopathy, cervical region: Secondary | ICD-10-CM

## 2022-12-25 ENCOUNTER — Other Ambulatory Visit: Payer: Self-pay | Admitting: Psychiatry

## 2022-12-25 DIAGNOSIS — M48061 Spinal stenosis, lumbar region without neurogenic claudication: Secondary | ICD-10-CM

## 2022-12-25 DIAGNOSIS — M4802 Spinal stenosis, cervical region: Secondary | ICD-10-CM

## 2022-12-26 ENCOUNTER — Telehealth: Payer: Self-pay | Admitting: Psychiatry

## 2022-12-26 NOTE — Telephone Encounter (Signed)
Referral for Orthopedics fax to [facility]. Phone: [phone], Fax: [phone]

## 2022-12-26 NOTE — Telephone Encounter (Signed)
Referral for Orthopedics fax to [facility]. DEYCX:[phone], Fax: [phone]

## 2023-01-17 DIAGNOSIS — M810 Age-related osteoporosis without current pathological fracture: Secondary | ICD-10-CM | POA: Diagnosis not present

## 2023-01-17 DIAGNOSIS — E039 Hypothyroidism, unspecified: Secondary | ICD-10-CM | POA: Diagnosis not present

## 2023-03-01 DIAGNOSIS — M4802 Spinal stenosis, cervical region: Secondary | ICD-10-CM | POA: Diagnosis not present

## 2023-03-01 DIAGNOSIS — M5412 Radiculopathy, cervical region: Secondary | ICD-10-CM | POA: Diagnosis not present

## 2023-03-04 DIAGNOSIS — M810 Age-related osteoporosis without current pathological fracture: Secondary | ICD-10-CM | POA: Diagnosis not present

## 2023-03-06 DIAGNOSIS — M2550 Pain in unspecified joint: Secondary | ICD-10-CM | POA: Diagnosis not present

## 2023-03-06 DIAGNOSIS — R350 Frequency of micturition: Secondary | ICD-10-CM | POA: Diagnosis not present

## 2023-03-22 DIAGNOSIS — H0014 Chalazion left upper eyelid: Secondary | ICD-10-CM | POA: Diagnosis not present

## 2023-06-17 DIAGNOSIS — Z961 Presence of intraocular lens: Secondary | ICD-10-CM | POA: Diagnosis not present

## 2023-06-17 DIAGNOSIS — H04123 Dry eye syndrome of bilateral lacrimal glands: Secondary | ICD-10-CM | POA: Diagnosis not present

## 2023-06-26 DIAGNOSIS — Z79899 Other long term (current) drug therapy: Secondary | ICD-10-CM | POA: Diagnosis not present

## 2023-06-26 DIAGNOSIS — Z1331 Encounter for screening for depression: Secondary | ICD-10-CM | POA: Diagnosis not present

## 2023-06-26 DIAGNOSIS — Z0000 Encounter for general adult medical examination without abnormal findings: Secondary | ICD-10-CM | POA: Diagnosis not present

## 2023-06-26 DIAGNOSIS — N1831 Chronic kidney disease, stage 3a: Secondary | ICD-10-CM | POA: Diagnosis not present

## 2023-06-26 DIAGNOSIS — E559 Vitamin D deficiency, unspecified: Secondary | ICD-10-CM | POA: Diagnosis not present

## 2023-06-26 DIAGNOSIS — E039 Hypothyroidism, unspecified: Secondary | ICD-10-CM | POA: Diagnosis not present

## 2023-06-26 DIAGNOSIS — E782 Mixed hyperlipidemia: Secondary | ICD-10-CM | POA: Diagnosis not present

## 2023-06-26 DIAGNOSIS — F3341 Major depressive disorder, recurrent, in partial remission: Secondary | ICD-10-CM | POA: Diagnosis not present

## 2023-07-09 NOTE — Progress Notes (Unsigned)
CC:  imbalance  Follow-up Visit  Last visit: 11/28/22  Brief HPI: 82 year old female with a history of hypothyroidism, ischemic optic neuropathy left eye, microscopic colitis, s/p lumbar fusion who follows in clinic for imbalance and falls. Brain MRI 07/26/22 showed mild age-related atrophy and was otherwise normal.  At her last visit, MRI C and L spine were ordered.  Interval History: The patient continues to struggle with balance. Has difficulty walking in a straight line and feels her mobility is becoming significantly limited. Feels like her legs are going to give out from under her. She continues to have bothersome paresthesias in bilateral lower extremities up to mid-calf. It is more prominent at bedtime. Previously tried gabapentin but this caused side effects. Neuropathy workup including B12, TSH, A1c, and Myeloma panel was negative.  Baclofen helps with her neck tension and headaches.  MRI C-spine 12/19/22 showed moderate-severe spinal stenosis and severe bilateral foraminal stenosis at C4-5 and C5-6. MRI L-spine showed severe bilateral stenosis at L1-2. She was referred to orthopedics for spinal stenosis.  She saw orthopedics for cervical stenosis in March 2024, who discussed cervical epidural injections and trigger point injections. However patient was not interested in pursing these at this time.  Also notes that she has been struggling to remember the names of people and places. Has to reread things multiple times. She has forgotten to pay her bills a couple of times. Sometimes forgets to flush the toilet. No issues with medications or driving.  Physical Exam:   Vital Signs: BP 101/69 (BP Location: Right Arm, Patient Position: Sitting, Cuff Size: Normal)   Pulse 65   Ht 5\' 4"  (1.626 m)   Wt 170 lb 6.4 oz (77.3 kg)   SpO2 98%   BMI 29.25 kg/m  GENERAL:  well appearing, in no acute distress, alert  SKIN:  Color, texture, turgor normal. No rashes or lesions HEAD:   Normocephalic/atraumatic. RESP: normal respiratory effort  NEUROLOGICAL: Mental Status: Alert, oriented to person, place and time, Follows commands, and Speech fluent and appropriate.    07/10/2023   11:32 AM  Montreal Cognitive Assessment   Visuospatial/ Executive (0/5) 5  Naming (0/3) 3  Attention: Read list of digits (0/2) 2  Attention: Read list of letters (0/1) 1  Attention: Serial 7 subtraction starting at 100 (0/3) 2  Language: Repeat phrase (0/2) 2  Language : Fluency (0/1) 1  Abstraction (0/2) 2  Delayed Recall (0/5) 5  Orientation (0/6) 6  Total 29   Cranial Nerves: PERRL, face symmetric, no dysarthria, hearing grossly intact Motor: moves all extremities equally Sensation: patchy sensation loss to pinprick in RLE over right foot, otherwise intact to light touch and pinprick Gait: narrow-based gait, patient veers slightly to the left   IMPRESSION: 82 year old female with a history of hypothyroidism, ischemic optic neuropathy left eye, microscopic colitis, s/p lumbar fusion who presents for follow up of imbalance and paresthesias. Neuropathy blood work was unrevealing. MRI C-spine with severe foraminal stenosis at C4-5 and C5-6, and MRI L-spine with severe foraminal stenosis at L1-2. Will order EMG/NCV to help better differentiate if her symptoms are secondary to neuropathy, radiculopathy, or a combination of both. Referral to PT placed for balance. Discussed referral to NSGY for evaluation of her lumbar stenosis, but she would prefer to wait for the EMG results before pursuing this. She would prefer not to start a medication for paresthesias at this time. Provided information on supplements and topical options for paresthesias treatment.  MOCA  score today is normal.  PLAN: -EMG/NCV -Referral to PT for balance -continue baclofen 5-10 mg TID PRN for headaches and muscle spasms   Follow-up: 6 months  I spent a total of 55 minutes on the date of the service. Discussed  medication side effects, adverse reactions and drug interactions. Written educational materials and patient instructions outlining all of the above were given.  Ocie Doyne, MD 07/10/23 11:52 AM

## 2023-07-10 ENCOUNTER — Encounter: Payer: Self-pay | Admitting: Psychiatry

## 2023-07-10 ENCOUNTER — Ambulatory Visit: Payer: Medicare Other | Admitting: Psychiatry

## 2023-07-10 VITALS — BP 101/69 | HR 65 | Ht 64.0 in | Wt 170.4 lb

## 2023-07-10 DIAGNOSIS — R200 Anesthesia of skin: Secondary | ICD-10-CM | POA: Diagnosis not present

## 2023-07-10 DIAGNOSIS — R2689 Other abnormalities of gait and mobility: Secondary | ICD-10-CM | POA: Diagnosis not present

## 2023-07-10 DIAGNOSIS — R413 Other amnesia: Secondary | ICD-10-CM

## 2023-07-10 DIAGNOSIS — R202 Paresthesia of skin: Secondary | ICD-10-CM

## 2023-07-10 MED ORDER — BACLOFEN 10 MG PO TABS
ORAL_TABLET | ORAL | 0 refills | Status: DC
Start: 2023-07-10 — End: 2023-07-10

## 2023-07-10 MED ORDER — BACLOFEN 10 MG PO TABS: ORAL_TABLET | ORAL | 6 refills | Status: AC

## 2023-07-10 NOTE — Patient Instructions (Signed)
Supplements that can be tried for "nerve" type pain: 1. Alpha lipoic acid 600mg  daily: Has some research data but actual dose not well established as they used IV in the clinical research trials. No major side effects other than <1% of people report upset stomach. This can be taken twice per day (1200mg  daily) if no relief obtained.  2. Acetyl-L-carnitine 1000mg  3 times daily: this has the most research data backing it with reports of diabetic, HIV and chemotherapy related neuropathy patients reporting improved symptoms. Well tolerated overall but can cause GI upset so take it with food.  3. Fish Oil (750mg  eicosapentaenoic acid, 560mg  docosapentaenoic acid, 1020mg  docosahexaenoic acid) - animal models suggest it can improve neuropathy and potentially stimulate nerve growth. It has also been shown to be beneficial in humans with type I diabetes and neuropathy.  4. Lidocaine cream - 1-2% can be applied to the feet/symptomatic areas several times daily. Wear gloves!  5. Capsaicin cream: Made of chili peppers, this cream burns and is actually rather painful when you first put it on. Mechanism of action is that it overwhelms all of the pain fibers, which theoretically lessens the pain. Wear gloves!  8. Curcumin - up to 600mg  three times daily - somewhat expensive, but can be found on [employer]. Most supplements offer ~1800mg  just to be taken daily. Animal models suggest it may help with neve pain and may also help with weight loss. There are no large data human trials to support the use of this.  9. Vitamin E 10mg  twice daily  10. Vick's vapor rub  11. CBD topical cream. We cannot specifically recommend this given current federal laws, and there is no scientific data behind this at present to make a recommendation. But anecdotally people have said this can help. One specific form is called "Nature's Best".

## 2023-08-20 ENCOUNTER — Other Ambulatory Visit: Payer: Self-pay | Admitting: Internal Medicine

## 2023-08-20 DIAGNOSIS — Z1231 Encounter for screening mammogram for malignant neoplasm of breast: Secondary | ICD-10-CM

## 2023-09-16 ENCOUNTER — Encounter: Payer: Medicare Other | Admitting: Neurology

## 2023-09-23 ENCOUNTER — Ambulatory Visit: Payer: BLUE CROSS/BLUE SHIELD

## 2023-09-30 ENCOUNTER — Ambulatory Visit: Payer: Self-pay | Admitting: Neurology

## 2023-09-30 ENCOUNTER — Ambulatory Visit (INDEPENDENT_AMBULATORY_CARE_PROVIDER_SITE_OTHER): Payer: Medicare Other | Admitting: Neurology

## 2023-09-30 DIAGNOSIS — G8929 Other chronic pain: Secondary | ICD-10-CM

## 2023-09-30 DIAGNOSIS — G629 Polyneuropathy, unspecified: Secondary | ICD-10-CM

## 2023-09-30 DIAGNOSIS — G5732 Lesion of lateral popliteal nerve, left lower limb: Secondary | ICD-10-CM | POA: Diagnosis not present

## 2023-09-30 DIAGNOSIS — R202 Paresthesia of skin: Secondary | ICD-10-CM

## 2023-09-30 DIAGNOSIS — G5731 Lesion of lateral popliteal nerve, right lower limb: Secondary | ICD-10-CM | POA: Diagnosis not present

## 2023-09-30 DIAGNOSIS — R200 Anesthesia of skin: Secondary | ICD-10-CM

## 2023-09-30 DIAGNOSIS — M25561 Pain in right knee: Secondary | ICD-10-CM

## 2023-09-30 DIAGNOSIS — Z0289 Encounter for other administrative examinations: Secondary | ICD-10-CM

## 2023-09-30 DIAGNOSIS — M25562 Pain in left knee: Secondary | ICD-10-CM

## 2023-09-30 NOTE — Progress Notes (Signed)
Full Name: Kylie Christensen Gender: Female MRN #: [id number] Date of Birth: 07-03-1941    Visit Date: 09/30/2023 14:18 Age: 82 Years Examining Physician: Dr. Naomie Dean Referring Physician: Dr. Ocie Doyne Height: 5 feet 4 inch    History: She has knee pain with pain in the lateral knee area and radiation down the leg. She broke both knee caps, had sugery, swelling and pain down the side of the lateral lower leg from the knee. Started after the injuries her left is the worse. Has twisted knee on the left as well. Possibly some swelling there. No back pain or radicular symptoms, back has been just fine since the surgery on her back this is different. Recent MRI did not show lumbar radic at L5.  Pain on palpation left > right at fibular head and at the prox tib ant. Likely peroneal neuropathy In the meantime we can get xrays of the knees. Left is the worse. Recommend bilateral knee xrays and if needed Dr. Cleophas Dunker can recommend further testing such as MRis if clinically warranted. Also she has generalized knee pain all around the knee. Prior knee injuries - [address] [place] [address]  Summary: NCS were performed in the bilateral lower extremities.  EMG needle exam was performed on the left lower extremity.  The left and right peroneal motor nerves showed no response. The left superficial peroneal sensory nerve showed reduced amplitude (4 V, normal greater than 6).  The left peroneal motor nerve when recording from the tibialis anterior showed reduced amplitude (0.5 mV, normal greater than 3).  The right peroneal motor nerve when recording from the tibialis anterior showed reduced amplitude (1.3 mV, normal greater than 3).  The right superficial peroneal sensory nerve showed reduced amplitude (4 V, normal greater than 6). The left superficial peroneal sensory nerve showed reduced amplitude (4 V, normal greater than 6). The left peroneus longus muscle showed spontaneous activity,  prolonged motor unit duration, polyphasic motor units and diminished motor unit recruitment.  The left extensor hallucis longus showed spontaneous activity, increased motor unit amplitude, polyphasic motor units and diminished motor unit recruitment. All remaining nerves and remaining muscles (as indicated in the following tables) were within normal limits.     Conclusion: There are abnormalities of bilateral peroneal motor and sensory conductions (with normal sural sensory and normal tibial motor conductions) ans acute/ongoing and chronic neurogenic changes in distal peroneal muscles consistent with bilateral peroneal neuropathy.  In the setting of knee pain and knee injuries, we will refer back to Dr. Norlene Campbell per patient request due to prior relationship total knee replacements.     ------------------------------- Naomie Dean, M.D.  [facility] [address], [address] [place], [place] [address] Tel: [phone] Fax: [phone]  Verbal informed consent was obtained from the patient, patient was informed of potential risk of procedure, including bruising, bleeding, hematoma formation, infection, muscle weakness, muscle pain, numbness, among others.        MNC    Nerve / Sites Muscle Latency Ref. Amplitude Ref. Rel Amp Segments Distance Velocity Ref. Area    ms ms mV mV %  cm m/s m/s mVms  L Peroneal - EDB     Ankle EDB NR <=6.5 NR >=2.0 NR Ankle - EDB 9   NR         Pop fossa - Ankle      R Peroneal - EDB     Ankle EDB NR <=6.5 NR >=2.0 NR Ankle -  EDB 9   NR         Pop fossa - Ankle      L Tibial - AH     Ankle AH 5.1 <=5.8 7.6 >=4.0 100 Ankle - AH 9   23.1     Pop fossa AH 12.4  6.0  78.1 Pop fossa - Ankle 33 46 >=41 20.8  R Tibial - AH     Ankle AH 4.8 <=5.8 4.9 >=4.0 100 Ankle - AH 9   14.2     Pop fossa AH 12.4  6.1  125 Pop fossa - Ankle 34 45 >=41 18.1  L Peroneal - Tib Ant     Fib Head Tib Ant 2.2 <=4.7 0.5 >=3.0 100 Fib Head - Tib Ant 10    1.1     Pop fossa Tib Ant 4.0  0.3  49.6 Pop fossa - Fib Head 14 77 >=44 0.8  R Peroneal - Tib Ant     Fib Head Tib Ant 2.6 <=4.7 1.3 >=3.0 100 Fib Head - Tib Ant 10   2.1     Pop fossa Tib Ant 4.3  1.6  124 Pop fossa - Fib Head 11 64 >=44 3.3                 SNC  Nerve / Sites Rec. Site Peak Lat Ref.  Amp Ref. Segments Distance    ms ms V V  cm  L Sural - Ankle (Calf)     Calf Ankle 3.5 <=4.4 3 >=3(adjusted for age) Calf - Ankle 14  R Sural - Ankle (Calf)     Calf Ankle 4.0 <=4.4 3 >=3(Adjusted for age) Calf - Ankle 14  L Superficial peroneal - Ankle     Lat leg Ankle 3.6 <=4.4 4 >=6 Lat leg - Ankle 14  R Superficial peroneal - Ankle     Lat leg Ankle 3.9 <=4.4 4 >=6 Lat leg - Ankle 14             F  Wave    Nerve F Lat Ref.   ms ms  L Tibial - AH 47.2 <=56.0  R Tibial - AH 48.6 <=56.0          EMG Summary Table    Spontaneous MUAP Recruitment  Muscle IA Fib PSW Fasc Other Amp Dur. Poly Pattern  L. Vastus medialis Normal None None None _______ Normal Normal Normal Normal  L. Tibialis anterior Normal None None None _______ Normal Normal Normal Normal  L. Peroneus longus Normal None 2+ None _______ Normal Increased 2+ Single  L. Gastrocnemius (Medial head) Normal None None None _______ Normal Normal Normal Normal  L. Extensor hallucis longus Normal None 3+ None _______ Increased Normal Normal Reduced  L. Biceps femoris (long head) Normal None None None _______ Normal Normal Normal Normal  L. Biceps femoris (short head) Normal None None None _______ Normal Normal Normal Normal  L. Gastrocnemius (Lateral head) Normal None None None _______ Normal Normal Normal Normal  L. Gastrocnemius (Medial head) Normal None None None _______ Normal Normal Normal Normal  L. Tibialis posterior Normal None None None _______ Normal Normal Normal Normal

## 2023-09-30 NOTE — Patient Instructions (Signed)
Common Peroneal Nerve Entrapment    Common peroneal nerve entrapment is a condition that can make it hard to lift a foot. The condition results from pressure on a nerve in the lower leg called the common peroneal nerve. Your common peroneal nerve provides feeling to your outer lower leg and foot. It also supplies the muscles that move your foot and toes upward and outward. What are the causes? This condition may be caused by: Sitting cross-legged, squatting, or kneeling for long periods of time. A hard, direct hit to the outside of the lower leg. Swelling from a knee injury. A break or fracture in one of the lower leg bones. Wearing a boot or cast that ends just below the knee. A growth or cyst near the nerve. What increases the risk? This condition is more likely to develop in people who play: Contact sports, such as football or hockey. Sports where you wear high and stiff boots, such as skiing. What are the signs or symptoms? Symptoms of this condition include: Trouble lifting your foot up (foot drop). Tripping often. Your foot hitting the ground harder than normal as you walk, resulting in a slapping-type sound. Numbness, tingling, or pain in the outside of the knee, outside of the lower leg, and top of the foot. Sensitivity to pressure on the front or outside of the leg. How is this diagnosed? This condition may be diagnosed based on: Your symptoms. Your medical history. A physical exam. Tests, such as: X-rays to check the bones of your knee and leg. MRI to check tendons that attach to the side of your knee. Ultrasound to check for a growth or cyst. Electromyogram (EMG) to check your nerves. During your physical exam, your health care provider will check for numbness in your leg and test the strength of your lower leg muscles. He or she may tap the side of your lower leg to see if that causes tingling. How is this treated? Treatment for this condition may include: Avoiding  activities that make symptoms worse. Using a brace to hold up your foot and toes. Taking anti-inflammatory pain medicines to relieve swelling and lessen pain. Having medicines injected into your ankle joint to lessen pain and swelling. Doing exercises to help you regain or maintain movement (physical therapy). Surgery to take pressure off the nerve. This may be needed if there is no improvement after 2-3 months or if there is a growth pushing on the nerve. Returning gradually to full activity. Follow these instructions at home: If you have a removable brace: Wear the brace as told by your health care provider. Remove it only as told by your health care provider. Check the skin around the brace every day. Tell your health care provider about any concerns. Loosen the brace if your toes tingle, become numb, or turn cold and blue. Keep the brace clean. If the brace is not waterproof: Do not let it get wet. Cover it with a watertight covering when you take a bath or a shower. Activity Ask your health care provider when it is safe to drive if you have a brace on your foot. Do not do any activities that make pain or swelling worse. Do exercises as told by your health care provider. Return to your normal activities as told by your health care provider. Ask your health care provider what activities are safe for you. General instructions Take over-the-counter and prescription medicines only as told by your health care provider. Do not put your full weight  on your knee until your health care provider says you can. Use crutches as directed by your health care provider. Keep all follow-up visits. This is important. How is this prevented? Wear supportive footwear that is appropriate for your athletic activity. Avoid athletic activities that cause ankle pain or swelling. Wear protective padding over your lower legs when playing contact sports. Make sure your boots do not put extra pressure on the area  just below your knees. Do not sit cross-legged for long periods of time. Contact a health care provider if: Your symptoms do not get better in 2-3 months. The weakness or numbness in your leg or foot gets worse. Summary Common peroneal nerve entrapment is a condition that results from pressure on a nerve in the lower leg called the common peroneal nerve. This condition may be caused by a hard hit, swelling, a fracture, or a cyst in the lower leg. Treatment may include rest, a brace, medicines, and physical therapy. Sometimes surgery is needed. Do not do any activities that make pain or swelling worse. This information is not intended to replace advice given to you by your health care provider. Make sure you discuss any questions you have with your health care provider. Document Revised: 08/16/2021 Document Reviewed: 08/16/2021 Elsevier Patient Education  2024 [employer].

## 2023-10-07 NOTE — Procedures (Signed)
Full Name: Kylie Christensen Gender: Female MRN #: [id number] Date of Birth: 07-03-1941    Visit Date: 09/30/2023 14:18 Age: 82 Years Examining Physician: Dr. Naomie Dean Referring Physician: Dr. Ocie Doyne Height: 5 feet 4 inch    History: She has knee pain with pain in the lateral knee area and radiation down the leg. She broke both knee caps, had sugery, swelling and pain down the side of the lateral lower leg from the knee. Started after the injuries her left is the worse. Has twisted knee on the left as well. Possibly some swelling there. No back pain or radicular symptoms, back has been just fine since the surgery on her back this is different. Recent MRI did not show lumbar radic at L5.  Pain on palpation left > right at fibular head and at the prox tib ant. Likely peroneal neuropathy In the meantime we can get xrays of the knees. Left is the worse. Recommend bilateral knee xrays and if needed Dr. Cleophas Dunker can recommend further testing such as MRis if clinically warranted. Also she has generalized knee pain all around the knee. Prior knee injuries - [place] [address]  Summary: NCS were performed in the bilateral lower extremities.  EMG needle exam was performed on the left lower extremity.  The left and right peroneal motor nerves showed no response. The left superficial peroneal sensory nerve showed reduced amplitude (4 V, normal greater than 6).  The left peroneal motor nerve when recording from the tibialis anterior showed reduced amplitude (0.5 mV, normal greater than 3).  The right peroneal motor nerve when recording from the tibialis anterior showed reduced amplitude (1.3 mV, normal greater than 3).  The right superficial peroneal sensory nerve showed reduced amplitude (4 V, normal greater than 6). The left superficial peroneal sensory nerve showed reduced amplitude (4 V, normal greater than 6). The left peroneus longus muscle showed spontaneous activity,  prolonged motor unit duration, polyphasic motor units and diminished motor unit recruitment.  The left extensor hallucis longus showed spontaneous activity, increased motor unit amplitude, polyphasic motor units and diminished motor unit recruitment. All remaining nerves and remaining muscles (as indicated in the following tables) were within normal limits.     Conclusion: There are abnormalities of bilateral peroneal motor and sensory conductions (with normal sural sensory and normal tibial motor conductions) ans acute/ongoing and chronic neurogenic changes in distal peroneal muscles consistent with bilateral peroneal neuropathy.  In the setting of knee pain and knee injuries, we will refer back to Dr. Norlene Campbell per patient request due to prior relationship total knee replacements.     ------------------------------- Naomie Dean, M.D.  [facility] [address], [address] [place], [place] [address] Tel: [phone] Fax: [phone]  Verbal informed consent was obtained from the patient, patient was informed of potential risk of procedure, including bruising, bleeding, hematoma formation, infection, muscle weakness, muscle pain, numbness, among others.        MNC    Nerve / Sites Muscle Latency Ref. Amplitude Ref. Rel Amp Segments Distance Velocity Ref. Area    ms ms mV mV %  cm m/s m/s mVms  L Peroneal - EDB     Ankle EDB NR <=6.5 NR >=2.0 NR Ankle - EDB 9   NR         Pop fossa - Ankle      R Peroneal - EDB     Ankle EDB NR <=6.5 NR >=2.0 NR Ankle -  EDB 9   NR         Pop fossa - Ankle      L Tibial - AH     Ankle AH 5.1 <=5.8 7.6 >=4.0 100 Ankle - AH 9   23.1     Pop fossa AH 12.4  6.0  78.1 Pop fossa - Ankle 33 46 >=41 20.8  R Tibial - AH     Ankle AH 4.8 <=5.8 4.9 >=4.0 100 Ankle - AH 9   14.2     Pop fossa AH 12.4  6.1  125 Pop fossa - Ankle 34 45 >=41 18.1  L Peroneal - Tib Ant     Fib Head Tib Ant 2.2 <=4.7 0.5 >=3.0 100 Fib Head - Tib Ant 10    1.1     Pop fossa Tib Ant 4.0  0.3  49.6 Pop fossa - Fib Head 14 77 >=44 0.8  R Peroneal - Tib Ant     Fib Head Tib Ant 2.6 <=4.7 1.3 >=3.0 100 Fib Head - Tib Ant 10   2.1     Pop fossa Tib Ant 4.3  1.6  124 Pop fossa - Fib Head 11 64 >=44 3.3                 SNC  Nerve / Sites Rec. Site Peak Lat Ref.  Amp Ref. Segments Distance    ms ms V V  cm  L Sural - Ankle (Calf)     Calf Ankle 3.5 <=4.4 3 >=3(adjusted for age) Calf - Ankle 14  R Sural - Ankle (Calf)     Calf Ankle 4.0 <=4.4 3 >=3(Adjusted for age) Calf - Ankle 14  L Superficial peroneal - Ankle     Lat leg Ankle 3.6 <=4.4 4 >=6 Lat leg - Ankle 14  R Superficial peroneal - Ankle     Lat leg Ankle 3.9 <=4.4 4 >=6 Lat leg - Ankle 14             F  Wave    Nerve F Lat Ref.   ms ms  L Tibial - AH 47.2 <=56.0  R Tibial - AH 48.6 <=56.0          EMG Summary Table    Spontaneous MUAP Recruitment  Muscle IA Fib PSW Fasc Other Amp Dur. Poly Pattern  L. Vastus medialis Normal None None None _______ Normal Normal Normal Normal  L. Tibialis anterior Normal None None None _______ Normal Normal Normal Normal  L. Peroneus longus Normal None 2+ None _______ Normal Increased 2+ Single  L. Gastrocnemius (Medial head) Normal None None None _______ Normal Normal Normal Normal  L. Extensor hallucis longus Normal None 3+ None _______ Increased Normal Normal Reduced  L. Biceps femoris (long head) Normal None None None _______ Normal Normal Normal Normal  L. Biceps femoris (short head) Normal None None None _______ Normal Normal Normal Normal  L. Gastrocnemius (Lateral head) Normal None None None _______ Normal Normal Normal Normal  L. Gastrocnemius (Medial head) Normal None None None _______ Normal Normal Normal Normal  L. Tibialis posterior Normal None None None _______ Normal Normal Normal Normal

## 2023-10-07 NOTE — Progress Notes (Signed)
She has knee pain with pain in the lateral knee area and radiation down the leg. She broke both knee caps, had sugery, swelling and pain down the side of the lateral lower leg from the knee. Started after the injuries her left is the worse. Has twisted knee on the left as well. Possibly some swelling there. No back pain or radicular symptoms, back has been just fine since the surgery on her back this is different. Recent MRI did not show lumbar radic at L5.  Pain on palpation left > right at fibular head and at the prox tib ant. Likely peroneal neuropathy In the meantime we can get xrays of the knees. Left is the worse. Recommend bilateral knee xrays and if needed Dr. Cleophas Dunker can recommend further testing such as MRis if clinically warranted. Also she has generalized knee pain all around the knee. Prior knee injuries - [facility]ER [place]FIELD ON [address]  Discussed findings with patient, reviewed imaging of the peroneal nerve online, etiologies, treatments, recommend further evaluation with Dr. Cleophas Dunker as patient requests.  Orders Placed This Encounter  Procedures   DG Knee 3 Views Left   DG Knee 3 Views Right   Ambulatory referral to Orthopedic Surgery   Referral to : Dr.Peter W. Cleophas Dunker, MD Orthopedic surgeon [phone] #[phone]  [phone]  I spent over 30 minutes of face-to-face and non-face-to-face time with patient on the  1. Neuropathy of peroneal nerve at right knee   2. Leg numbness   3. Paresthesias   4. Peroneal neuropathy at knee, left   5. Chronic pain of both knees    diagnosis.  This included previsit chart review, lab review, study review, order entry, electronic health record documentation, patient education on the different diagnostic and therapeutic options, counseling and coordination of care, risks and benefits of management, compliance, or risk factor reduction. This does not include time spent on emg/ncs.

## 2023-10-09 ENCOUNTER — Telehealth: Payer: Self-pay | Admitting: Neurology

## 2023-10-09 NOTE — Telephone Encounter (Signed)
Referral for orthopedic surgery fax to [facility]. Phone: [phone], Fax: [phone]

## 2023-10-28 ENCOUNTER — Encounter: Payer: Self-pay | Admitting: Orthopaedic Surgery

## 2023-10-28 ENCOUNTER — Other Ambulatory Visit (INDEPENDENT_AMBULATORY_CARE_PROVIDER_SITE_OTHER): Payer: Medicare Other

## 2023-10-28 ENCOUNTER — Ambulatory Visit (INDEPENDENT_AMBULATORY_CARE_PROVIDER_SITE_OTHER): Payer: Medicare Other | Admitting: Orthopaedic Surgery

## 2023-10-28 DIAGNOSIS — G8929 Other chronic pain: Secondary | ICD-10-CM

## 2023-10-28 DIAGNOSIS — M25562 Pain in left knee: Secondary | ICD-10-CM | POA: Diagnosis not present

## 2023-10-28 MED ORDER — PREGABALIN 50 MG PO CAPS
50.0000 mg | ORAL_CAPSULE | Freq: Every day | ORAL | 0 refills | Status: DC
Start: 2023-10-28 — End: 2024-10-08

## 2023-10-28 MED ORDER — METHYLPREDNISOLONE 4 MG PO TABS: ORAL_TABLET | ORAL | 0 refills | Status: DC

## 2023-10-28 NOTE — Progress Notes (Signed)
The patient is an 82 year old female that I am seeing for the first time.  She actually has a remote history of both patellas being fractured at different times due to accidental mechanical falls.  Does not while she is here today though.  She is sent from her neurologist due to the fact that she does have some neuropathy in her bilateral extremities.  She has a remote history of lumbar spine surgery but she is describing numbness and tingling that may be related to some compression of the peroneal nerves.  She has nerve conduction studies that suggest that.  She denies any weakness at all.  She does have some numbness and tingling wakes her up at night more than during the day.  She said this really flared up after an old injury to her knee where she twisted her left knee about a year ago.  She does have physical therapy available at the independent living facility that they are members of.  Her husband is with her today.  She is not a diabetic.  She does not walk with assistive device.  I was able to review all of her notes within epic in terms of her past medical history and medications.  On exam both knees have neutral alignment.  Both knees have significant patellofemoral crepitation but no instability on exam and excellent range of motion.  Neither knee has pain along the joint lines.  Both knees do have pain along the IT band proximal to the fibula but then just distal to the fibula more on the left than the right.  There is not a true Tinel's sign over either peroneal area of either knee.  I am able to have her stand up on her toes easily on each side and alternate this.  She has 5 out of 5 strength with foot and ankle dorsiflexion and plantarflexion as well as inversion and eversion.  An x-ray of the left knee standing shows significant patellofemoral arthritis but the medial lateral compartments are well-maintained.  There is previous hardware from fixation of a patella fracture that is healed  completely and there is no complicating features the hardware.  She does have patellofemoral arthritis but most of her pain seems to be along the IT band and the peroneal nerve areas.  There is not a true Tinel's sign and there is no muscle weakness I do not think this is chronic nerve compression.  I would actually like to have her try outpatient physical therapy at her facility with any modalities that can help decrease the IT band pain.  I will also like to try a 6-day steroid taper and try low-dose Lyrica at night.  She has had Neurontin in the past and she has had not a great reaction to the Neurontin so it is worth at this point trying Lyrica.  She agrees to this treatment plan.  Will see her back in a month to see how she is doing overall.  As of now, I do not feel that surgery is necessary and she may end up needing follow-up with neurosurgery as well who performed her lumbar fusion.

## 2023-11-06 DIAGNOSIS — R279 Unspecified lack of coordination: Secondary | ICD-10-CM | POA: Diagnosis not present

## 2023-11-06 DIAGNOSIS — M6281 Muscle weakness (generalized): Secondary | ICD-10-CM | POA: Diagnosis not present

## 2023-11-06 DIAGNOSIS — M79605 Pain in left leg: Secondary | ICD-10-CM | POA: Diagnosis not present

## 2023-11-12 DIAGNOSIS — K529 Noninfective gastroenteritis and colitis, unspecified: Secondary | ICD-10-CM | POA: Diagnosis not present

## 2023-11-12 DIAGNOSIS — K52831 Collagenous colitis: Secondary | ICD-10-CM | POA: Diagnosis not present

## 2023-11-18 DIAGNOSIS — R279 Unspecified lack of coordination: Secondary | ICD-10-CM | POA: Diagnosis not present

## 2023-11-18 DIAGNOSIS — M79605 Pain in left leg: Secondary | ICD-10-CM | POA: Diagnosis not present

## 2023-11-18 DIAGNOSIS — M6281 Muscle weakness (generalized): Secondary | ICD-10-CM | POA: Diagnosis not present

## 2023-11-21 DIAGNOSIS — R279 Unspecified lack of coordination: Secondary | ICD-10-CM | POA: Diagnosis not present

## 2023-11-21 DIAGNOSIS — M79605 Pain in left leg: Secondary | ICD-10-CM | POA: Diagnosis not present

## 2023-11-21 DIAGNOSIS — M6281 Muscle weakness (generalized): Secondary | ICD-10-CM | POA: Diagnosis not present

## 2023-11-25 ENCOUNTER — Ambulatory Visit (INDEPENDENT_AMBULATORY_CARE_PROVIDER_SITE_OTHER): Payer: Medicare Other | Admitting: Orthopaedic Surgery

## 2023-11-25 ENCOUNTER — Encounter: Payer: Self-pay | Admitting: Orthopaedic Surgery

## 2023-11-25 DIAGNOSIS — G8929 Other chronic pain: Secondary | ICD-10-CM | POA: Diagnosis not present

## 2023-11-25 DIAGNOSIS — M25562 Pain in left knee: Secondary | ICD-10-CM

## 2023-11-25 NOTE — Progress Notes (Signed)
The patient is here for follow-up as a relates to her left knee.  She is 82 years old.  She is independent living with her husband but does have therapy component.  She has been going to physical therapy and she said she is actually worse with her left knee after therapy.  She is having a good day today and a little catch in the back of her knee that she gets from time to time.  She has a remote history of both her patellas being fractured that had surgery.  Examination of her left knee today shows no effusion.  Her knee is ligamentously stable with full range of motion.  At this point she said that the steroid taper helped her.  She can stop Lyrica at night.  She does have a knee sleeve at home so I want her to use that when she feels like there is episodes of her knee not feeling the most stable.  She can continue quad strengthening on her own and can stop formal PT for her left knee.  She agrees with this treatment plan.  Follow-up for any is as needed.

## 2023-11-26 DIAGNOSIS — J069 Acute upper respiratory infection, unspecified: Secondary | ICD-10-CM | POA: Diagnosis not present

## 2023-11-29 ENCOUNTER — Ambulatory Visit
Admission: RE | Admit: 2023-11-29 | Discharge: 2023-11-29 | Disposition: A | Discharge status: Home / Self Care | Payer: Medicare Other | Source: Ambulatory Visit | Attending: Internal Medicine | Admitting: Internal Medicine

## 2023-11-29 DIAGNOSIS — Z1231 Encounter for screening mammogram for malignant neoplasm of breast: Secondary | ICD-10-CM | POA: Diagnosis not present

## 2023-12-25 DIAGNOSIS — L821 Other seborrheic keratosis: Secondary | ICD-10-CM | POA: Diagnosis not present

## 2023-12-25 DIAGNOSIS — M810 Age-related osteoporosis without current pathological fracture: Secondary | ICD-10-CM | POA: Diagnosis not present

## 2023-12-25 DIAGNOSIS — E039 Hypothyroidism, unspecified: Secondary | ICD-10-CM | POA: Diagnosis not present

## 2023-12-25 DIAGNOSIS — M179 Osteoarthritis of knee, unspecified: Secondary | ICD-10-CM | POA: Diagnosis not present

## 2023-12-25 DIAGNOSIS — N1831 Chronic kidney disease, stage 3a: Secondary | ICD-10-CM | POA: Diagnosis not present

## 2023-12-25 DIAGNOSIS — E042 Nontoxic multinodular goiter: Secondary | ICD-10-CM | POA: Diagnosis not present

## 2023-12-25 DIAGNOSIS — K52831 Collagenous colitis: Secondary | ICD-10-CM | POA: Diagnosis not present

## 2024-01-20 DIAGNOSIS — Z8781 Personal history of (healed) traumatic fracture: Secondary | ICD-10-CM | POA: Diagnosis not present

## 2024-01-20 DIAGNOSIS — E042 Nontoxic multinodular goiter: Secondary | ICD-10-CM | POA: Diagnosis not present

## 2024-01-20 DIAGNOSIS — Z9889 Other specified postprocedural states: Secondary | ICD-10-CM | POA: Diagnosis not present

## 2024-01-20 DIAGNOSIS — N1831 Chronic kidney disease, stage 3a: Secondary | ICD-10-CM | POA: Diagnosis not present

## 2024-01-20 DIAGNOSIS — M810 Age-related osteoporosis without current pathological fracture: Secondary | ICD-10-CM | POA: Diagnosis not present

## 2024-01-20 DIAGNOSIS — E039 Hypothyroidism, unspecified: Secondary | ICD-10-CM | POA: Diagnosis not present

## 2024-02-26 DIAGNOSIS — D1801 Hemangioma of skin and subcutaneous tissue: Secondary | ICD-10-CM | POA: Diagnosis not present

## 2024-02-26 DIAGNOSIS — B078 Other viral warts: Secondary | ICD-10-CM | POA: Diagnosis not present

## 2024-02-26 DIAGNOSIS — D485 Neoplasm of uncertain behavior of skin: Secondary | ICD-10-CM | POA: Diagnosis not present

## 2024-02-26 DIAGNOSIS — L820 Inflamed seborrheic keratosis: Secondary | ICD-10-CM | POA: Diagnosis not present

## 2024-02-27 ENCOUNTER — Other Ambulatory Visit (HOSPITAL_COMMUNITY): Payer: Self-pay | Admitting: Internal Medicine

## 2024-02-27 DIAGNOSIS — H53123 Transient visual loss, bilateral: Secondary | ICD-10-CM | POA: Diagnosis not present

## 2024-02-27 DIAGNOSIS — G459 Transient cerebral ischemic attack, unspecified: Secondary | ICD-10-CM

## 2024-02-27 DIAGNOSIS — M810 Age-related osteoporosis without current pathological fracture: Secondary | ICD-10-CM | POA: Diagnosis not present

## 2024-02-27 DIAGNOSIS — N1831 Chronic kidney disease, stage 3a: Secondary | ICD-10-CM | POA: Diagnosis not present

## 2024-02-27 DIAGNOSIS — M542 Cervicalgia: Secondary | ICD-10-CM | POA: Diagnosis not present

## 2024-02-28 ENCOUNTER — Other Ambulatory Visit (HOSPITAL_COMMUNITY): Payer: Self-pay | Admitting: Internal Medicine

## 2024-02-28 DIAGNOSIS — G459 Transient cerebral ischemic attack, unspecified: Secondary | ICD-10-CM

## 2024-02-29 ENCOUNTER — Ambulatory Visit (HOSPITAL_COMMUNITY)
Admission: RE | Admit: 2024-02-29 | Discharge: 2024-02-29 | Disposition: A | Discharge status: Home / Self Care | Source: Ambulatory Visit | Attending: Internal Medicine | Admitting: Internal Medicine

## 2024-02-29 DIAGNOSIS — G459 Transient cerebral ischemic attack, unspecified: Secondary | ICD-10-CM | POA: Diagnosis not present

## 2024-03-23 ENCOUNTER — Ambulatory Visit (HOSPITAL_COMMUNITY): Attending: Internal Medicine

## 2024-03-23 DIAGNOSIS — G459 Transient cerebral ischemic attack, unspecified: Secondary | ICD-10-CM | POA: Insufficient documentation

## 2024-03-23 LAB — ECHOCARDIOGRAM COMPLETE
Area-P 1/2: 3.89 cm2
S' Lateral: 1.9 cm

## 2024-03-24 DIAGNOSIS — R040 Epistaxis: Secondary | ICD-10-CM | POA: Diagnosis not present

## 2024-04-09 DIAGNOSIS — R040 Epistaxis: Secondary | ICD-10-CM | POA: Diagnosis not present

## 2024-06-17 DIAGNOSIS — M542 Cervicalgia: Secondary | ICD-10-CM | POA: Diagnosis not present

## 2024-06-17 DIAGNOSIS — M6281 Muscle weakness (generalized): Secondary | ICD-10-CM | POA: Diagnosis not present

## 2024-06-22 DIAGNOSIS — M542 Cervicalgia: Secondary | ICD-10-CM | POA: Diagnosis not present

## 2024-06-22 DIAGNOSIS — M6281 Muscle weakness (generalized): Secondary | ICD-10-CM | POA: Diagnosis not present

## 2024-06-24 DIAGNOSIS — M542 Cervicalgia: Secondary | ICD-10-CM | POA: Diagnosis not present

## 2024-06-24 DIAGNOSIS — M6281 Muscle weakness (generalized): Secondary | ICD-10-CM | POA: Diagnosis not present

## 2024-06-29 DIAGNOSIS — M542 Cervicalgia: Secondary | ICD-10-CM | POA: Diagnosis not present

## 2024-06-29 DIAGNOSIS — M6281 Muscle weakness (generalized): Secondary | ICD-10-CM | POA: Diagnosis not present

## 2024-07-01 DIAGNOSIS — N1831 Chronic kidney disease, stage 3a: Secondary | ICD-10-CM | POA: Diagnosis not present

## 2024-07-01 DIAGNOSIS — M25511 Pain in right shoulder: Secondary | ICD-10-CM | POA: Diagnosis not present

## 2024-07-01 DIAGNOSIS — E039 Hypothyroidism, unspecified: Secondary | ICD-10-CM | POA: Diagnosis not present

## 2024-07-01 DIAGNOSIS — Z8781 Personal history of (healed) traumatic fracture: Secondary | ICD-10-CM | POA: Diagnosis not present

## 2024-07-01 DIAGNOSIS — G459 Transient cerebral ischemic attack, unspecified: Secondary | ICD-10-CM | POA: Diagnosis not present

## 2024-07-01 DIAGNOSIS — Z9889 Other specified postprocedural states: Secondary | ICD-10-CM | POA: Diagnosis not present

## 2024-07-01 DIAGNOSIS — M542 Cervicalgia: Secondary | ICD-10-CM | POA: Diagnosis not present

## 2024-07-01 DIAGNOSIS — Z23 Encounter for immunization: Secondary | ICD-10-CM | POA: Diagnosis not present

## 2024-07-01 DIAGNOSIS — Z0000 Encounter for general adult medical examination without abnormal findings: Secondary | ICD-10-CM | POA: Diagnosis not present

## 2024-07-01 DIAGNOSIS — E042 Nontoxic multinodular goiter: Secondary | ICD-10-CM | POA: Diagnosis not present

## 2024-07-01 DIAGNOSIS — H53123 Transient visual loss, bilateral: Secondary | ICD-10-CM | POA: Diagnosis not present

## 2024-07-01 DIAGNOSIS — M810 Age-related osteoporosis without current pathological fracture: Secondary | ICD-10-CM | POA: Diagnosis not present

## 2024-07-02 DIAGNOSIS — M542 Cervicalgia: Secondary | ICD-10-CM | POA: Diagnosis not present

## 2024-07-02 DIAGNOSIS — M6281 Muscle weakness (generalized): Secondary | ICD-10-CM | POA: Diagnosis not present

## 2024-07-06 DIAGNOSIS — M542 Cervicalgia: Secondary | ICD-10-CM | POA: Diagnosis not present

## 2024-07-06 DIAGNOSIS — M6281 Muscle weakness (generalized): Secondary | ICD-10-CM | POA: Diagnosis not present

## 2024-07-09 DIAGNOSIS — H04123 Dry eye syndrome of bilateral lacrimal glands: Secondary | ICD-10-CM | POA: Diagnosis not present

## 2024-07-09 DIAGNOSIS — H524 Presbyopia: Secondary | ICD-10-CM | POA: Diagnosis not present

## 2024-07-09 DIAGNOSIS — M542 Cervicalgia: Secondary | ICD-10-CM | POA: Diagnosis not present

## 2024-07-09 DIAGNOSIS — H47013 Ischemic optic neuropathy, bilateral: Secondary | ICD-10-CM | POA: Diagnosis not present

## 2024-07-09 DIAGNOSIS — M6281 Muscle weakness (generalized): Secondary | ICD-10-CM | POA: Diagnosis not present

## 2024-07-13 DIAGNOSIS — M542 Cervicalgia: Secondary | ICD-10-CM | POA: Diagnosis not present

## 2024-07-13 DIAGNOSIS — M6281 Muscle weakness (generalized): Secondary | ICD-10-CM | POA: Diagnosis not present

## 2024-07-16 DIAGNOSIS — M6281 Muscle weakness (generalized): Secondary | ICD-10-CM | POA: Diagnosis not present

## 2024-07-16 DIAGNOSIS — M542 Cervicalgia: Secondary | ICD-10-CM | POA: Diagnosis not present

## 2024-07-20 DIAGNOSIS — M6281 Muscle weakness (generalized): Secondary | ICD-10-CM | POA: Diagnosis not present

## 2024-07-20 DIAGNOSIS — M542 Cervicalgia: Secondary | ICD-10-CM | POA: Diagnosis not present

## 2024-07-27 DIAGNOSIS — G459 Transient cerebral ischemic attack, unspecified: Secondary | ICD-10-CM | POA: Diagnosis not present

## 2024-07-27 DIAGNOSIS — M542 Cervicalgia: Secondary | ICD-10-CM | POA: Diagnosis not present

## 2024-07-27 DIAGNOSIS — M6281 Muscle weakness (generalized): Secondary | ICD-10-CM | POA: Diagnosis not present

## 2024-08-03 DIAGNOSIS — M542 Cervicalgia: Secondary | ICD-10-CM | POA: Diagnosis not present

## 2024-08-03 DIAGNOSIS — M6281 Muscle weakness (generalized): Secondary | ICD-10-CM | POA: Diagnosis not present

## 2024-08-10 DIAGNOSIS — M542 Cervicalgia: Secondary | ICD-10-CM | POA: Diagnosis not present

## 2024-09-09 DIAGNOSIS — M810 Age-related osteoporosis without current pathological fracture: Secondary | ICD-10-CM | POA: Diagnosis not present

## 2024-09-14 ENCOUNTER — Encounter (HOSPITAL_COMMUNITY): Payer: Self-pay

## 2024-09-14 ENCOUNTER — Ambulatory Visit (HOSPITAL_COMMUNITY)
Admission: EM | Admit: 2024-09-14 | Discharge: 2024-09-14 | Disposition: A | Discharge status: Home / Self Care | Attending: Physician Assistant | Admitting: Physician Assistant

## 2024-09-14 DIAGNOSIS — S01511A Laceration without foreign body of lip, initial encounter: Secondary | ICD-10-CM | POA: Diagnosis not present

## 2024-09-14 MED ORDER — LIDOCAINE HCL (PF) 2 % IJ SOLN
INTRAMUSCULAR | Status: AC
  Filled 2024-09-14: qty 5

## 2024-09-14 MED ORDER — DOXYCYCLINE HYCLATE 100 MG PO CAPS: 100.0000 mg | ORAL_CAPSULE | Freq: Two times a day (BID) | ORAL | 0 refills | Status: AC

## 2024-09-14 NOTE — ED Triage Notes (Addendum)
 Patient reports that she tripped and fell, hitting her lip on the floor. Patient denies being on blood thinners. Patient has bruising to the left palm. Patient has a chipped front tooth.

## 2024-09-14 NOTE — Discharge Instructions (Addendum)
 Sutures are dissolvable. Clean area twice a day.  Gentle pressure if any further bleeding

## 2024-09-14 NOTE — ED Provider Notes (Signed)
 [facility]    CSN: [id number] Arrival date & time: 09/14/24  1800      History   Chief Complaint Chief Complaint  Patient presents with   Fall   Lip Laceration    HPI Kylie Christensen is a 83 y.o. female.   Patient fell in the hospital today.  Patient was walking to the bathroom   Fall    Past Medical History:  Diagnosis Date   Anxiety    Cataract    Colitis    Dizziness    GERD (gastroesophageal reflux disease)    Glaucoma    Headache    Hearing aid worn    Hypothyroidism    Microscopic colitis     Patient Active Problem List   Diagnosis Date Noted   Intractable pain 07/08/2021   Fracture of right inferior pubic ramus, closed, initial encounter (HCC) 07/08/2021   Fx humeral neck, right, closed, initial encounter 07/08/2021   Hypothyroidism (acquired) 07/08/2021   Ambulatory dysfunction 07/08/2021   Closed displaced comminuted fracture of right patella 03/31/2018   Distal radius fracture, left 01/21/2016   Dry eye syndrome 02/16/2013   Ischemic optic neuropathy of left eye 02/16/2013    Past Surgical History:  Procedure Laterality Date   ABDOMINAL HYSTERECTOMY     partial   BACK SURGERY  10/2010   lower back   BALLOON DILATION N/A 01/25/2015   Procedure: BALLOON DILATION;  Surgeon: Gladis MARLA Louder, MD;  Location: [facility];  Service: Endoscopy;  Laterality: N/A;   BIOPSY BREAST Left yrs ago   benign   CATARACT EXTRACTION     COLONOSCOPY WITH PROPOFOL  N/A 01/25/2015   Procedure: COLONOSCOPY WITH PROPOFOL ;  Surgeon: Gladis MARLA Louder, MD;  Location: [facility];  Service: Endoscopy;  Laterality: N/A;   ESOPHAGOGASTRODUODENOSCOPY (EGD) WITH PROPOFOL  N/A 01/25/2015   Procedure: ESOPHAGOGASTRODUODENOSCOPY (EGD) WITH PROPOFOL ;  Surgeon: Gladis MARLA Louder, MD;  Location: [facility];  Service: Endoscopy;  Laterality: N/A;   KNEE SURGERY     OPEN REDUCTION INTERNAL FIXATION (ORIF) DISTAL RADIAL FRACTURE Left 01/21/2016   Procedure: OPEN  REDUCTION INTERNAL FIXATION (ORIF) DISTAL RADIAL FRACTURE;  Surgeon: Prentice Pagan, MD;  Location: [facility];  Service: Orthopedics;  Laterality: Left;   rotator cuffr Right    SHOULDER ARTHROSCOPY     thyroid  nodule removed  1970   TONSILLECTOMY  age 45   WISDOM TOOTH EXTRACTION     WRIST FRACTURE SURGERY      OB History   No obstetric history on file.      Home Medications    Prior to Admission medications   Medication Sig Start Date End Date Taking? Authorizing Provider  doxycycline (VIBRAMYCIN) 100 MG capsule Take 1 capsule (100 mg total) by mouth 2 (two) times daily. 09/14/24  Yes Rachal Dvorsky K, PA-C  B Complex-C (B-COMPLEX WITH VITAMIN C ) tablet Take 1 tablet by mouth daily.    [provider]  baclofen  (LIORESAL ) 10 MG tablet Take 1/2-1 pill up to 3 times a day as needed for headaches and muscle spasms 07/10/23   Rush Nest, MD  budesonide  (ENTOCORT EC ) 3 MG 24 hr capsule Take 9 mg by mouth daily. Taking 1 06/15/21   [provider]  calcium  carbonate (OS-CAL - DOSED IN MG OF ELEMENTAL CALCIUM ) 1250 (500 Ca) MG tablet Take 1 tablet by mouth.    [provider]  cholecalciferol  (VITAMIN D ) 1000 units tablet Take 1,000 Units by mouth daily.    [provider]  colestipol  (COLESTID ) 1 g tablet Take 1 g by mouth 2 (two) times daily. 07/31/18   [provider]  docusate sodium  (COLACE) 100 MG capsule Take 1 capsule (100 mg total) by mouth 2 (two) times daily. Patient not taking: Reported on 05/22/2022 01/21/16   Shari Easter, MD  levothyroxine  (SYNTHROID ) 25 MCG tablet Take 25 mcg by mouth daily before breakfast.    [provider]  methylPREDNISolone  (MEDROL ) 4 MG tablet Medrol  dose pack. Take as instructed Patient not taking: Reported on 09/14/2024 10/28/23   Vernetta Lonni GRADE, MD  Multiple Vitamin (MULTIVITAMIN WITH MINERALS) TABS tablet Take 1 tablet by mouth daily. Patient not taking: Reported on 09/14/2024    [provider]  Multiple Vitamins-Minerals (PRESERVISION AREDS 2+MULTI VIT PO) Take 1 Dose by mouth daily. Patient not taking: Reported on 09/14/2024    [provider]  Omega-3 Fatty Acids (FISH OIL PO) Take 1 Dose by mouth daily. Patient not taking: Reported on 07/10/2023    [provider]  oxyCODONE -acetaminophen  (PERCOCET/ROXICET) 5-325 MG tablet Take 1 tablet by mouth every 6 (six) hours as needed for severe pain. Patient not taking: Reported on 11/28/2022 11/19/21   Randol Simmonds, MD  pregabalin  (LYRICA ) 50 MG capsule Take 1 capsule (50 mg total) by mouth at bedtime. Patient not taking: Reported on 09/14/2024 10/28/23   Vernetta Lonni GRADE, MD  sertraline  (ZOLOFT ) 25 MG tablet Take 50 mg by mouth daily.    [provider]    Family History Family History  Problem Relation Age of Onset   Diabetes Mother    Other Mother        Zollinger-Ellison syndrome   Heart attack Father 36       Died of MI at 28 yo.   Goiter Paternal Grandmother     Social History Social History   Tobacco Use   Smoking status: Never   Smokeless tobacco: Never  Vaping Use   Vaping status: Never Used  Substance Use Topics   Alcohol  use: Yes    Comment: occasional   Drug use: Never     Allergies   Nsaids and Amoxicillin   Review of Systems Review of Systems  All other systems reviewed and are negative.    Physical Exam Triage Vital Signs ED Triage Vitals  Encounter Vitals Group     BP 09/14/24 1915 139/82     Girls Systolic BP Percentile --      Girls Diastolic BP Percentile --      Boys Systolic BP Percentile --      Boys Diastolic BP Percentile --      Pulse Rate 09/14/24 1915 71     Resp 09/14/24 1915 16     Temp 09/14/24 1915 97.8 F (36.6 C)     Temp Source 09/14/24 1915 Oral     SpO2 09/14/24 1915 94 %     Weight --      Height --      Head Circumference --      Peak Flow --      Pain Score 09/14/24 1914 2     Pain Loc --      Pain Education --       Exclude from Growth Chart --    No data found.  Updated Vital Signs BP 139/82 (BP Location: Right Arm)   Pulse 71   Temp 97.8 F (36.6 C) (Oral)   Resp 16   SpO2 94%   Visual Acuity Right Eye Distance:  Left Eye Distance:   Bilateral Distance:    Right Eye Near:   Left Eye Near:    Bilateral Near:     Physical Exam Vitals and nursing note reviewed.  Constitutional:      Appearance: She is well-developed.  HENT:     Head: Normocephalic.     Mouth/Throat:     Mouth: Mucous membranes are moist.     Comments: 4 mm through and through laceration lower lip broken top incisor. Cardiovascular:     Rate and Rhythm: Normal rate.  Pulmonary:     Effort: Pulmonary effort is normal.  Abdominal:     General: There is no distension.  Musculoskeletal:        General: Normal range of motion.     Cervical back: Normal range of motion.     Comments: Small area of bruising right knee.  Small bruise left wrist.,  Full range of motion  Skin:    General: Skin is warm.  Neurological:     General: No focal deficit present.     Mental Status: She is alert and oriented to person, place, and time.      UC Treatments / Results  Labs (all labs ordered are listed, but only abnormal results are displayed) Labs Reviewed - No data to display  EKG   Radiology No results found.  Procedures Wound Care  Date/Time: 09/14/2024 8:50 PM  Performed by: Flint Sonny POUR, PA-C Authorized by: Flint Sonny POUR, PA-C   Consent:    Consent obtained:  Verbal   Consent given by:  Patient   Risks discussed:  Bleeding Universal protocol:    Procedure explained and questions answered to patient or proxy's satisfaction: no     Patient identity confirmed:  Verbally with patient Pre-procedure details:    Preparation: Patient was prepped and draped in usual sterile fashion   Anesthesia:    Anesthesia method:  Local infiltration   Local anesthetic:  Lidocaine  2% w/o epi Procedure details:     Wound location:  Mouth/lip   Lip location:  Lower exterior lip Skin layer closed with:    Vicryl Rapide size:  5-0   Technique:  Interrupted  (including critical care time)  Medications Ordered in UC Medications - No data to display  Initial Impression / Assessment and Plan / UC Course  I have reviewed the triage vital signs and the nursing notes.  Pertinent labs & imaging results that were available during my care of the patient were reviewed by me and considered in my medical decision making (see chart for details).      Final Clinical Impressions(s) / UC Diagnoses   Final diagnoses:  Lip laceration, initial encounter     Discharge Instructions      Sutures are dissolvable. Clean area twice a day.  Gentle pressure if any further bleeding    ED Prescriptions     Medication Sig Dispense Auth. Provider   doxycycline (VIBRAMYCIN) 100 MG capsule Take 1 capsule (100 mg total) by mouth 2 (two) times daily. 20 capsule Aolanis Crispen K, PA-C      PDMP not reviewed this encounter. An After Visit Summary was printed and given to the patient.       Flint Sonny POUR, [place] 09/14/24 2052

## 2024-10-08 ENCOUNTER — Other Ambulatory Visit: Payer: Self-pay

## 2024-10-08 ENCOUNTER — Emergency Department (HOSPITAL_BASED_OUTPATIENT_CLINIC_OR_DEPARTMENT_OTHER): Admitting: Radiology

## 2024-10-08 ENCOUNTER — Encounter (HOSPITAL_BASED_OUTPATIENT_CLINIC_OR_DEPARTMENT_OTHER): Payer: Self-pay | Admitting: Emergency Medicine

## 2024-10-08 ENCOUNTER — Emergency Department (HOSPITAL_BASED_OUTPATIENT_CLINIC_OR_DEPARTMENT_OTHER)
Admission: EM | Admit: 2024-10-08 | Discharge: 2024-10-08 | Disposition: A | Discharge status: Home / Self Care | Attending: Emergency Medicine | Admitting: Emergency Medicine

## 2024-10-08 ENCOUNTER — Emergency Department (HOSPITAL_BASED_OUTPATIENT_CLINIC_OR_DEPARTMENT_OTHER)

## 2024-10-08 DIAGNOSIS — M4802 Spinal stenosis, cervical region: Secondary | ICD-10-CM | POA: Diagnosis not present

## 2024-10-08 DIAGNOSIS — S62666A Nondisplaced fracture of distal phalanx of right little finger, initial encounter for closed fracture: Secondary | ICD-10-CM | POA: Diagnosis not present

## 2024-10-08 DIAGNOSIS — Z79899 Other long term (current) drug therapy: Secondary | ICD-10-CM | POA: Diagnosis not present

## 2024-10-08 DIAGNOSIS — S0990XA Unspecified injury of head, initial encounter: Secondary | ICD-10-CM | POA: Diagnosis not present

## 2024-10-08 DIAGNOSIS — W010XXA Fall on same level from slipping, tripping and stumbling without subsequent striking against object, initial encounter: Secondary | ICD-10-CM | POA: Diagnosis not present

## 2024-10-08 DIAGNOSIS — M79671 Pain in right foot: Secondary | ICD-10-CM | POA: Diagnosis not present

## 2024-10-08 DIAGNOSIS — Z4802 Encounter for removal of sutures: Secondary | ICD-10-CM | POA: Diagnosis not present

## 2024-10-08 DIAGNOSIS — M542 Cervicalgia: Secondary | ICD-10-CM | POA: Diagnosis not present

## 2024-10-08 DIAGNOSIS — S62636A Displaced fracture of distal phalanx of right little finger, initial encounter for closed fracture: Secondary | ICD-10-CM | POA: Diagnosis not present

## 2024-10-08 DIAGNOSIS — S199XXA Unspecified injury of neck, initial encounter: Secondary | ICD-10-CM | POA: Diagnosis not present

## 2024-10-08 DIAGNOSIS — M85871 Other specified disorders of bone density and structure, right ankle and foot: Secondary | ICD-10-CM | POA: Diagnosis not present

## 2024-10-08 DIAGNOSIS — M47812 Spondylosis without myelopathy or radiculopathy, cervical region: Secondary | ICD-10-CM | POA: Diagnosis not present

## 2024-10-08 DIAGNOSIS — R519 Headache, unspecified: Secondary | ICD-10-CM | POA: Diagnosis not present

## 2024-10-08 DIAGNOSIS — W19XXXA Unspecified fall, initial encounter: Secondary | ICD-10-CM

## 2024-10-08 DIAGNOSIS — S01111A Laceration without foreign body of right eyelid and periocular area, initial encounter: Secondary | ICD-10-CM | POA: Diagnosis not present

## 2024-10-08 DIAGNOSIS — S6991XA Unspecified injury of right wrist, hand and finger(s), initial encounter: Secondary | ICD-10-CM | POA: Diagnosis not present

## 2024-10-08 DIAGNOSIS — S99921A Unspecified injury of right foot, initial encounter: Secondary | ICD-10-CM | POA: Diagnosis not present

## 2024-10-08 DIAGNOSIS — E039 Hypothyroidism, unspecified: Secondary | ICD-10-CM | POA: Diagnosis not present

## 2024-10-08 DIAGNOSIS — Z23 Encounter for immunization: Secondary | ICD-10-CM | POA: Diagnosis not present

## 2024-10-08 DIAGNOSIS — I6782 Cerebral ischemia: Secondary | ICD-10-CM | POA: Diagnosis not present

## 2024-10-08 DIAGNOSIS — S0993XA Unspecified injury of face, initial encounter: Secondary | ICD-10-CM | POA: Diagnosis not present

## 2024-10-08 MED ORDER — LIDOCAINE 5 % EX PTCH: 1.0000 | MEDICATED_PATCH | CUTANEOUS | 0 refills | Status: AC

## 2024-10-08 MED ORDER — TETANUS-DIPHTH-ACELL PERTUSSIS 5-2-15.5 LF-MCG/0.5 IM SUSP
0.5000 mL | Freq: Once | INTRAMUSCULAR | Status: AC
  Administered 2024-10-08: 0.5 mL via INTRAMUSCULAR
  Filled 2024-10-08: qty 0.5

## 2024-10-08 NOTE — ED Triage Notes (Signed)
 Tripp/ twist right ankle Last night Hit right side of face/head and right pinky finger No loc Ambulatory to triage Was seen at [facility] sent for w/u

## 2024-10-08 NOTE — ED Provider Notes (Signed)
 [place] EMERGENCY DEPARTMENT AT [facility] Provider Note   CSN: [id number] Arrival date & time: 10/08/24  1413    Patient presents with: Felton Loose Kylie Christensen is a 83 y.o. female history of hypothyroidism, dizziness here for evaluation mechanical fall.  Yesterday evening was getting out of a car, went to step up on a curb, tripped subsequently fell forward.  Hit the right side of her face, right hand and right foot pain.  Was able to walk afterwards.  She denies LOC, anticoagulation.  Today she noted some bruising to her right pinky.  She has some mild pain to her right paraspinal cervical region as well.  Was seen at walk-in clinic, subsequently sent here for further evaluation.  She denies any headache, numbness, weakness.  She denies any chest pain, shortness of breath, abdominal pain.  Has been able to walk.  She has pain mainly to the plantar aspect of her right foot and lateral aspect.  Denies any pain to her ankle, hips, pelvis.  No pain to mid or lumbar back.  Patient also requesting suture removal to her lower lip.  Seen at outside facility about 3 weeks ago had 2 dissolvable sutures placed which has not dissolved.  Denies any pain, redness, drainage to area   HPI     Prior to Admission medications   Medication Sig Start Date End Date Taking? Authorizing Provider  lidocaine  (LIDODERM ) 5 % Place 1 patch onto the skin daily. Remove & Discard patch within 12 hours or as directed by MD 10/08/24  Yes Burhanuddin Kohlmann A, PA-C  B Complex-C (B-COMPLEX WITH VITAMIN C ) tablet Take 1 tablet by mouth daily.    [provider]  baclofen  (LIORESAL ) 10 MG tablet Take 1/2-1 pill up to 3 times a day as needed for headaches and muscle spasms 07/10/23   Rush Nest, MD  budesonide  (ENTOCORT EC ) 3 MG 24 hr capsule Take 9 mg by mouth daily. Taking 1 06/15/21   [provider]  calcium  carbonate (OS-CAL - DOSED IN MG OF ELEMENTAL CALCIUM ) 1250 (500 Ca) MG tablet Take 1  tablet by mouth.    [provider]  cholecalciferol  (VITAMIN D ) 1000 units tablet Take 1,000 Units by mouth daily.    [provider]  colestipol  (COLESTID ) 1 g tablet Take 1 g by mouth 2 (two) times daily. 07/31/18   [provider]  doxycycline (VIBRAMYCIN) 100 MG capsule Take 1 capsule (100 mg total) by mouth 2 (two) times daily. 09/14/24   Sofia, Leslie K, PA-C  levothyroxine  (SYNTHROID ) 25 MCG tablet Take 25 mcg by mouth daily before breakfast.    [provider]  sertraline  (ZOLOFT ) 25 MG tablet Take 50 mg by mouth daily.    [provider]    Allergies: Nsaids and Amoxicillin    Review of Systems  Constitutional: Negative.   HENT: Negative.    Respiratory: Negative.    Cardiovascular: Negative.   Gastrointestinal: Negative.   Genitourinary: Negative.   Musculoskeletal:  Positive for neck pain.       Neck pain, right little finger pain, right foot pain  Skin:  Positive for wound.  Neurological: Negative.   All other systems reviewed and are negative.   Updated Vital Signs BP 122/69 (BP Location: Right Arm)   Pulse 66   Temp 97.9 F (36.6 C) (Oral)   Resp 18   SpO2 99%   Physical Exam Vitals and nursing note reviewed.  Constitutional:  General: She is not in acute distress.    Appearance: She is well-developed. She is not ill-appearing, toxic-appearing or diaphoretic.  HENT:     Head: Normocephalic.     Comments: 1.5 cm skin tear right lateral periorbital region.  No raccoon eyes, Battle sign.  No facial tenderness, crepitus or step-off.    Ears:     Comments: No Hemotympanums    Nose: Nose normal.     Mouth/Throat:     Lips: Pink.     Mouth: Mucous membranes are moist.     Pharynx: Oropharynx is clear. Uvula midline.     Comments: Tongue midline, no loose dentition 2 white sutures midline lip without surrounding erythema, fluctuance, induration, drainage Eyes:     Pupils: Pupils are equal, round, and reactive to  light.  Neck:      Comments: Tenderness right paraspinal cervical region, full range of motion Cardiovascular:     Rate and Rhythm: Normal rate and regular rhythm.     Pulses: Normal pulses.          Radial pulses are 2+ on the right side and 2+ on the left side.       Dorsalis pedis pulses are 2+ on the right side and 2+ on the left side.     Heart sounds: Normal heart sounds.  Pulmonary:     Effort: Pulmonary effort is normal. No respiratory distress.     Breath sounds: Normal breath sounds and air entry.     Comments: Clear bilaterally, speaks in full sentences without difficulty Chest:     Comments: Nontender chest wall.  No crepitus or step-off Abdominal:     General: Bowel sounds are normal. There is no distension.     Palpations: Abdomen is soft.     Tenderness: There is no abdominal tenderness.     Comments: Soft, nontender  Musculoskeletal:        General: Normal range of motion.     Cervical back: Normal range of motion and neck supple.     Comments: No midline tenderness thoracic, lumbar region Nontender left upper extremity, left lower extremity Pelvis stable, nontender to palpation Tenderness right little finger with ecchymosis and soft tissue swelling able to flex and extend without difficulty.  Nontender over flexor tendon synovitis.  Nontender over wrist, forearm, humerus on right. Nontender right femur, tib-fib, ankle.  Tenderness lateral aspect right foot, plantar aspect foot able to plantarflex and dorsiflex without difficulty.  Skin:    General: Skin is warm and dry.     Capillary Refill: Capillary refill takes less than 2 seconds.     Comments: Bruising right fifth little finger  Neurological:     General: No focal deficit present.     Mental Status: She is alert and oriented to person, place, and time.     Cranial Nerves: Cranial nerves 2-12 are intact.     Sensory: Sensation is intact.     Motor: Motor function is intact.     Coordination: Coordination is  intact.     Gait: Gait is intact.     (all labs ordered are listed, but only abnormal results are displayed) Labs Reviewed - No data to display  EKG: None  Radiology: CT Head Wo Contrast Result Date: 10/08/2024 CLINICAL DATA:  Fall with facial trauma EXAM: CT HEAD WITHOUT CONTRAST CT MAXILLOFACIAL WITHOUT CONTRAST TECHNIQUE: Multidetector CT imaging of the head and maxillofacial structures were performed using the standard protocol without intravenous contrast. Multiplanar CT image  reconstructions of the maxillofacial structures were also generated. RADIATION DOSE REDUCTION: This exam was performed according to the departmental dose-optimization program which includes automated exposure control, adjustment of the mA and/or kV according to patient size and/or use of iterative reconstruction technique. COMPARISON:  CT 02/29/2024, 06/28/2022 FINDINGS: CT HEAD FINDINGS Brain: No acute territorial infarction, hemorrhage or intracranial mass. Atrophy and mild chronic small vessel ischemic changes of the white matter. Stable ventricle size Vascular: No hyperdense vessels.  No unexpected calcification Skull: Normal. Negative for fracture or focal lesion. Other: None. CT MAXILLOFACIAL FINDINGS Osseous: Mastoid air cells are clear. Mandibular heads are normally position. No mandibular fracture. Pterygoid plates and zygomatic arches are intact. No acute nasal bone fracture Orbits: Negative. No traumatic or inflammatory finding. Sinuses: No fluid levels. No sinus wall fracture. Mild mucosal thickening Soft tissues: Negative. IMPRESSION: No CT evidence for acute intracranial abnormality. Atrophy and chronic small vessel ischemic changes of the white matter. No acute facial bone fracture. Electronically Signed   By: Luke Bun M.D.   On: 10/08/2024 17:00   CT Maxillofacial Wo Contrast Result Date: 10/08/2024 CLINICAL DATA:  Fall with facial trauma EXAM: CT HEAD WITHOUT CONTRAST CT MAXILLOFACIAL WITHOUT  CONTRAST TECHNIQUE: Multidetector CT imaging of the head and maxillofacial structures were performed using the standard protocol without intravenous contrast. Multiplanar CT image reconstructions of the maxillofacial structures were also generated. RADIATION DOSE REDUCTION: This exam was performed according to the departmental dose-optimization program which includes automated exposure control, adjustment of the mA and/or kV according to patient size and/or use of iterative reconstruction technique. COMPARISON:  CT 02/29/2024, 06/28/2022 FINDINGS: CT HEAD FINDINGS Brain: No acute territorial infarction, hemorrhage or intracranial mass. Atrophy and mild chronic small vessel ischemic changes of the white matter. Stable ventricle size Vascular: No hyperdense vessels.  No unexpected calcification Skull: Normal. Negative for fracture or focal lesion. Other: None. CT MAXILLOFACIAL FINDINGS Osseous: Mastoid air cells are clear. Mandibular heads are normally position. No mandibular fracture. Pterygoid plates and zygomatic arches are intact. No acute nasal bone fracture Orbits: Negative. No traumatic or inflammatory finding. Sinuses: No fluid levels. No sinus wall fracture. Mild mucosal thickening Soft tissues: Negative. IMPRESSION: No CT evidence for acute intracranial abnormality. Atrophy and chronic small vessel ischemic changes of the white matter. No acute facial bone fracture. Electronically Signed   By: Luke Bun M.D.   On: 10/08/2024 17:00   CT Cervical Spine Wo Contrast Result Date: 10/08/2024 CLINICAL DATA:  Neck trauma (Age >= 65y) Patient fell last night.  Facial lacerations. EXAM: CT CERVICAL SPINE WITHOUT CONTRAST TECHNIQUE: Multidetector CT imaging of the cervical spine was performed without intravenous contrast. Multiplanar CT image reconstructions were also generated. RADIATION DOSE REDUCTION: This exam was performed according to the departmental dose-optimization program which includes automated  exposure control, adjustment of the mA and/or kV according to patient size and/or use of iterative reconstruction technique. COMPARISON:  CT cervical spine 06/28/2022 FINDINGS: Alignment: Physiologic. Skull base and vertebrae: No evidence of acute cervical spine fracture or traumatic subluxation. Soft tissues and spinal canal: No prevertebral fluid or swelling. No visible canal hematoma. Disc levels: There is multilevel spondylosis with disc space narrowing, uncinate spurring and facet hypertrophy. There is resulting multilevel spinal stenosis and foraminal narrowing, greatest at C4-5 and C5-6. Upper chest: Clear lung apices. Other: None. IMPRESSION: 1. No evidence of acute cervical spine fracture, traumatic subluxation or static signs of instability. 2. Multilevel cervical spondylosis with resulting multilevel spinal stenosis and foraminal narrowing, greatest at C4-5  and C5-6. Electronically Signed   By: Elsie Perone M.D.   On: 10/08/2024 16:51   DG Foot Complete Right Result Date: 10/08/2024 CLINICAL DATA:  Trauma to the right foot.  Pain. EXAM: RIGHT FOOT COMPLETE - 3+ VIEW COMPARISON:  None Available. FINDINGS: No acute fracture or dislocation. The bones are osteopenic. The soft tissues are unremarkable. IMPRESSION: 1. No acute fracture or dislocation. 2. Osteopenia. Electronically Signed   By: Vanetta Chou M.D.   On: 10/08/2024 16:43   DG Hand Complete Right Result Date: 10/08/2024 CLINICAL DATA:  Trauma to the right hand. EXAM: RIGHT HAND - COMPLETE 3+ VIEW COMPARISON:  Right hand radiograph dated 06/28/2022. FINDINGS: Avulsion fracture of the dorsal base of the distal phalanx of the fifth digit. No dislocation. Old fracture of the ulnar styloid. Partially visualized distal radial fixation hardware. IMPRESSION: Avulsion fracture of the dorsal base of the distal phalanx of the fifth digit. Electronically Signed   By: Vanetta Chou M.D.   On: 10/08/2024 16:42     Suture  Removal  Date/Time: 10/08/2024 5:23 PM  Performed by: Edie Einstein A, PA-C Authorized by: Edie Einstein LABOR, PA-C   Consent:    Consent obtained:  Verbal   Consent given by:  Patient   Risks, benefits, and alternatives were discussed: yes     Risks discussed:  Bleeding, pain and wound separation   Alternatives discussed:  No treatment, delayed treatment, alternative treatment, observation and referral Universal protocol:    Procedure explained and questions answered to patient or proxy's satisfaction: yes     Relevant documents present and verified: yes     Test results available: yes     Imaging studies available: yes     Required blood products, implants, devices, and special equipment available: yes     Site/side marked: yes     Immediately prior to procedure, a time out was called: yes     Patient identity confirmed:  Verbally with patient Location:    Location:  Mouth   Mouth location:  Lower exterior lip Procedure details:    Wound appearance:  No signs of infection, good wound healing and clean   Number of sutures removed:  2   Number of staples removed:  0 Post-procedure details:    Post-removal:  No dressing applied   Procedure completion:  Tolerated well, no immediate complications .Splint Application  Date/Time: 10/08/2024 5:25 PM  Performed by: Edie Einstein LABOR, PA-C Authorized by: Edie Einstein LABOR, PA-C   Consent:    Consent obtained:  Verbal   Consent given by:  Patient   Risks, benefits, and alternatives were discussed: yes     Risks discussed:  Discoloration, numbness, pain and swelling   Alternatives discussed:  No treatment, delayed treatment, alternative treatment, observation and referral Universal protocol:    Procedure explained and questions answered to patient or proxy's satisfaction: yes     Relevant documents present and verified: yes     Test results available: yes     Imaging studies available: yes     Required blood products,  implants, devices, and special equipment available: yes     Site/side marked: yes     Immediately prior to procedure a time out was called: yes     Patient identity confirmed:  Verbally with patient Pre-procedure details:    Distal neurologic exam:  Normal   Distal perfusion: distal pulses strong and brisk capillary refill   Procedure details:    Location:  Finger  Finger location:  R small finger   Strapping: no     Cast type:  Finger   Splint type:  Finger   Supplies:  Prefabricated splint   Attestation: Splint applied and adjusted personally by me   Post-procedure details:    Distal neurologic exam:  Normal   Distal perfusion: distal pulses strong and brisk capillary refill     Procedure completion:  Tolerated well, no immediate complications   Post-procedure imaging: not applicable      Medications Ordered in the ED  Tdap (ADACEL) injection 0.5 mL (has no administration in time range)    83 yo here for eval mechanical fall which occurred yesterday.  She has abrasion to her forehead some right paraspinal cervical tenderness, right little finger pain, right plantar aspect foot pain.  She is ambulatory here.  Neurovascularly intact.  Denies LOC, anticoagulation.  Nontender midline thoracic, lumbar region.  Nontender chest, abdomen.  Will plan on imaging, reassess.  Will update her tetanus.  She does not anything for pain at this time.   Imaging personally viewed and interpreted:  Ct head without acute traumatic injury CT cervical without acute traumatic injury CT max face without acute traumatic injury Xray right hand right fifth digit distal phalanx fracture Xray right foot that acute traumatic injury  Patient reassessed.  Discussed right little finger fracture.  She was placed in a static finger splint.  I was able to remove her to sutures from her lip from a prior visit.  No signs of infection.  Patient ambulatory here.  Tolerating p.o. intake.  Neurovascularly intact.  Will  have her follow-up outpatient with PCP and hand surgery, return for any worsening symptoms.  She does have full range of motion to her finger, no fusiform swelling.  Low suspicion for compartment syndrome, infectious process  The patient has been appropriately medically screened and/or stabilized in the ED. I have low suspicion for any other emergent medical condition which would require further screening, evaluation or treatment in the ED or require inpatient management.  Patient is hemodynamically stable and in no acute distress.  Patient able to ambulate in department prior to ED.  Evaluation does not show acute pathology that would require ongoing or additional emergent interventions while in the emergency department or further inpatient treatment.  I have discussed the diagnosis with the patient and answered all questions.  Pain is been managed while in the emergency department and patient has no further complaints prior to discharge.  Patient is comfortable with plan discussed in room and is stable for discharge at this time.  I have discussed strict return precautions for returning to the emergency department.  Patient was encouraged to follow-up with PCP/specialist refer to at discharge.                                    Medical Decision Making Amount and/or Complexity of Data Reviewed Independent Historian: spouse External Data Reviewed: labs, radiology and notes. Labs: ordered. Decision-making details documented in ED Course. Radiology: ordered and independent interpretation performed. Decision-making details documented in ED Course.  Risk OTC drugs. Prescription drug management. Decision regarding hospitalization. Diagnosis or treatment significantly limited by social determinants of health.       Final diagnoses:  Fall, initial encounter  Visit for suture removal  Closed nondisplaced fracture of distal phalanx of right little finger, initial encounter    ED Discharge  Orders  Ordered    lidocaine  (LIDODERM ) 5 %  Every 24 hours        10/08/24 1722               Nicanor Mendolia A, PA-C 10/08/24 1726    Ruthe Cornet, DO 10/08/24 1726

## 2024-10-08 NOTE — Discharge Instructions (Signed)
 It was a pleasure taking care of you here today  As we discussed a CT scan of your head, neck, face did not show any new findings did show some normal degeneration of the spine.  You do have a fracture in your right pinky finger which we discussed.  We have placed you in a splint.  Make sure to ice, elevate, Tylenol  Motrin.  You may walk as tolerated.  I removed the 2 sutures in your lip.  You can put things like Aquaphor, Vaseline on your lip to help with there is any residual suture  Make sure to follow-up outpatient with primary care provider hand surgery   Return for new or worsening symptoms

## 2024-10-19 ENCOUNTER — Encounter: Payer: Self-pay | Admitting: Radiology

## 2024-10-30 ENCOUNTER — Other Ambulatory Visit: Payer: Self-pay | Admitting: Internal Medicine

## 2024-10-30 DIAGNOSIS — Z1231 Encounter for screening mammogram for malignant neoplasm of breast: Secondary | ICD-10-CM

## 2024-11-30 ENCOUNTER — Inpatient Hospital Stay: Admission: RE | Admit: 2024-11-30 | Discharge: 2024-11-30 | Attending: Internal Medicine | Admitting: Internal Medicine

## 2024-11-30 DIAGNOSIS — Z1231 Encounter for screening mammogram for malignant neoplasm of breast: Secondary | ICD-10-CM
